# Patient Record
Sex: Female | Born: 1950 | Race: White | Hispanic: No | Marital: Married | Smoking: Former smoker
Health system: Southern US, Community
[De-identification: ages and names within clinical notes are randomized; demographics above are authoritative.]

## PROBLEM LIST (undated history)

## (undated) DIAGNOSIS — R112 Nausea with vomiting, unspecified: Secondary | ICD-10-CM

## (undated) DIAGNOSIS — D580 Hereditary spherocytosis: Secondary | ICD-10-CM

## (undated) DIAGNOSIS — J040 Acute laryngitis: Secondary | ICD-10-CM

## (undated) DIAGNOSIS — I10 Essential (primary) hypertension: Secondary | ICD-10-CM

## (undated) DIAGNOSIS — T7840XA Allergy, unspecified, initial encounter: Secondary | ICD-10-CM

## (undated) DIAGNOSIS — M1990 Unspecified osteoarthritis, unspecified site: Secondary | ICD-10-CM

## (undated) DIAGNOSIS — I712 Thoracic aortic aneurysm, without rupture, unspecified: Secondary | ICD-10-CM

## (undated) DIAGNOSIS — Z9889 Other specified postprocedural states: Secondary | ICD-10-CM

## (undated) DIAGNOSIS — K219 Gastro-esophageal reflux disease without esophagitis: Secondary | ICD-10-CM

## (undated) DIAGNOSIS — R29898 Other symptoms and signs involving the musculoskeletal system: Secondary | ICD-10-CM

## (undated) DIAGNOSIS — G709 Myoneural disorder, unspecified: Secondary | ICD-10-CM

## (undated) DIAGNOSIS — N200 Calculus of kidney: Secondary | ICD-10-CM

## (undated) DIAGNOSIS — K5190 Ulcerative colitis, unspecified, without complications: Secondary | ICD-10-CM

## (undated) DIAGNOSIS — K921 Melena: Secondary | ICD-10-CM

## (undated) DIAGNOSIS — I351 Nonrheumatic aortic (valve) insufficiency: Secondary | ICD-10-CM

## (undated) DIAGNOSIS — M659 Synovitis and tenosynovitis, unspecified: Secondary | ICD-10-CM

## (undated) DIAGNOSIS — R011 Cardiac murmur, unspecified: Secondary | ICD-10-CM

## (undated) DIAGNOSIS — G454 Transient global amnesia: Secondary | ICD-10-CM

## (undated) DIAGNOSIS — E785 Hyperlipidemia, unspecified: Secondary | ICD-10-CM

## (undated) DIAGNOSIS — I639 Cerebral infarction, unspecified: Secondary | ICD-10-CM

## (undated) DIAGNOSIS — C4491 Basal cell carcinoma of skin, unspecified: Secondary | ICD-10-CM

## (undated) DIAGNOSIS — G4730 Sleep apnea, unspecified: Secondary | ICD-10-CM

## (undated) HISTORY — PX: SPLENECTOMY, TOTAL: SHX788

## (undated) HISTORY — DX: Sleep apnea, unspecified: G47.30

## (undated) HISTORY — DX: Other symptoms and signs involving the musculoskeletal system: R29.898

## (undated) HISTORY — DX: Hyperlipidemia, unspecified: E78.5

## (undated) HISTORY — DX: Basal cell carcinoma of skin, unspecified: C44.91

## (undated) HISTORY — PX: OTHER SURGICAL HISTORY: SHX169

## (undated) HISTORY — DX: Cardiac murmur, unspecified: R01.1

## (undated) HISTORY — DX: Acute laryngitis: J04.0

## (undated) HISTORY — DX: Allergy, unspecified, initial encounter: T78.40XA

## (undated) HISTORY — DX: Calculus of kidney: N20.0

## (undated) HISTORY — DX: Other specified postprocedural states: Z98.890

## (undated) HISTORY — DX: Ulcerative colitis, unspecified, without complications: K51.90

## (undated) HISTORY — DX: Melena: K92.1

## (undated) HISTORY — DX: Nausea with vomiting, unspecified: R11.2

## (undated) HISTORY — DX: Gastro-esophageal reflux disease without esophagitis: K21.9

## (undated) HISTORY — DX: Transient global amnesia: G45.4

---

## 1999-04-11 ENCOUNTER — Other Ambulatory Visit: Admission: RE | Admit: 1999-04-11 | Discharge: 1999-04-11 | Payer: Self-pay | Admitting: Obstetrics & Gynecology

## 1999-07-08 HISTORY — PX: TOTAL KNEE ARTHROPLASTY: SHX125

## 2000-07-28 ENCOUNTER — Other Ambulatory Visit: Admission: RE | Admit: 2000-07-28 | Discharge: 2000-07-28 | Payer: Self-pay | Admitting: Obstetrics & Gynecology

## 2002-03-03 ENCOUNTER — Other Ambulatory Visit: Admission: RE | Admit: 2002-03-03 | Discharge: 2002-03-03 | Payer: Self-pay | Admitting: Obstetrics & Gynecology

## 2002-11-16 ENCOUNTER — Encounter: Payer: Self-pay | Admitting: Internal Medicine

## 2002-11-16 ENCOUNTER — Encounter: Admission: RE | Admit: 2002-11-16 | Discharge: 2002-11-16 | Payer: Self-pay | Admitting: Internal Medicine

## 2004-02-01 ENCOUNTER — Other Ambulatory Visit: Admission: RE | Admit: 2004-02-01 | Discharge: 2004-02-01 | Payer: Self-pay | Admitting: Obstetrics & Gynecology

## 2004-04-24 ENCOUNTER — Emergency Department (HOSPITAL_COMMUNITY): Admission: EM | Admit: 2004-04-24 | Discharge: 2004-04-24 | Payer: Self-pay | Admitting: Emergency Medicine

## 2004-04-29 ENCOUNTER — Ambulatory Visit (HOSPITAL_COMMUNITY): Admission: RE | Admit: 2004-04-29 | Discharge: 2004-04-29 | Payer: Self-pay | Admitting: Emergency Medicine

## 2004-05-09 ENCOUNTER — Ambulatory Visit: Payer: Self-pay

## 2004-06-06 ENCOUNTER — Ambulatory Visit: Payer: Self-pay | Admitting: Internal Medicine

## 2004-06-20 ENCOUNTER — Ambulatory Visit: Payer: Self-pay | Admitting: Internal Medicine

## 2005-02-13 ENCOUNTER — Encounter: Admission: RE | Admit: 2005-02-13 | Discharge: 2005-02-13 | Payer: Self-pay | Admitting: Internal Medicine

## 2005-06-19 ENCOUNTER — Other Ambulatory Visit: Admission: RE | Admit: 2005-06-19 | Discharge: 2005-06-19 | Payer: Self-pay | Admitting: Obstetrics & Gynecology

## 2006-03-18 ENCOUNTER — Ambulatory Visit: Payer: Self-pay | Admitting: Cardiovascular Disease

## 2006-03-26 ENCOUNTER — Ambulatory Visit: Payer: Self-pay

## 2006-03-31 ENCOUNTER — Ambulatory Visit: Payer: Self-pay | Admitting: Cardiovascular Disease

## 2006-06-01 ENCOUNTER — Inpatient Hospital Stay (HOSPITAL_COMMUNITY): Admission: RE | Admit: 2006-06-01 | Discharge: 2006-06-04 | Payer: Self-pay | Admitting: Orthopedic Surgery

## 2007-07-08 DIAGNOSIS — G454 Transient global amnesia: Secondary | ICD-10-CM

## 2007-07-08 HISTORY — DX: Transient global amnesia: G45.4

## 2007-09-21 ENCOUNTER — Ambulatory Visit: Payer: Self-pay | Admitting: Cardiology

## 2007-09-21 ENCOUNTER — Ambulatory Visit: Payer: Self-pay | Admitting: Surgery

## 2007-09-21 ENCOUNTER — Encounter (INDEPENDENT_AMBULATORY_CARE_PROVIDER_SITE_OTHER): Payer: Self-pay | Admitting: Internal Medicine

## 2007-09-21 ENCOUNTER — Inpatient Hospital Stay (HOSPITAL_COMMUNITY): Admission: EM | Admit: 2007-09-21 | Discharge: 2007-09-24 | Payer: Self-pay | Admitting: Emergency Medicine

## 2007-09-21 DIAGNOSIS — I639 Cerebral infarction, unspecified: Secondary | ICD-10-CM

## 2007-09-21 HISTORY — DX: Cerebral infarction, unspecified: I63.9

## 2007-09-23 ENCOUNTER — Encounter: Payer: Self-pay | Admitting: Cardiology

## 2007-09-27 ENCOUNTER — Ambulatory Visit: Payer: Self-pay | Admitting: Cardiology

## 2007-12-18 ENCOUNTER — Ambulatory Visit: Payer: Self-pay | Admitting: Internal Medicine

## 2007-12-18 ENCOUNTER — Inpatient Hospital Stay (HOSPITAL_COMMUNITY): Admission: EM | Admit: 2007-12-18 | Discharge: 2007-12-21 | Payer: Self-pay | Admitting: Emergency Medicine

## 2007-12-19 ENCOUNTER — Encounter (INDEPENDENT_AMBULATORY_CARE_PROVIDER_SITE_OTHER): Payer: Self-pay | Admitting: Gastroenterology

## 2007-12-19 ENCOUNTER — Encounter (INDEPENDENT_AMBULATORY_CARE_PROVIDER_SITE_OTHER): Payer: Self-pay | Admitting: *Deleted

## 2007-12-20 ENCOUNTER — Encounter (INDEPENDENT_AMBULATORY_CARE_PROVIDER_SITE_OTHER): Payer: Self-pay | Admitting: *Deleted

## 2007-12-21 ENCOUNTER — Encounter (INDEPENDENT_AMBULATORY_CARE_PROVIDER_SITE_OTHER): Payer: Self-pay | Admitting: *Deleted

## 2008-05-03 ENCOUNTER — Ambulatory Visit: Payer: Self-pay | Admitting: General Practice

## 2008-07-07 HISTORY — PX: SHOULDER ARTHROSCOPY W/ ROTATOR CUFF REPAIR: SHX2400

## 2008-11-02 ENCOUNTER — Inpatient Hospital Stay (HOSPITAL_COMMUNITY): Admission: RE | Admit: 2008-11-02 | Discharge: 2008-11-03 | Payer: Self-pay | Admitting: Orthopedic Surgery

## 2009-03-13 ENCOUNTER — Telehealth: Payer: Self-pay | Admitting: Internal Medicine

## 2009-03-20 DIAGNOSIS — E785 Hyperlipidemia, unspecified: Secondary | ICD-10-CM

## 2009-03-20 DIAGNOSIS — E782 Mixed hyperlipidemia: Secondary | ICD-10-CM | POA: Insufficient documentation

## 2009-03-20 DIAGNOSIS — K922 Gastrointestinal hemorrhage, unspecified: Secondary | ICD-10-CM | POA: Insufficient documentation

## 2009-03-20 DIAGNOSIS — D72829 Elevated white blood cell count, unspecified: Secondary | ICD-10-CM | POA: Insufficient documentation

## 2009-03-20 DIAGNOSIS — I6350 Cerebral infarction due to unspecified occlusion or stenosis of unspecified cerebral artery: Secondary | ICD-10-CM | POA: Insufficient documentation

## 2009-03-20 DIAGNOSIS — I10 Essential (primary) hypertension: Secondary | ICD-10-CM | POA: Insufficient documentation

## 2009-03-20 DIAGNOSIS — K5730 Diverticulosis of large intestine without perforation or abscess without bleeding: Secondary | ICD-10-CM | POA: Insufficient documentation

## 2009-03-21 ENCOUNTER — Ambulatory Visit: Payer: Self-pay | Admitting: Internal Medicine

## 2009-03-21 DIAGNOSIS — R109 Unspecified abdominal pain: Secondary | ICD-10-CM | POA: Insufficient documentation

## 2009-03-21 LAB — CONVERTED CEMR LAB
AST: 22 units/L (ref 0–37)
Alkaline Phosphatase: 96 units/L (ref 39–117)
Amylase: 74 units/L (ref 27–131)
Basophils Relative: 0.5 % (ref 0.0–3.0)
Eosinophils Absolute: 0.6 10*3/uL (ref 0.0–0.7)
HCT: 42.5 % (ref 36.0–46.0)
Lipase: 15 units/L (ref 11.0–59.0)
Lymphs Abs: 3 10*3/uL (ref 0.7–4.0)
MCV: 91.1 fL (ref 78.0–100.0)
Monocytes Absolute: 1.3 10*3/uL — ABNORMAL HIGH (ref 0.1–1.0)
Monocytes Relative: 12.8 % — ABNORMAL HIGH (ref 3.0–12.0)
Neutro Abs: 5.1 10*3/uL (ref 1.4–7.7)
Platelets: 428 10*3/uL — ABNORMAL HIGH (ref 150.0–400.0)
Total Bilirubin: 1.7 mg/dL — ABNORMAL HIGH (ref 0.3–1.2)
WBC: 10.1 10*3/uL (ref 4.5–10.5)

## 2009-04-04 ENCOUNTER — Ambulatory Visit (HOSPITAL_COMMUNITY): Admission: RE | Admit: 2009-04-04 | Discharge: 2009-04-04 | Payer: Self-pay | Admitting: Internal Medicine

## 2009-04-18 ENCOUNTER — Ambulatory Visit (HOSPITAL_COMMUNITY): Admission: RE | Admit: 2009-04-18 | Discharge: 2009-04-18 | Payer: Self-pay | Admitting: Internal Medicine

## 2009-07-07 ENCOUNTER — Observation Stay (HOSPITAL_COMMUNITY): Admission: EM | Admit: 2009-07-07 | Discharge: 2009-07-08 | Payer: Self-pay | Admitting: Emergency Medicine

## 2009-07-07 ENCOUNTER — Encounter (INDEPENDENT_AMBULATORY_CARE_PROVIDER_SITE_OTHER): Payer: Self-pay | Admitting: Internal Medicine

## 2009-07-16 ENCOUNTER — Ambulatory Visit: Payer: Self-pay

## 2009-07-16 ENCOUNTER — Encounter: Payer: Self-pay | Admitting: Cardiovascular Disease

## 2009-07-16 ENCOUNTER — Ambulatory Visit (HOSPITAL_COMMUNITY): Admission: RE | Admit: 2009-07-16 | Discharge: 2009-07-16 | Payer: Self-pay | Admitting: Cardiovascular Disease

## 2009-07-16 ENCOUNTER — Ambulatory Visit: Payer: Self-pay | Admitting: Cardiology

## 2009-07-18 ENCOUNTER — Telehealth: Payer: Self-pay | Admitting: Cardiovascular Disease

## 2009-07-18 DIAGNOSIS — I059 Rheumatic mitral valve disease, unspecified: Secondary | ICD-10-CM | POA: Insufficient documentation

## 2009-07-19 DIAGNOSIS — R51 Headache: Secondary | ICD-10-CM

## 2009-07-19 DIAGNOSIS — D649 Anemia, unspecified: Secondary | ICD-10-CM | POA: Insufficient documentation

## 2009-07-19 DIAGNOSIS — R413 Other amnesia: Secondary | ICD-10-CM | POA: Insufficient documentation

## 2009-07-19 DIAGNOSIS — R519 Headache, unspecified: Secondary | ICD-10-CM | POA: Insufficient documentation

## 2009-07-19 DIAGNOSIS — D580 Hereditary spherocytosis: Secondary | ICD-10-CM

## 2009-07-19 DIAGNOSIS — K219 Gastro-esophageal reflux disease without esophagitis: Secondary | ICD-10-CM | POA: Insufficient documentation

## 2009-07-23 ENCOUNTER — Ambulatory Visit: Payer: Self-pay | Admitting: Cardiovascular Disease

## 2010-02-15 IMAGING — NM NM HEPATO W/GB/PHARM/[PERSON_NAME]
2 series · 12 of 12 positions shown · non-contrast
Comparison: Ultrasound 04/04/2001

CLINICAL DATA: History of abdominal pain.

NUCLEAR MEDICINE HEPATOBILIARY IMAGING WITH GALLBLADDER EF
TECHNIQUE: Sequential images of the abdomen were obtained [DATE]
minutes following intravenous administration of
radiopharmaceutical. After slow intravenous infusion of 1.4 uCg
Cholecystokinin, gallbladder ejection fraction was determined.
Radiopharmaceutical: 5.3 mCi 4c-22m Choletec

[Series 1: he hepato · 4.71mm/px · 6 of 30 frames shown (1 of 2)]
[frame 3/30]
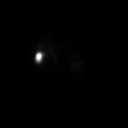
[frame 8/30]
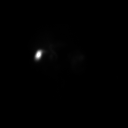
[frame 13/30]
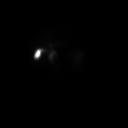
[frame 18/30]
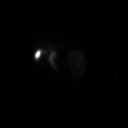
[frame 23/30]
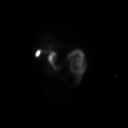
[frame 28/30]
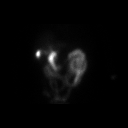

[Series 1: he hepato · 4.71mm/px · 6 of 60 frames shown (2 of 2)]
[frame 6/60]
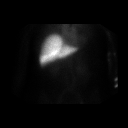
[frame 16/60]
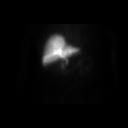
[frame 26/60]
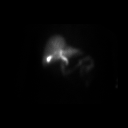
[frame 36/60]
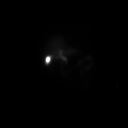
[frame 46/60]
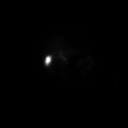
[frame 56/60]
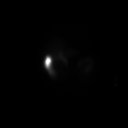

[12 of 12 positions shown; findings below may reference images not displayed]

FINDINGS: There is prompt visualization of hepatic activity. There
is prompt visualization of the common bile duct. Subsequently
intestinal activity was identified.

The gallbladder began to visualize at 25 minutes.

During CCK infusion the gallbladder contracted 89%. 30% or greater
is normal range.

During CCK infusion the patient reported experiencing mild
abdominal pain and burning.
IMPRESSION: There is demonstration of patency of the common bile duct and the
cystic duct. There is no evidence of cholecystitis.

During CCK infusion the gallbladder contracted 89%. 30% or greater
is normal range.

During CCK infusion the patient reported experiencing mild
abdominal pain and burning.

## 2010-08-06 NOTE — Progress Notes (Signed)
Summary: PT RETURNING CALL  Phone Note Call from Patient Call back at [facility] Phone [phone]   Caller: Patient Summary of Call: PT RETURNING CALL. Initial call taken by: Delsa Sale,  July 18, 2009 1:09 PM  Follow-up for Phone Call        PT AWARE OF ECHO RESULTS. Follow-up by: Devra Dopp, LPN,  July 19, 2955 1:23 PM

## 2010-08-06 NOTE — Assessment & Plan Note (Signed)
Summary: EPH   Visit Type:  Post-hospital Primary Provider:  Jeremy Johann, MD  CC:  dizziness- nausea.  History of Present Illness: 60 year-old woman with history of mitral valve prolapse presents for hospital follow-up evaluation today. She was recently hospitalized with an episode of Transient Global Amnesia. Imaging studies showed no acute ischemic events. She has had no recent cardiac symptoms. Specifically denies chest pain, edema, palps, or syncope. She reports chronic exertional dyspnea. No other complaints today. She is anxious to return to work but is waiting for neurologic clearance. - Current Medications (verified): 1)  Aspirin 81 Mg Tbec (Aspirin) .... Once Daily 2)  Panothetic Acid 524m .... Once Daily 3)  Metoprolol Succinate 50 Mg Xr24h-Tab (Metoprolol Succinate) .... Take One Tablet By Mouth Daily 4)  Vitamin B-1 250 Mg Tabs (Thiamine Hcl) .... Take 1 Tablet By Mouth Once A Day  Allergies: 1)  ! Codeine  Past History:  Past medical history reviewed for relevance to current acute and chronic problems.  Past Medical History: Reviewed history from 03/20/2009 and no changes required. Current Problems:  Hx of CVA (STROKE) (ICD-434.91) LEUKOCYTOSIS (ICD-288.60) HYPERLIPIDEMIA (ICD-272.4) HYPERTENSION (ICD-401.9) DIVERTICULOSIS, COLON (ICD-562.10) Hx of GI BLEEDING (ICD-578.9) GASTRITIS, CHRONIC (ICD-535.10) HIATAL HERNIA (ICD-553.3)    Review of Systems       Negative except as per HPI   Vital Signs:  Patient profile:   60year old female Height:      64 inches Weight:      167.50 pounds BMI:     28.86 Pulse rate:   58 / minute Pulse rhythm:   irregular Resp:     18 per minute BP sitting:   140 / 84  (left arm) Cuff size:   large  Vitals Entered By: LSidney Ace(July 23, 2009 10:01 AM)  Physical Exam  General:  Pt is alert and oriented, in no acute distress. HEENT: normal Neck: normal carotid upstrokes without bruits, JVP normal Lungs:  CTA CV: RRR without murmur or gallop Abd: soft, NT, positive BS, no bruit, no organomegaly Ext: no clubbing, cyanosis, or edema. peripheral pulses 2+ and equal Skin: warm and dry without rash    EKG  Procedure date:  07/23/2009  Findings:      Sinus bradycardia, low voltage, nonspecific Twave abnormality with diffuse T wave flattening.  Impression & Recommendations:  Problem # 1:  MITRAL VALVE PROLAPSE (ICD-424.0) Recent echo reviewed and showed MVP without regurgitation. LV function is normal and no other valvular abnormalities are noted. Continue beta blockade.  The following medications were removed from the medication list:    Metoprolol Succinate 100 Mg Xr24h-tab (Metoprolol succinate) ..... Once daily Her updated medication list for this problem includes:    Metoprolol Succinate 50 Mg Xr24h-tab (Metoprolol succinate) ..Marland Kitchen.. Take one tablet by mouth daily  Problem # 2:  HYPERTENSION (ICD-401.9) continue metoprolol.  The following medications were removed from the medication list:    Metoprolol Succinate 100 Mg Xr24h-tab (Metoprolol succinate) ..... Once daily Her updated medication list for this problem includes:    Aspirin 81 Mg Tbec (Aspirin) ..... Once daily    Metoprolol Succinate 50 Mg Xr24h-tab (Metoprolol succinate) ..Marland Kitchen.. Take one tablet by mouth daily  BP today: 140/84 Prior BP: 138/90 (03/21/2009)  Patient Instructions: 1)  Your physician recommends that you schedule a follow-up appointment in:  follow up as needed

## 2010-09-22 LAB — DIFFERENTIAL
Basophils Absolute: 0.1 10*3/uL (ref 0.0–0.1)
Basophils Relative: 1 % (ref 0–1)
Eosinophils Absolute: 0.3 10*3/uL (ref 0.0–0.7)
Neutro Abs: 8.3 10*3/uL — ABNORMAL HIGH (ref 1.7–7.7)

## 2010-09-22 LAB — CK TOTAL AND CKMB (NOT AT ARMC)
Relative Index: 1.7 (ref 0.0–2.5)
Total CK: 126 U/L (ref 7–177)

## 2010-09-22 LAB — CBC: MCV: 92.4 fL (ref 78.0–100.0)

## 2010-09-22 LAB — CARDIAC PANEL(CRET KIN+CKTOT+MB+TROPI)
CK, MB: 1.3 ng/mL (ref 0.3–4.0)
CK, MB: 1.8 ng/mL (ref 0.3–4.0)
Relative Index: 1.6 (ref 0.0–2.5)
Total CK: 95 U/L (ref 7–177)
Troponin I: 0.01 ng/mL (ref 0.00–0.06)

## 2010-09-22 LAB — TROPONIN I: Troponin I: 0.03 ng/mL (ref 0.00–0.06)

## 2010-09-22 LAB — COMPREHENSIVE METABOLIC PANEL
ALT: 22 U/L (ref 0–35)
Albumin: 3.8 g/dL (ref 3.5–5.2)
Alkaline Phosphatase: 76 U/L (ref 39–117)
Chloride: 105 mEq/L (ref 96–112)
Creatinine, Ser: 0.68 mg/dL (ref 0.4–1.2)
GFR calc Af Amer: >60 mL/min (ref >60)
Glucose, Bld: 99 mg/dL (ref 70–99)
Sodium: 138 mEq/L (ref 135–145)
Total Bilirubin: 0.9 mg/dL (ref 0.3–1.2)

## 2010-10-16 LAB — COMPREHENSIVE METABOLIC PANEL
ALT: 18 U/L (ref 0–35)
Alkaline Phosphatase: 91 U/L (ref 39–117)
BUN: 10 mg/dL (ref 6–23)
CO2: 31 mEq/L (ref 19–32)
GFR calc non Af Amer: >60 mL/min (ref >60)
Glucose, Bld: 90 mg/dL (ref 70–99)
Potassium: 5.3 mEq/L — ABNORMAL HIGH (ref 3.5–5.1)
Sodium: 142 mEq/L (ref 135–145)
Total Bilirubin: 0.9 mg/dL (ref 0.3–1.2)

## 2010-10-16 LAB — URINE CULTURE: Special Requests: NEGATIVE

## 2010-10-16 LAB — BASIC METABOLIC PANEL
CO2: 29 mEq/L (ref 19–32)
Chloride: 99 mEq/L (ref 96–112)
GFR calc Af Amer: >60 mL/min (ref >60)
Sodium: 133 mEq/L — ABNORMAL LOW (ref 135–145)

## 2010-10-16 LAB — URINE MICROSCOPIC-ADD ON

## 2010-10-16 LAB — CBC
HCT: 42 % (ref 36.0–46.0)
Hemoglobin: 13.4 g/dL (ref 12.0–15.0)
Hemoglobin: 15.1 g/dL — ABNORMAL HIGH (ref 12.0–15.0)
MCHC: 35.7 g/dL (ref 30.0–36.0)
MCV: 93.2 fL (ref 78.0–100.0)
RBC: 4.03 MIL/uL (ref 3.87–5.11)
RBC: 4.52 MIL/uL (ref 3.87–5.11)

## 2010-10-16 LAB — DIFFERENTIAL
Basophils Absolute: 0.2 10*3/uL — ABNORMAL HIGH (ref 0.0–0.1)
Eosinophils Absolute: 0.4 10*3/uL (ref 0.0–0.7)
Lymphocytes Relative: 33 % (ref 12–46)
Monocytes Absolute: 0.8 10*3/uL (ref 0.1–1.0)
Neutrophils Relative %: 49 % (ref 43–77)

## 2010-10-16 LAB — URINALYSIS, ROUTINE W REFLEX MICROSCOPIC
Ketones, ur: NEGATIVE mg/dL
Nitrite: NEGATIVE
Specific Gravity, Urine: 1.004 — ABNORMAL LOW (ref 1.005–1.030)
pH: 7 (ref 5.0–8.0)

## 2010-10-16 LAB — PROTIME-INR: Prothrombin Time: 13.5 seconds (ref 11.6–15.2)

## 2010-10-16 LAB — CROSSMATCH

## 2010-10-22 ENCOUNTER — Other Ambulatory Visit: Payer: Self-pay | Admitting: Internal Medicine

## 2010-11-04 ENCOUNTER — Other Ambulatory Visit: Payer: Self-pay | Admitting: Otolaryngology

## 2010-11-04 ENCOUNTER — Ambulatory Visit
Admission: RE | Admit: 2010-11-04 | Discharge: 2010-11-04 | Disposition: A | Discharge status: Home / Self Care | Payer: BC Managed Care – PPO | Source: Ambulatory Visit | Attending: Otolaryngology | Admitting: Otolaryngology

## 2010-11-04 DIAGNOSIS — J40 Bronchitis, not specified as acute or chronic: Secondary | ICD-10-CM

## 2010-11-19 NOTE — Discharge Summary (Signed)
NAMEELSBETH, YEARICK               ACCOUNT NO.:  [id number]   MEDICAL RECORD NO.:  [id number]          PATIENT TYPE:  INP   LOCATION:  3159                         FACILITY:  [facility]   PHYSICIAN:  Farris Has, MDDATE OF BIRTH:  1951/02/24   DATE OF ADMISSION:  12/18/2007  DATE OF DISCHARGE:  12/21/2007                               DISCHARGE SUMMARY   CONSULTATION:  Jeryl Columbia, M.D. with GI.   DISCHARGE DIAGNOSES:  1. Upper gastrointestinal bleed secondary to nonsteroidal      antiinflammatory drug-induced gastric ulcer.  2. Chronic headache.  3. Hypertension.  4. History of cerebrovascular accident.  5. Hyperlipidemia.  6. Leucocytosis.   STUDIES:  The patient had upper endoscopy performed on December 09, 2007,  showing a small hiatal hernia and few antral ulcers which were sent down  for H. pylori but this result was not back at this point.   HOSPITAL COURSE:  The patient is a pleasant 60 year old female with  history of CVA on Plavix and also was taking NSAID for headache.  The  patient was admitted with hematemesis by [facility], and there she was placed  on telemetry and hydrated.  GI consult called.  ECG performed on October 11, 2007, showed __________ .  She was placed on Protonix 40 mg p.o.  b.i.d.  Follow up with Dr. Watt Climes in about 1 month.  Of note, the patient  was also noted to have headache which was thought to be secondary  __________  versus a chronic headache.  The patient was given Ultram  with great relief and at this point is headache free.  The patient is to  follow up by her primary care doctor for further care.  The patient to  avoid using Excedrin because it contains aspirin.   Anemia likely secondary to upper GI bleed.  We will repeat CBC as an  outpatient.  Call results to Dr. Tamala Julian.  We will discharge her on  ferrous gluconate.   Hypertension:  The patient was normotensive or a low blood pressure  while in-house.  We will decrease her Toprol to 25 mg  p.o. daily.   Hyperlipidemia.  Continue her Lipitor.   Leukocytosis:  At the time of discharge, the patient was found to have  mild leukocytosis at 11.4.  The patient was asymptomatic.  Did not show  absolutely any signs of infection.  We will repeat CBC in about 1 week  or asked the patient to present to primary care for followup.  The  patient is asymptomatic.   Hyperkalemia:  The patient has had her potassium replaced while in-house  because of  hyperkalemia with fluid rehydration.   DISCHARGE MEDICATIONS:  1. Toprol 25 mg p.o. daily.  2. Ferrous gluconate 325 mg p.o. b.i.d.  3. Colace 100 mg p.o. b.i.d.  4. Ultram 50 mg p.o. q.6 h as needed for pain.  5. Protonix 40 mg p.o. b.i.d.   The patient is to continue B1 and B12 and to continue her potassium  supplements.      Farris Has, MD  Electronically Signed  AVD/MEDQ  D:  12/21/2007  T:  12/22/2007  Job:  [id number]   cc:   Judithann Sauger, M.D.  Jeryl Columbia, M.D.

## 2010-11-19 NOTE — Consult Note (Signed)
Hannah Willis, Hannah Willis               ACCOUNT NO.:  [id number]   MEDICAL RECORD NO.:  [id number]          PATIENT TYPE:  INP   LOCATION:  3106                         FACILITY:  [facility]   PHYSICIAN:  Legrand Como L. Reynolds, M.D.DATE OF BIRTH:  [birth year]   DATE OF CONSULTATION:  09/21/2007  DATE OF DISCHARGE:                                 CONSULTATION   REASON FOR CONSULTATION:  Right arm weakness, question TIA/CVA.   HISTORY OF PRESENT ILLNESS:  This is a 60 year old white woman with a  history of vitamin D deficiency and hypertension, who presents with  acute onset of right arm weakness and paresthesia that began at 2 a.m.  She was last normal at 10 p.m. when she went to bed last night.  She  woke up and noticed that she had numbness and a burning sensation over  her entire right arm.  An EMT neighbor took the blood pressure, which  was found to be 180/120 and urged her to come into the emergency  department for evaluation.   ALLERGIES:  CODEINE.   PAST MEDICAL HISTORY:  1. Hypertension.  2. Prior right basal ganglia stroke, of which the patient was unaware.  3. History of spherocytosis.   HOME MEDICATIONS:  1. Calcium.  2. Vitamin D tablets.  3. Toprol XL 50 mg twice daily.   CURRENT MEDICATIONS:  1. Aspirin 325 mg daily.  2. Lovenox 40 mg daily.  3. Toprol 50 mg  twice daily.  4. Tylenol.  5. Ativan.  6. Maalox.  7. Senokot.   SOCIAL HISTORY:  She lives with her husband and is a former smoker.  Quit 18 years ago.  Denies any alcohol or illicit drug use. 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   FAMILY HISTORY:  Father with hypertension and diabetes.  Mother just  died yesterday of a stroke.   REVIEW OF SYSTEMS:  She denies any headache, vertigo, diplopia,  dizziness, chest pain, shortness of breath, abdominal pain, dysuria,  melena or hematochezia.   PHYSICAL EXAMINATION:  VITAL SIGNS:  Blood pressure 118/60, temperature  97.4 degrees, heart  rate 65, respirations 18, O2 saturation 98% on room  air.  GENERAL:  Alert, awake, oriented x3, in no acute distress.  HEENT/NECK:  No bruits or goiter.  CARDIOVASCULAR:  A regular rate and rhythm with no murmurs, rubs or  gallops.  LUNGS:  Clear to auscultation bilaterally.  ABDOMEN:  Soft, nontender, nondistended, with positive bowel sounds.  EXTREMITIES:  No edema.  Positive pulses.  NEUROLOGIC:  Mental status intact.  Cranial nerves II-XII  intact.  Muscle strength 5/5 in the left upper extremity, left lower extremity,  right lower extremity; however, she has decreased grip strength on the  right.  She has her right breast stroke.  She has inability to extend  her right fourth and fifth fingers.  She has good sensation to pinprick  over all 10 fingertips.  She has intact triceps and biceps reflex on the  right, slightly decreased brachial radialis reflex on the right.  She  cannot abduct her right  shoulder to greater than 90 degrees.  She has a  right ankle boot, secondary to stress fracture.  Finger-to-nose was  normal on the left, difficulty on the right, given weakness.   LABORATORY DATA:  Sodium 136, potassium 3.7, chloride 104, bicarbonate  24, BUN 14, creatinine 0.74, glucose 114.  WBCs 8.8, hemoglobin 13.4,  platelets 358.  INR 1, PTT 34.  AST 17, ALT 13, total protein 6.3,  albumin 3.5, calcium 8.9.  CK 130, CK-MB 1.9, troponin 0.01.  Cholesterol 178, triglycerides 51, HDL 54, LDL slightly elevated at 114.   Chest x-ray with no active disease.  A CT of the head with no acute  intracranial abnormalities.  No acute infarction.  Chronic right basilar  ganglia fresh lacunar CVA.  MRI shows acute left parietal CVA.   A 2-D echocardiogram is pending as well as carotid Dopplers.   ASSESSMENT/PLAN:  This is a 60 year old white woman with a history of  hypertension and previous cerebrovascular accident, presenting with new  onset right upper extremity weakness:  I agree with the  stroke workup  including carotid Dopplers and 2-D echocardiogram, given prior  cerebrovascular accident.  Will start her on a statin with a goal LDL of  less than 70.  Agree with aspirin as well, given that she was not  previously on it.  She will need PT and OT.  We will discharge her out  of the neuro intensive care unit and this will be dictated by her rehab.      Domingo Mend, M.D.  Electronically Signed      Audrie Lia. Doy Mince, M.D.  Electronically Signed    EH/MEDQ  D:  09/21/2007  T:  09/22/2007  Job:  [id number]

## 2010-11-19 NOTE — Op Note (Signed)
Hannah Willis, Hannah Willis               ACCOUNT NO.:  [id number]   MEDICAL RECORD NO.:  [id number]          PATIENT TYPE:  INP   LOCATION:  [place]                         FACILITY:  [facility]   PHYSICIAN:  John L. Rendall, M.D.  DATE OF BIRTH:  03/29/1951   DATE OF PROCEDURE:  11/02/2008  DATE OF DISCHARGE:                               OPERATIVE REPORT   PREOPERATIVE DIAGNOSIS:  Osteoarthritis right shoulder.   POSTOPERATIVE DIAGNOSIS:  Osteoarthritis right shoulder.   SURGICAL PROCEDURES:  Hemiarthroplasty right shoulder.   SURGEON:  John L. Rendall, M.D.   ASSISTANT:  Vonita Moss. Duffy, P.A. present and participating in the entire  procedure.   ANESTHESIA:  General with scalene block.   PATHOLOGY:  The patient has osteoarthritis with deformity of the humeral  head, areas of hyaline cartilage loss, significant spurring along the  anterior and inferior margin, acetabulum was relatively unscathed by the  arthritis and the labrum and rotator cuff appear intact.   PROCEDURE IN DETAIL:  Under general anesthesia with a scalene block the  right shoulder was prepped and draped in a semi-sitting position on the  shoulder frame table.  The anatomic landmarks were identified with  marking pen and an incision from the region of the coracoid to the  deltoid insertion was made.  Dissection was carried through skin and  subcutaneous tissue exposing the deltoid and pectoral muscle bellies and  the interval between them.  The cephalic vein is found and dissected  along the lateral border.  Dissection is carried down to the  clavipectoral fascia and the deltoid is then swept anteriorly and  laterally off the clavipectoral fascia.  Scoville retractors are  inserted under the deltoid and under the short head of the biceps and  coracobrachialis.  At this point the clavipectoral fascia was taken down  on the front of the subscapularis and the muscle and tendon and capsule  are taken in one layer a centimeter  from insertion on the humerus and  taken down with electrocautery and tagged with stitches.  The humeral  head was then dislocated anteriorly.  Using a guide with 30 degrees of  retroversion the humeral head was then osteotomized at its base and  spurs were then taken off the remaining stump of the humeral neck area.  Once this was completed reaming of the humerus was done with 6,8, 10 and  12 and the 12 gives excellent fit.  Rasping was then done with 8, 10 and  12.  Trialing of different shoulder balls are then done and the optimal  size was a 44 x 21 eccentric head.  This was found to have anterior-  posterior mobility of about 50% but appeared stable in the shoulder and  at this point permanent components were obtained.  #1 Tycron sutures  were placed on the medial humeral neck and the permanent [employer]  Advantage stem standard size 12 was inserted down the canal.  It was an  excellent fit with the eccentric head.  The articular surface was  covered nicely and the shoulder was then reduced.  The subscapularis was  then drawn out with its tag sutures and the four sutures placed in the  humeral neck area through bone are then all tied and the interval was  closed.  Gentle motion reveals slight stiffness  in external rotation at 25-30 degrees but otherwise excellent mobility.  The deltopectoral interval was loosely closed with zero Vicryl, subcu  with 0 and 2-0 Vicryl and skin with clips.  Operative time under 1 hour.  The patient tolerated the procedure well and returned to recovery in  good condition.  Blood loss probably only 150 mL.      John L. Rendall, M.D.  Electronically Signed     JLR/MEDQ  D:  11/02/2008  T:  11/02/2008  Job:  [id number]

## 2010-11-19 NOTE — Discharge Summary (Signed)
Hannah Willis, Hannah Willis               ACCOUNT NO.:  [id number]   MEDICAL RECORD NO.:  [id number]          PATIENT TYPE:  INP   LOCATION:  3020                         FACILITY:  [facility]   PHYSICIAN:  Sheila Oats, M.D.DATE OF BIRTH:  1951-05-22   DATE OF ADMISSION:  09/21/2007  DATE OF DISCHARGE:  09/24/2007                               DISCHARGE SUMMARY   DISCHARGE DIAGNOSES:  1. 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  2. Hypertension.  3. History of spherocytosis.  4. Status post splenectomy.  5. History of chronic headaches.  6. History of left knee replacement.  7. Dyslipidemia.   CONSULTATIONS:  1. Neurology - Dr. Leonie Man.  [facility].   PROCEDURES AND STUDIES:  1. CT scan of brain - no acute intracranial abnormalities.  Probable      chronic right lacunar basal ganglia infarct.  2. MRI/MRA - focal acute cortical infarct involving the posterior left      frontal lobe potentially along the primary motor cortex.  No      evidence of hemorrhage or mass.  Remote lacunar infarct anterior      limb right basal ganglia some old blood products potentially in the      space.  3. MRA - marked tortuosity of the distal cervical internal carotid      arteries just below the skull base bilaterally.  No focal stenosis,      aneurysm or branch vessel occlusion.  4. Transesophageal echocardiogram - no patent foramen ovale and no      blood clot.  5. 2-D echocardiogram - the ejection fraction 60-65%, no left      ventricular regional wall motion abnormalities.  Features      consistent with mild diastolic dysfunction.   BRIEF HISTORY:  The patient is a pleasant 60 year old white female with  the above listed medical problems who presented with right arm  paresthesias and weakness.  Her husband was more concerned about her  blood pressure (which was 180/120) than the weakness so he brought her  to the ER.  It was reported that the patient's mother's funeral had  been  just the day prior to this presentation and she had died of a stroke.  The patient denied any anxiety.  She is left-handed.  The ER physician  spoke with the on call neurologist who indicated that the patient was  not a candidate for thrombolytics.   Please see the full admission history and physical of September 21, 2007,  for the details of the admission physical exam as well as the laboratory  data.   HOSPITAL COURSE:  1. 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  Physical and      occupational therapy was also consulted.  Neurology was consulted      and Dr. Leonie Man saw the patient.  They recommended that a TEE be done      and the results  are as stated above.  No clot and no patent foramen      ovale.  A 2-D echo had been done prior to this and the results are      as stated above.  Following these findings Dr. Leonie Man recommended      that the patient be changed from aspirin to Plavix and this was      done and the patient is to continue Plavix upon discharge.  The      patient was placed on Lipitor by neurology as well and she is to      continue this upon discharge.  The pending labs on Ms. Noteboom at      this time are the hypercoagulable panel as well as an ANA and the      results of this are to be followed up by her primary care physician      as well as the neurologist.  The patient is also to follow up with      Dr. Leonie Man in 2-3 weeks for a transcranial Doppler emboli/bubble      study and she is to follow up with [facility] for a cardiac      event monitor as well.  The source of the embolus is unclear at      this time as the workup so far has been negative.  2. Uncontrolled hypertension - as discussed above the patient's blood      pressure prior to coming to the ER per her husband was 180/120,      although the  blood pressure noted in the ER was 149/85.  The      patient has been maintained on her Toprol 50 mg p.o. b.i.d. while      in the hospital with good blood pressure control and she is to      continue this upon discharge.  3. Dyslipidemia - the patient's total cholesterol was 178 but with an      LDL of 114.  The patient was started on Lipitor as noted above and      is to continue this upon discharge.   DISCHARGE MEDICATIONS:  1. Lipitor 40 mg p.o. daily.  2. Plavix 75 mg p.o. daily.  3. Continue Toprol 50 mg b.i.d.  4. Vitamin D with calcium.  5. The patient has been instructed to discontinue naproxen.   FOLLOWUP CARE:  1. Dr. Carol Ada in 1-2 weeks.  2. Dr. Leonie Man in 2-3 weeks for transcranial Doppler bubble/emboli      study.  3. [facility] for outpatient monitor.   DISCHARGE CONDITION:  Improved/stable.      Sheila Oats, M.D.  Electronically Signed     ACV/MEDQ  D:  09/24/2007  T:  09/24/2007  Job:  [id number]   cc:   Judithann Sauger, M.D.  Pramod P. Leonie Man, MD  Satira Sark, MD

## 2010-11-19 NOTE — H&P (Signed)
Hannah Willis, Hannah Willis NO.:  [id number]   MEDICAL RECORD NO.:  [id number]          PATIENT TYPE:  INP   LOCATION:  4431                         FACILITY:  [facility]   PHYSICIAN:  Thayer Headings, MD    DATE OF BIRTH:  Nov 06, 1950   DATE OF ADMISSION:  12/18/2007  DATE OF DISCHARGE:                              HISTORY & PHYSICAL   PRIMARY CARE PHYSICIAN:  Judithann Sauger, M.D.   CHIEF COMPLAINT:  Bloody emesis.   HISTORY OF PRESENT ILLNESS:  This is a 60 year old female with history  of a CVA 3 months ago and started on Plavix at that time, presents here  with the a day of nausea and vomiting at her work today including coffee-  ground emesis.  The patient was seen in the ED and had another episode  of coffee-ground emesis, witnessed.  The patient reports no history of  that before, no current nausea or vomiting.  The patient denies any  recent illnesses.  The patient also does report she had noticed some  blood in her stool that lasted about 2 weeks, however stopped about 1  week ago.   PAST MEDICAL HISTORY:  1. Hypertension.  2. Parasitosis.  3. History of splenectomy.  4. Chronic headaches.  5. Left total knee arthropathy.  6. CVA x2.   MEDICATIONS:  1. Lipitor.  2. Plavix 75 mg daily.  3. Toprol 50 mg b.i.d.  4. Vitamin D with calcium.   ALLERGIES:  1. CODEINE.  2. MORPHINE.   SOCIAL HISTORY:  The patient does not currently smoke, drink, or use any  drugs.  She does have a history of tobacco use,  having quit about 17  years ago.  The patient is married.   FAMILY HISTORY:  Mother had a CVA.   REVIEW OF SYSTEMS:  Negative except as per history of present illness.   PHYSICAL EXAMINATION:  VITAL SIGNS:  Temperature 98.0, pulse is 75,  respirations 18, blood pressure is 99/68.  GENERAL:  The patient is awake, alert, and oriented x3 and appears in no  acute distress.  CARDIOVASCULAR:  Regular rate and rhythm without murmur noted.  LUNGS:  Clear to  auscultation bilaterally.  ABDOMEN:  Soft, nontender, and nondistended, with positive bowel sounds  and no hepatosplenomegaly.  EXTREMITIES:  No edema.   LABORATORY DATA:  Sodium 141, potassium 4.1, chloride 107, bicarbonate  27, BUN 37, creatinine 0.66, glucose 124, WBC 10, hemoglobin of 12,  platelets 370, occult blood is negative, lipase 17.   ASSESSMENT/PLAN:  1. Coffee-ground emesis.  This is likely secondary to her Plavix      potentially causing an ulcerative process.  She does not have any      signs or symptoms of cirrhosis, including a normal INR and normal      liver function tests, platelets, and albumin.  This is likely      secondary to her Plavix.  We will hold Plavix and her blood      pressure medicines, check her hemoglobin every 4 hours, and      consider Gastroenterology  inputs at a.m.  At this point her      hemoglobin has remained stable as her discharge hemoglobin from her      previous hospitalization was 13.1, and at this time it is 12.3.  We      will monitor this closely and consider Gastrointestinal input      sooner if there are any significant changes.  Also, we will assure      that she has been typed and crossed and has adequate IV access.  2. History of cerebrovascular accident.  Also, we will consider      Neurology input in regards to her Plavix and an alternative in this      patient who likely has a side effect to her Plavix.      Thayer Headings, MD  Electronically Signed     RWC/MEDQ  D:  12/18/2007  T:  12/19/2007  Job:  [id number]

## 2010-11-19 NOTE — H&P (Signed)
Hannah Willis, Hannah Willis               ACCOUNT NO.:  [id number]   MEDICAL RECORD NO.:  [id number]          PATIENT TYPE:  EMS   LOCATION:  MAJO                         FACILITY:  [facility]   PHYSICIAN:  Corinna L. Conley Canal, MDDATE OF BIRTH:  June 08, 1951   DATE OF ADMISSION:  09/21/2007  DATE OF DISCHARGE:                              HISTORY & PHYSICAL   CHIEF COMPLAINT:  Right arm pain.   HISTORY OF PRESENT ILLNESS:  Ms. Chirco is a 60 year old white female  patient of Dr. Carol Ada who presents with the onset of right arm  paresthesia at 2:00 a.m.  She also has some weakness in that arm.  She  has no neck pain.  She has no other symptoms.  Her husband was more  concerned about her blood pressure which was 180/120, then the weakness,  and mainly brought her to the emergency room due to the elevated blood  pressure.  The patient denies any history of stroke.  She reports that  her mother's funeral was yesterday and apparently her mother died of a  stroke.  The patient denies any anxiety currently.  She is left-handed.  Dr. Roxanne Mins, ED physician, reports that he spoke with neurology on-call,  and that the patient would not be a candidate for thrombolytics   PAST MEDICAL HISTORY:  1. Hypertension.  2. Spherocytosis.  3. Status post splenectomy.  4. Chronic headaches.  5. Left knee replacement.   MEDICATIONS:  1. Vitamin D.  2. Toprol XL 50 mg p.o. b.i.d.   ALLERGIES:  SHE REPORTS AN ALLERGY TO CODEINE.   SOCIAL HISTORY:  The patient is married.  She does not drink, smoke or  use drugs.   FAMILY HISTORY:  Her mother died of a stroke   PAST SURGICAL HISTORY:  1. Splenectomy.  2. Left knee replacement.  3. Surgery on her right leg for an infection.   REVIEW OF SYSTEMS:  As above, otherwise, negative.   PHYSICAL EXAMINATION:  VITAL SIGNS:  Temperature is 98.1, blood pressure  149/85, pulse 65, respiratory rate 18 oxygen saturation 94% on room air.  GENERAL:  The patient is  well-nourished, well-developed in no acute  distress.  HEENT: Normocephalic, atraumatic.  Pupils equal, round, reactive to  light.  Sclerae nonicteric.  Moist mucous membranes.  NECK:  Supple.  No C-spine tenderness.  No carotid bruits.  No  thyromegaly.  LUNGS:  Clear to auscultation bilaterally without wheezes, rhonchi or  rales.  CARDIOVASCULAR:  Regular rate and rhythm with a 2/6 systolic murmur.  ABDOMEN:  Soft, nontender, nondistended.  GU/RECTAL:  Deferred.  EXTREMITIES: No clubbing, cyanosis or edema.  SKIN:  No rash.  PSYCHIATRIC:  Normal affect.  NEUROLOGIC:  The patient is alert and oriented x3.  Cranial nerves were  intact.  Her grip strength on the right is 4/5.  She is unable to extend  her wrist and has problems with finger-to-nose on the right.  The left  arm strength and Bilateral leg strength is intact.  Light touch  sensation is diminished on the right arm and hand.  Pinprick sensation  is not  affected.  The gait is normal.  SKIN:  No rash.   LABORATORY DATA:  CBC is unremarkable except that she has 13% monocytes,  8% eosinophils, 2% basophils and her white blood cell count is normal at  8800.  PT/PTT unremarkable.  Complete metabolic panel unremarkable.  Cardiac enzymes normal.  EKG shows sinus bradycardia with a rate of 58,  nonspecific T-wave changes.  CT of the brain without contrast shows a  chronic right basal ganglia lacune, nothing acute.   ASSESSMENT/PLAN:  1. Right hand/arm weakness.  The patient will be admitted to      telemetry.  I will check an MRI/MRA of the brain, carotid Dopplers,      echocardiogram, start aspirin.  Also check fasting lipids.      Consider neurology consult in the morning.  Also check homocystine      level  2. Hypertension.  Continue outpatient medications.  3. History of spherocytosis status post splenectomy.      Corinna L. Conley Canal, MD  Electronically Signed     CLS/MEDQ  D:  09/21/2007  T:  09/21/2007  Job:   [id number]   cc:   Judithann Sauger, M.D.

## 2010-11-19 NOTE — Op Note (Signed)
Hannah Willis, LEBO               ACCOUNT NO.:  [id number]   MEDICAL RECORD NO.:  [id number]          PATIENT TYPE:  INP   LOCATION:  8182                         FACILITY:  [facility]   PHYSICIAN:  Jeryl Columbia, M.D.    DATE OF BIRTH:  1950/07/12   DATE OF PROCEDURE:  DATE OF DISCHARGE:                               OPERATIVE REPORT   PROCEDURE:  EGD with biopsy.   INDICATIONS:  Upper GI bleed.  The patient is on Plavix periodic  nonsteroidals.  Consent was signed after risks, benefits, methods,  options thoroughly discussed prior to any sedation.   MEDICINES USED:  Fentanyl 35 mcg, Versed 5 mg.   DESCRIPTION OF THE PROCEDURE:  The video endoscope was inserted by  direct vision.  The esophagus was normal.  She did have a tiny small  hiatal hernia.  Scope passed into the stomach, advanced to the antrum  where a few antral ulcers were seen.  One did have a few flat black  spots could not be made to bleed with washing.  It did not appear to be  a visible vessel seemed to be more like flat black spot.  The scope  passed through a widely patent pylorus into a duodenal bulb pertinent  for a bulbitis and around the C-loop to a normal second portion of the  duodenum.  No blood was seen distally.  Scope was withdrawn back to the  bulb and a good look there without any ulcers in that location.  Scope  was withdrawn back to the stomach.  Again, a few scattered antral ulcers  were washed and watched and could not be made to bleed, particularly the  one with the black spots.  The scope was retroflexed pertinent for a  normal angularis, cardia, fundus, lesser and greater curve.  Straight  visualization of the stomach did not reveal any additional findings.  We  went ahead and rewashed and watched after suctioning all the water out  of the stomach.  The ulcers, we went ahead and elected to take a few  biopsies of the antrum.  A few of the edges of the ulcers not involving  the one with the black  spot and also 2 proximal stomach biopsies to rule  out Helicobacter.  These area which were washed and watched one more  time.  There was no active bleeding in the procedure.  Air water was  suctioned, scope removed.  Again, a good look at the esophagus was  normal.  Scope was removed.  The patient tolerated the procedure well.  There was no obvious immediate complication.   ENDOSCOPIC DIAGNOSES:  1. Tiny small hiatal hernia.  2. Few antral ulcers, 1.2 flat black spot do not think this is a      visible vessel, unable to make bleed with washing status post      biopsy via to the ulcers except for the wound in the black spots.  3. Minimal bulbitis.  4. Otherwise normal EGD without active bleeding.   PLAN:  Pump inhibitors b.i.d. for 2 weeks, then once a day.  Decrease  aspirin nonsteroidals. T ylenol and/or other pain medicines like Ultram  Darvocet, or Fiorinal etc for headache, we will await pathology.  Treat  H. pylori if positive, advance tomorrow if no signs of bleeding, advance  diet and possibly later tomorrow or the following day to expect  hemoglobin drop with hydration.  Repeat EGD p.r.n. signs of bleeding.           ______________________________  Jeryl Columbia, M.D.     MEM/MEDQ  D:  12/19/2007  T:  12/19/2007  Job:  [id number]   cc:   Judithann Sauger, M.D.

## 2010-11-19 NOTE — Consult Note (Signed)
Hannah Willis, ACKROYD NO.:  [id number]   MEDICAL RECORD NO.:  [id number]          PATIENT TYPE:  INP   LOCATION:  7591                         FACILITY:  [facility]   PHYSICIAN:  Jeryl Columbia, M.D.    DATE OF BIRTH:  14-Dec-1950   DATE OF CONSULTATION:  DATE OF DISCHARGE:                                 CONSULTATION   HISTORY:  The patient was seen at the request of Dr. Roel Cluck, regional  hospitalist for hematemesis.  She did have one-time coffee-ground emesis  at home and in the ER.  She did have some weakness and dizziness and  some orthostatic changes.  She was observed overnight, was actually  guaiac negative, and had no further vomiting or bleeding.  Now was  consulted for further workup and plans.  She did have a colonoscopy and  an endoscopy roughly 5 years ago.  She says both turned out for  screening but she has minimal upper tract symptoms chronically and will  use some Tums occasionally, supposedly nothing was seen on those tests.  They were done at the [facility].  She has not seen any blood in  her stools and has no swallowing problems.  She does occasionally use  some over-the-counter medicines and does take Plavix.   PAST MEDICAL HISTORY:  Pertinent for hypertension, spherocytosis with a  history of splenectomy, chronic headaches, left total knee arthropathy,  and 2 CVAs.   MEDICATIONS:  At home include vitamins, Toprol, Plavix, and Lipitor.   ALLERGIES:  CODEINE and MORPHINE, which did not sound like itching or  hives but were intolerant.   SOCIAL HISTORY:  Does use few nonsteroidals for her headaches, but does  not smoke or drink.   FAMILY HISTORY:  Negative for any obvious GI problems.   REVIEW OF SYSTEMS:  Negative except above.   PHYSICAL EXAMINATION:  GENERAL:  No acute distress, lying comfortably in  bed.  VITAL SIGNS:  Stable and afebrile.  NECK:  Supple without adenopathy.  LUNGS:  Clear.  Regular rate and rhythm.  ABDOMEN:   Soft and nontender.  She has actually guaiac-negative stool.   LABORATORY DATA:  Lipase was normal.  Complete metabolic profile was  normal except for a BUN of 37.  Her albumin was 3.8.  Normal liver test.  Creatinine was normal 0.66.  PT 10.3 and INR 1.1.  Hemoglobin 12.3 with  normal white count of 10.7 and a platelet count 370 that was done last  night.  It has not been yet repeated today, although it has been drawn  although I expect it to drop with hydration.  I expect her hemoglobin to  drop as well.   ASSESSMENT:  Hematemesis in a patient on Plavix and some nonsteroidals.   PLAN:  The risks, benefits, and methods of endoscopy were discussed.  We  will proceed ASAP with further workup, plans pending those findings.           ______________________________  Jeryl Columbia, M.D.     MEM/MEDQ  D:  12/19/2007  T:  12/20/2007  Job:  [id number]  cc:   Judithann Sauger, M.D.

## 2010-11-22 NOTE — H&P (Signed)
Hannah Willis, Hannah Willis               ACCOUNT NO.:  [id number]   MEDICAL RECORD NO.:  [id number]          PATIENT TYPE:  INP   LOCATION:  NA                           FACILITY:  [facility]   PHYSICIAN:  John L. Rendall, M.D.  DATE OF BIRTH:  12/21/1950   DATE OF ADMISSION:  06/01/2006  DATE OF DISCHARGE:                              HISTORY & PHYSICAL   CHIEF COMPLAINT:  Left knee pain.   HISTORY OF PRESENT ILLNESS:  [name] is a 60 year old white female  with left knee pain for 1 year.  She has constant pain with no  radiation.  It is achy in nature with some sharp pain.  No injury.  History of the left knee surgery 1999 with partial meniscectomy.  She  does have waking pain.  Mechanical symptoms is positive for painful  popping.  States that vitamin B12 and Equate OA medications help with  her pain.   ALLERGIES:  CODEINE CAUSES NAUSEA.   MEDICATIONS:  1. Toprol-XL 100 mg 1 daily.  2. Vitamin B, vitamin B6, vitamin B12, and potassium supplements      daily.   PAST MEDICAL HISTORY:  Positive for  1. Xerocytosis with history of splenectomy.  2. GERD.  3. Chest pain secondary to GERD with an adenosine Cardiolite study      which showed that she is at low-risk for a cardiovascular event      dated March 31, 2006.   PAST SURGICAL HISTORY:  1. Splenectomy in 1968.  2. 1995 debridement of the right leg wound.  3. 1999 left knee arthroscopy with partial lateral meniscectomy.   SOCIAL HISTORY:  1. The patient has a remote history of smoking.  She smoked a half a      pack of cigarettes daily for 5 years, quit in 1991.  2. The patient lives with her husband in a one-story home with a 3-      step the usual entrance.  3. Patient's mother is age 109, she is reportedly healthy.  Patient's      father committed suicide, had a history of stroke, there is a      question of MI.   REVIEW OF SYSTEMS:  Review of systems reveals the patient wears glasses  at all times.  She has a remote  history of bronchitis.  She had chest  pain earlier this year with an adenosine Cardiolite which showed she was  at low risk for a cardiovascular event and her chest pain was then felt  to be due to gastric reflux which she takes no medications for.  She has  hypertension and headaches otherwise, her review of systems is negative  or noncontributory.   PHYSICAL EXAM:  GENERAL:  Patient is a well-developed, well-nourished  female in no acute distress, talks easily with the examiner.  The  patients's height 5 foot 6 inches, weight 155.  VITAL SIGNS:  Temp 97.9, pulse 68, respiratory rate 14, blood pressure  132/72.  CARDIAC:  Regular rate, rhythm, no murmurs, rubs, or gallops noted.  LUNGS:  Clear to auscultation bilaterally, no wheezing, rhonchi,  or  rales noted.  ABDOMEN:  Soft, nontender except for some very minimal epigastric pain  with palpation.  She does have a well healed scar, left upper quadrant.  Bowel sounds x4 quadrants.  BREASTS, GENITALIA, AND RECTAL:  Exams all deferred at this time.  HEENT:  Head is normocephalic, atraumatic without frontal or maxillary  sinus tenderness to palpation.  Conjunctivae was pink.  Sclerae is  nonicteric.  PERRLA.  EOMs are intact.  There is no visible internal ear  deformities noted.  TMs pearly and gray bilaterally.  Nose:  Nasal  septum midline, nasal mucosa is pink and moist without polyps.  Patient  has good dentition.  Pharynx without erythema or exudate, tongue and  uvula midline.  NECK:  Carotids are 2+ bilaterally without bruits.  She has full range  of motion cervical spine.  Trachea is midline.  No lymphadenopathy.  BACK:  No tenderness to palpation of thoracic lumbar spine.  NEUROLOGIC:  Cranial nerves II-XII are grossly intact.  The patient is  alert and oriented x3.  Lower extremity strength testing reveals 5/5  strength throughout lower extremities.  Deep tendon reflexes are +1 at  the knees and ankles bilaterally and are equal  and symmetric.  MUSCULOSKELETAL:  Upper extremity right shoulder positive for  impingement and positive for apprehension otherwise full range of motion  of both shoulders, both elbows, wrist, hands.  Radial pulses are 2+.  LOWER EXTREMITIES:  The patient has full range of motion of both hips  without pain, flexion, 90 degrees both hips without pain.  Bilateral  knees:  0-120 degrees of flexion, left knee with a 10-degree valgus  deformity.  Valgus varus stress testing reveals no laxity.  Right knee  with a tenderness though to lateral joint line, no tenderness along the  medial lateral joint line of the left knee but she does have some  tenderness over the __________ region.  Calves bilaterally are soft and  nontender.  Dorsalis pedal pulses are 2+ bilaterally.   X-RAYS:  X-rays of the left knee showed near bone-on-bone lateral  compartment.   IMPRESSION:  1. Left knee osteoarthritis.  2. Xerocytosis with a history of splenectomy.  3. Gastric reflux, untreated.  4. Hypertension.   PLAN:  The patient is to undergo a left total knee arthroplasty by Dr.  Telford Nab on June 01, 2006.  Prior to surgery patient did receive  cardiac clearance from Dr. Sherren Mocha.  Patient was deemed at low  risk for any cardiovascular events with the upcoming surgery.      Erskine Emery, P.A.      John L. Rendall, M.D.  Electronically Signed    GC/MEDQ  D:  05/26/2006  T:  05/27/2006  Job:  [id number]   cc:   Jenny Reichmann L. Rendall, M.D.

## 2010-11-22 NOTE — Op Note (Signed)
Hannah Willis, Hannah Willis               ACCOUNT NO.:  [id number]   MEDICAL RECORD NO.:  [id number]          PATIENT TYPE:  INP   LOCATION:  0002                         FACILITY:  [facility]   PHYSICIAN:  John L. Rendall, M.D.  DATE OF BIRTH:  10/02/1950   DATE OF PROCEDURE:  DATE OF DISCHARGE:                               OPERATIVE REPORT   PREOPERATIVE DIAGNOSIS:  Osteoarthritis left knee with deformity.   SURGICAL PROCEDURES:  Left total knee replacement with computer  navigation assistance.   POSTOPERATIVE DIAGNOSIS:  Osteoarthritis left knee with deformity.   SURGEON:  John L. Rendall, M.D.   ASSISTANTAaron Edelman D. Petrarca, P.A.-C.   ANESTHESIA:  General.   PATHOLOGY:  The patient has valgus knee measured by computer to have 8  degrees of valgus from the anatomic axis and 4 degrees of fixed flexion  deformity.  There is bone against bone in the lateral compartment.  She  is 59 years old and it is felt very important to correct this young  patient's longitudinal alignment as close as possible to anatomic axis  for durability of the prosthesis.  At the end of the case her measured  deformity was 0.5 degrees valgus and 0.5 degrees flexion which is a  dramatic improvement.   PROCEDURE:  Under general anesthesia with a femoral nerve block, the  left leg was prepared with DuraPrep and draped as a sterile field.  The  sterile tourniquet was applied to the proximal thigh, legs wrapped out  with the Esmarch and the tourniquet was used at 350 mm.  A midline  incision was made.  The patella was everted.  The femur was sized at a  standard-plus.  Preparation was done for computer mapping.  Schanz pins  were placed in the proximal medial tibia through accessory skin  punctures and the distal medial femur through the surgical incision.  The arrays were set up, the femoral head was identified.  The medial and  lateral malleoli were identified and the proximal tibia and distal femur  were all  mapped within 0.5 mm of reproducible results.  Once this was  done, the first guide was used for proximal tibial resection with  computer assistance.  Approximately 10 mm was taken off the high side, 8  mm off the low side and this was verified within 1/2 degree of anatomic  access.  Ligament balancing was then done with the tensioner and once  this within 1 degree, it was necessary to do some pie crusting of the IT  band and posterior lateral capsule.  Once this was completed, the first  cuts were made on the femur with the femoral guide, anterior posterior  flare were resected.  This measured at approximate 10 mm flexion gap.  The distal femoral cut was then made with a 10 mm extension gap with the  spacer blocks in place.  This was within 1 degree of anatomical axis.  The recessing guide was then used.  The lamina spreader was then used.  Remnants of menisci and cruciates were resected.  The knee was further  debrided, spurs  taken off the back of the femoral condyles.  The  proximal tibia was then exposed with Feliciana Rossetti and [place], it was sized to  a #3, the center peg hole with keel was inserted.  Trial of #10 and  #12.5 bearings were done with a standard-plus femur.  The #10 gave a  better fit.  The patella was then osteotomized, three peg holes made.  With the #10 in place, the knee was within 1 degree of anatomical axis  and full extension within 1 degree.  Permanent components were then  obtained.  The bony surfaces prepared with pulse irrigation.  All  components cemented in place.  With all components cemented into place  and the capsule closed, the knee measured 0.5 degrees of valgus and 0.5  degrees flexion from the anatomical axis.  The Schanz pins and the  arrays were then taken down, tourniquet let down at an hour and 5  minutes.  Multiple small vessels were cauterized.  The excess cement was  removed.  The knee was then closed in layers with #1 Tycron, 0 and 2-0  Vicryl and  skin clips.  The patient returned to recovery in good  condition.  Dramatic improvement in the alignment was felt to be  present.  Mike Craze Petrarca, P.A.-C.  was there through the entire case  and assisted in all portions of the procedure.      John L. Rendall, M.D.  Electronically Signed     JLR/MEDQ  D:  06/01/2006  T:  06/01/2006  Job:  [id number]

## 2010-11-22 NOTE — Letter (Signed)
March 31, 2006     Judithann Sauger, M.D.  [address]  [place], [place] [address]   RE:  Hannah Willis, Hannah Willis  MRN:  [id number]  /  DOB:  1951/05/29   Dear Dr. Tamala Julian,   It was my pleasure to see Antony Contras in followup at the [facility].  As you know, she is a very pleasant 60 year old woman  who I evaluated on September 12th for an abnormal EKG.  She has occasional  atypical chest pains and is scheduled for knee replacement surgery in  November.  Because of this, you checked an EKG in your office that had  abnormal anterolateral ST changes.  We repeated the EKG in our office last  week and found similar findings.  Because of the abnormal EKG, I performed  an adenosine Myoview scan.  Her Myoview demonstrated normal left ventricular  function with an ejection fraction of 68%.  There was normal thickening in  all areas of the myocardium.  There were no ST segment changes with  Adenosine and there was no sign of ischemia or scar.   I reviewed the findings with her today in detail.  She is at low risk of any  cardiovascular problems with her upcoming surgery.  I would not recommend  any further cardiovascular testing at this time, as I think the likelihood  of having significant obstructive coronary artery disease is exceedingly  low.  I would recommend continuation of her Toprol throughout the  perioperative period as she has been on this medication chronically and  patients can have a significant hypertensive and tachycardic rebound  response if this is discontinued abruptly.  Other than that recommendation,  she should be fine with surgery without any further testing or  recommendations regarding her therapy.  I will see her back on an as needed  basis.  If she has any problems in the future, please do not hesitate to  contact me at any time.    Sincerely,      Juanda Bond. Burt Knack, MD   MDC/MedQ  DD:  03/31/2006  DT:  04/02/2006  Job #:  [id number]   CC:     John L. Rendall, M.D.

## 2010-11-22 NOTE — Letter (Signed)
March 18, 2006     Hannah Willis, M.D.  [address]  [place], [place] [address]   RE:  Hannah Willis, Hannah Willis  MRN:  [id number]  /  DOB:  Jul 18, 1950   Dear Dr. Tamala Julian:   It was my pleasure to evaluate Hannah Willis at the [facility] this afternoon.  As you know, she is a very pleasant 60 year old  woman who presents for an abnormal EKG.   Ms. Stehle has occasional symptoms of chest pain and shortness of breath but  these occur very rarely.  Overall she has had minimal cardiac  symptomatology.  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 She has severe  osteoarthritis of her left knee and is planning on undergoing a left knee  surgery later this year.  She exercises regularly on a stationary bike  somewhere between 15 to 60 minutes daily.  She reports performing fairly  vigorous exercise on the bike and is without any chest pain or dyspnea when  she does this.  She has a history of mitral valve prolapse and hypertension  and has been on Toprol XL for these problems with a good response.  She has  not had recent palpitations, syncope, orthopnea, PND or claudication  symptoms.  There was an EKG performed at your office yesterday that was  abnormal and raised the issue of possible myocardial ischemia and she was  subsequently referred here for further evaluation.   PAST MEDICAL HISTORY:  1. 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  2. Arthroscopic left knee surgery in 2000.  3. Calf muscle repair in 1997.  4. Essential hypertension.  5. Mitral valve prolapse.   SOCIAL HISTORY:  The patient is married.  She does not have children. She  works as an Futures trader in [employer].  She quit smoking  cigarettes 16 years ago.  She has never used drugs or alcohol. She drinks  approximately 1 cup of caffeinated beverage daily and is involved in a  regular exercise program as described above.   FAMILY HISTORY:  Pertinent for her father who died of suicide at age 43. He  had an autopsy performed that demonstrated silent myocardial infarctions.  Her mother is alive and well at age 82.  She has no siblings.   REVIEW OF SYSTEMS:  A complete 12-point review of systems was performed.  Pertinent positives included gastroesophageal reflux and osteoarthritis.  She has occasional light-headedness and headaches.  There were no other  pertinent positives to report.   CURRENT MEDICATIONS:  1. Toprol XL 100 mg daily.  2. Vitamin B.  3. She takes Excedrin and aspirin on an as needed basis.   ALLERGIES:  CODEINE   PHYSICAL EXAMINATION:  GENERAL:  Ms. Hannah Willis is a healthy-appearing 49-year-  old woman in no acute distress.  She is alert and oriented.  VITAL SIGNS:  Her weight is 161 pounds.  Height is 5 feet 6 inches.  Blood  pressure is 122/98 initially, on my recheck her blood pressure was 120/68 in  both arms.  Heart rate 75.  Respiratory rate is 12.  EYES:  Sclerae anicteric.  Conjunctivae pink.  Pupils equal, round and  reactive to light.  ENT:  Oropharynx is clear with moist oral mucosa.  NECK:  There is no cervical lymphadenopathy. Normal carotid upstrokes  without bruits.  Jugular venous pressure is normal.  LUNGS:  Clear  to auscultation bilaterally.  CARDIOVASCULAR:  The apex is discrete and non-displaced.  The heart is  regular rate and rhythm with a prominent mid-systolic click present.  There  are no murmurs or gallops.  ABDOMEN:  Soft, non-tender.  No abdominal bruits.  No organomegaly.  EXTREMITIES:  There is no clubbing, cyanosis or edema.  Peripheral pulses  are 2+ and equal throughout.  SKIN:  Warm and dry.  NEUROLOGIC:  Is grossly intact with 5/5 motor strength in the arms and legs  bilaterally.   EKG was repeated in the office today and demonstrated sinus bradycardia with  a heart rate of 58.  Intervals were all normal.  There are anterior ST  segment and T-wave  changes that are non-specific but could represent  ischemia.  There is lateral P-wave flattening present.   ASSESSMENT:  Ms. Whittenberg presents with an electrocardiogram that could be  consistent with antral lateral ischemia.  From a symptomatic standpoint she  has no concerning symptoms whatsoever.  She is very physically active and  has no symptoms with exertion.  Her chest pains have been highly atypical  and unrelated to exertion.  Despite her lack of symptoms with her abnormal  electrocardiogram findings I would like to proceed with an adenosine Myoview  study.  Ideally we would be able to exercise her for a stress test but I  think her left knee will limit her exercise capacity on a treadmill.  Of  note she did have an adenosine Myoview back in November 2005 that showed no  perfusion defects and normal left ventricular function.  She was advised  that if she has any chest discomfort that is different from sensations that  she has had in the past, she needs to contact us immediately or be evaluated  immediately.   My overall impression is that her electrocardiogram findings are unrelated  to ischemic myocardial disease but it would be  prudent to rule this out at  this point.  I would like to see her back in a few weeks for followup and I  will contact her after the results of her stress test are available.  Pending a low risk stress study, she is an excellent candidate for her  upcoming total knee replacement surgery from a cardiovascular standpoint.   Dr. Tamala Julian, thank you again for the opportunity to evaluate Hannah Willis.  Please feel free to contact me at any time with questions regarding her  care.    Sincerely,      Juanda Bond. Burt Knack, MD   MDC/MedQ  DD:  03/18/2006  DT:  03/19/2006  Job #:  [id number]

## 2010-11-22 NOTE — Discharge Summary (Signed)
NAMEKENZLEE, Hannah Willis               ACCOUNT NO.:  [id number]   MEDICAL RECORD NO.:  [id number]          PATIENT TYPE:  INP   LOCATION:  1443                         FACILITY:  [facility]   PHYSICIAN:  John L. Rendall, M.D.  DATE OF BIRTH:  [birth year]   DATE OF ADMISSION:  06/01/2006  DATE OF DISCHARGE:  06/04/2006                               DISCHARGE SUMMARY   ADMISSION DIAGNOSIS:  Osteoarthritis of the left knee.   DISCHARGE DIAGNOSES:  1. Osteoarthritis of the left knee.  2. Gastroesophageal reflux disease.  3. Hypertension.  4. Hereditary spherocytosis with splenectomy.   PROCEDURE:  Left total knee arthroplasty.   HISTORY:  Hannah Willis is a 60 year old white female with left pain for one  year.  She has constant pain with no radiation.  It is an achy pain with  some sharpness to it.  She does have waking pain.  Mechanical symptoms  are positive for painful popping.  She takes vitamin B12 and Equate over-  the-counter.  Osteoarthritis medicines do help with her pain.  Admitted  at this time for left total knee arthroplasty.   HOSPITAL COURSE:  A 60 year old female admitted June 01, 2006, after  appropriate laboratory studies were obtained.  Was taken to the  operating room, where she underwent a left total knee arthroplasty.  She  tolerated the procedure well.  She was placed preoperatively on Ancef 1  g IV and postoperatively 1 g IV q.8h. x3 doses.  A Dilaudid PCA pump was  used for pain management.  Arixtra 2.5 mg subcu begun at 10 p.m. on the  night of her surgery and continued for 7 days.  Celebrex 100 mg p.o. on  the day of her surgery and then 200 mg p.o. b.i.d. was also ordered.  Consultations with PT/OT and care management were made.  CPM 0-90  degrees was initiated.  Weightbearing as tolerated and physical therapy.  A Foley was placed intraoperatively.  She was allowed out of bed to a  chair the following day.  Because her IV was discontinued accidentally,  she was  placed on Keflex 500 mg p.o. q.i.d. on the 27th for four doses  only.  She was instructed on Lovenox and Arixtra injections.  Dressings  were changed on the 28th and Hemovacs were pulled.  On the 28th a chest  x-ray was ordered and incentive spirometry was also ordered.  The  remainder of her hospital course was uneventful and she was discharged  on the 29th, where she will return in follow-up back to Dr. Roxan Diesel  office 2 weeks postop.   LABORATORY DATA:  Admitted with a hemoglobin of 14.6, hematocrit 40.2%,  white count 7500, platelets 406,000.  Discharge hemoglobin 10.0,  hematocrit 27.8%, white count 10,400, platelets 245,000.  She had  atypical lymphocytes and atypical mononuclear cells.  Pro time 13.4, INR  1.0, and PTT was 38.  Preop sodium 139, potassium 3.9, chloride 105, CO2  26, glucose 95, BUN 13, creatinine 0.6, calcium 10.0.  Total protein  6.9, albumin 4.0, AST 22, ALT 17, alkaline phosphatase 82, and total  bilirubin  was 1.5.  Discharge sodium 138, potassium 3.3, chloride 105,  CO2 28, glucose 112, BUN 4, creatinine 0.6, and calcium 8.6.  Urinalysis  preop revealed few epithelials and few bacteria.  November 26 revealed a  small mild leukocyte esterase with 0-2 whites, 0-2 reds, and bacteria  few.  Blood type was O positive, antibody screen negative.  Urine  culture revealed 8000 colonies/mL of insignificant growth on November  20.  On the 26th she revealed 20,000 colonies of diphtheroids out of  Corynebacterium.   Chest x-ray revealed no acute disease.   DISCHARGE INSTRUCTIONS:  There is no restriction on her diet.  No  driving or lifting for 6 weeks.  Weightbearing as tolerated with her  crutches.  Follow up with instruction sheet.  Keep the incision clean  and dry and cover the area.  Percocet 325 mg one to two tablets every 4  hours as needed for pain.  Arixtra 2.5 mg injected as instructed at 9  p.m. daily, last dose to be June 07, 2006.  Begin aspirin 81 mg  on  June 08, 2006, taking one daily.  Continue with the preop medications  as noted.  CPM 0-90 degrees for 6-8 hours per day.  Increase by 5  degrees per day to a maximum of 110 degrees.  Home PT as ordered.  Follow back up with Dr. Telford Nab 2 weeks postop on June 16, 2006.   Discharged in improved condition.      Mike Craze Petrarca, P.A.-C.      Karen Chafe. Rendall, M.D.  Electronically Signed    BDP/MEDQ  D:  07/11/2006  T:  07/11/2006  Job:  [id number]

## 2011-02-17 ENCOUNTER — Other Ambulatory Visit: Payer: Self-pay | Admitting: Internal Medicine

## 2011-03-31 LAB — BETA-2-GLYCOPROTEIN I ABS, IGG/M/A
Beta-2 Glyco I IgG: 9 U/mL (ref <20)
Beta-2-Glycoprotein I IgA: <4 U/mL (ref <10)

## 2011-03-31 LAB — LIPID PANEL
Cholesterol: 178
HDL: 54
LDL Cholesterol: 114 — ABNORMAL HIGH
Total CHOL/HDL Ratio: 3.3
Triglycerides: 51
VLDL: 10

## 2011-03-31 LAB — BASIC METABOLIC PANEL
BUN: 13
CO2: 26
Calcium: 9.4
Chloride: 109
Creatinine, Ser: 0.69
Glucose, Bld: 100 — ABNORMAL HIGH

## 2011-03-31 LAB — PROTEIN S, TOTAL: Protein S Ag, Total: 154 % — ABNORMAL HIGH (ref 70–140)

## 2011-03-31 LAB — SEDIMENTATION RATE: Sed Rate: 3

## 2011-03-31 LAB — DIFFERENTIAL
Eosinophils Absolute: 0.7
Lymphocytes Relative: 32
Lymphs Abs: 2.8
Neutrophils Relative %: 45

## 2011-03-31 LAB — PROTEIN S ACTIVITY: Protein S Activity: 100 % (ref 69–129)

## 2011-03-31 LAB — CARDIOLIPIN ANTIBODIES, IGG, IGM, IGA
Anticardiolipin IgG: 9 — ABNORMAL LOW (ref <11)
Anticardiolipin IgM: <7 — ABNORMAL LOW (ref <10)

## 2011-03-31 LAB — PROTIME-INR
INR: 1
Prothrombin Time: 13.8

## 2011-03-31 LAB — CK TOTAL AND CKMB (NOT AT ARMC): Total CK: 130

## 2011-03-31 LAB — CBC
MCHC: 35.5
MCHC: 35.9
MCV: 88.8
MCV: 89.2
Platelets: 358
RDW: 12.6
RDW: 13.3

## 2011-03-31 LAB — ANTITHROMBIN III: AntiThromb III Func: 111 (ref 76–126)

## 2011-03-31 LAB — COMPREHENSIVE METABOLIC PANEL
ALT: 13
CO2: 24
Calcium: 8.9
Creatinine, Ser: 0.74
GFR calc non Af Amer: >60
Glucose, Bld: 114 — ABNORMAL HIGH

## 2011-03-31 LAB — LUPUS ANTICOAGULANT PANEL
PTT Lupus Anticoagulant: 61.1 — ABNORMAL HIGH (ref 36.3–48.8)
PTTLA Confirmation: 0 (ref <8.0)
dRVVT Incubated 1:1 Mix: 40.5 (ref 36.1–47.0)

## 2011-03-31 LAB — PROTEIN C, TOTAL: Protein C, Total: 94 % (ref 70–140)

## 2011-03-31 LAB — FACTOR 5 LEIDEN

## 2011-04-03 LAB — CBC
HCT: 29 — ABNORMAL LOW
HCT: 34.6 — ABNORMAL LOW
HCT: 24.7 — ABNORMAL LOW
HCT: 26.6 — ABNORMAL LOW
HCT: 29.7 — ABNORMAL LOW
Hemoglobin: 10.3 — ABNORMAL LOW
Hemoglobin: 12.3
Hemoglobin: 9.8 — ABNORMAL LOW
Hemoglobin: 10.2 — ABNORMAL LOW
Hemoglobin: 10.7 — ABNORMAL LOW
Hemoglobin: 9.6 — ABNORMAL LOW
MCHC: 35.5
MCHC: 35.6
MCV: 90.3
MCV: 90.7
MCV: 91.5
MCV: 91.6
Platelets: 326
Platelets: 370
Platelets: 284
Platelets: 297
RBC: 3 — ABNORMAL LOW
RBC: 3.78 — ABNORMAL LOW
RBC: 2.7 — ABNORMAL LOW
RBC: 3.12 — ABNORMAL LOW
RBC: 3.28 — ABNORMAL LOW
RDW: 13.3
RDW: 13.5
RDW: 13.1
RDW: 13.4
RDW: 13.4
RDW: 13.8
WBC: 10.7 — ABNORMAL HIGH
WBC: 8.2
WBC: 10.7 — ABNORMAL HIGH
WBC: 7.7
WBC: 8

## 2011-04-03 LAB — COMPREHENSIVE METABOLIC PANEL
ALT: 22
AST: 26
Albumin: 3.8
Alkaline Phosphatase: 68
Alkaline Phosphatase: 86
BUN: 20
BUN: 37 — ABNORMAL HIGH
BUN: 6
CO2: 27
CO2: 24
Calcium: 9.7
Chloride: 107
Chloride: 113 — ABNORMAL HIGH
Creatinine, Ser: 0.66
Creatinine, Ser: 0.67
Creatinine, Ser: 0.61
GFR calc Af Amer: >60
GFR calc non Af Amer: >60
GFR calc non Af Amer: >60
Glucose, Bld: 114 — ABNORMAL HIGH
Glucose, Bld: 124 — ABNORMAL HIGH
Potassium: 3.3 — ABNORMAL LOW
Potassium: 4.1
Sodium: 141
Total Bilirubin: 1
Total Bilirubin: 1.1
Total Bilirubin: 1
Total Protein: 5.5 — ABNORMAL LOW
Total Protein: 6.5

## 2011-04-03 LAB — DIFFERENTIAL
Basophils Absolute: 0.1
Eosinophils Relative: 3
Lymphocytes Relative: 22
Lymphs Abs: 2.4
Monocytes Absolute: 0.7
Monocytes Relative: 6
Neutro Abs: 7.2

## 2011-04-03 LAB — PROTIME-INR
INR: 1.1
Prothrombin Time: 14.3

## 2011-04-03 LAB — APTT: aPTT: 32

## 2011-04-03 LAB — POTASSIUM: Potassium: 3.5

## 2011-04-03 LAB — ABO/RH: ABO/RH(D): O POS

## 2011-04-03 LAB — TYPE AND SCREEN: ABO/RH(D): O POS

## 2011-04-03 LAB — BASIC METABOLIC PANEL
BUN: 2 — ABNORMAL LOW
Calcium: 8.8
GFR calc non Af Amer: >60
Potassium: 3.8
Sodium: 139

## 2011-04-03 LAB — LIPASE, BLOOD: Lipase: 17

## 2011-04-03 LAB — OCCULT BLOOD X 1 CARD TO LAB, STOOL: Fecal Occult Bld: NEGATIVE

## 2011-10-06 ENCOUNTER — Other Ambulatory Visit: Payer: Self-pay

## 2011-10-06 ENCOUNTER — Emergency Department (HOSPITAL_COMMUNITY): Payer: BC Managed Care – PPO

## 2011-10-06 ENCOUNTER — Encounter (HOSPITAL_COMMUNITY): Payer: Self-pay | Admitting: *Deleted

## 2011-10-06 ENCOUNTER — Emergency Department (HOSPITAL_COMMUNITY)
Admission: EM | Admit: 2011-10-06 | Discharge: 2011-10-06 | Disposition: A | Discharge status: Home / Self Care | Payer: BC Managed Care – PPO | Attending: Emergency Medicine | Admitting: Emergency Medicine

## 2011-10-06 DIAGNOSIS — Z8673 Personal history of transient ischemic attack (TIA), and cerebral infarction without residual deficits: Secondary | ICD-10-CM | POA: Insufficient documentation

## 2011-10-06 DIAGNOSIS — I10 Essential (primary) hypertension: Secondary | ICD-10-CM | POA: Insufficient documentation

## 2011-10-06 DIAGNOSIS — R072 Precordial pain: Secondary | ICD-10-CM | POA: Insufficient documentation

## 2011-10-06 DIAGNOSIS — Z79899 Other long term (current) drug therapy: Secondary | ICD-10-CM | POA: Insufficient documentation

## 2011-10-06 HISTORY — DX: Essential (primary) hypertension: I10

## 2011-10-06 HISTORY — DX: Cerebral infarction, unspecified: I63.9

## 2011-10-06 LAB — DIFFERENTIAL
Basophils Absolute: 0.2 10*3/uL — ABNORMAL HIGH (ref 0.0–0.1)
Basophils Relative: 2 % — ABNORMAL HIGH (ref 0–1)
Eosinophils Absolute: 0.6 10*3/uL (ref 0.0–0.7)
Eosinophils Relative: 7 % — ABNORMAL HIGH (ref 0–5)
Lymphocytes Relative: 33 % (ref 12–46)
Lymphs Abs: 3.2 10*3/uL (ref 0.7–4.0)
Monocytes Absolute: 0.9 10*3/uL (ref 0.1–1.0)
Monocytes Relative: 10 % (ref 3–12)
Neutro Abs: 4.8 10*3/uL (ref 1.7–7.7)
Neutrophils Relative %: 49 % (ref 43–77)

## 2011-10-06 LAB — COMPREHENSIVE METABOLIC PANEL WITH GFR
ALT: 17 U/L (ref 0–35)
AST: 30 U/L (ref 0–37)
Albumin: 3.9 g/dL (ref 3.5–5.2)
Alkaline Phosphatase: 90 U/L (ref 39–117)
BUN: 14 mg/dL (ref 6–23)
CO2: 25 meq/L (ref 19–32)
Calcium: 9.5 mg/dL (ref 8.4–10.5)
Chloride: 108 meq/L (ref 96–112)
Creatinine, Ser: 0.68 mg/dL (ref 0.50–1.10)
GFR calc Af Amer: >90 mL/min
GFR calc non Af Amer: >90 mL/min
Glucose, Bld: 71 mg/dL (ref 70–99)
Potassium: 4 meq/L (ref 3.5–5.1)
Sodium: 143 meq/L (ref 135–145)
Total Bilirubin: 0.5 mg/dL (ref 0.3–1.2)
Total Protein: 7 g/dL (ref 6.0–8.3)

## 2011-10-06 LAB — CBC
HCT: 39 % (ref 36.0–46.0)
MCH: 32.8 pg (ref 26.0–34.0)
MCV: 90.1 fL (ref 78.0–100.0)
Platelets: 375 10*3/uL (ref 150–400)
RBC: 4.33 MIL/uL (ref 3.87–5.11)
WBC: 9.8 10*3/uL (ref 4.0–10.5)

## 2011-10-06 LAB — POCT I-STAT TROPONIN I: Troponin i, poc: 0 ng/mL (ref 0.00–0.08)

## 2011-10-06 NOTE — ED Notes (Signed)
Patient denies pain and is resting comfortably.  

## 2011-10-06 NOTE — Discharge Instructions (Signed)
Chest Pain (Nonspecific) It is often hard to give a specific diagnosis for the cause of chest pain. There is always a chance that your pain could be related to something serious, such as a heart attack or a blood clot in the lungs. You need to follow up with your caregiver for further evaluation. CAUSES   Heartburn.   Pneumonia or bronchitis.   Anxiety or stress.   Inflammation around your heart (pericarditis) or lung (pleuritis or pleurisy).   A blood clot in the lung.   A collapsed lung (pneumothorax). It can develop suddenly on its own (spontaneous pneumothorax) or from injury (trauma) to the chest.   Shingles infection (herpes zoster virus).  The chest wall is composed of bones, muscles, and cartilage. Any of these can be the source of the pain.  The bones can be bruised by injury.   The muscles or cartilage can be strained by coughing or overwork.   The cartilage can be affected by inflammation and become sore (costochondritis).  DIAGNOSIS  Lab tests or other studies, such as X-rays, electrocardiography, stress testing, or cardiac imaging, may be needed to find the cause of your pain.  TREATMENT   Treatment depends on what may be causing your chest pain. Treatment may include:   Acid blockers for heartburn.   Anti-inflammatory medicine.   Pain medicine for inflammatory conditions.   Antibiotics if an infection is present.   You may be advised to change lifestyle habits. This includes stopping smoking and avoiding alcohol, caffeine, and chocolate.   You may be advised to keep your head raised (elevated) when sleeping. This reduces the chance of acid going backward from your stomach into your esophagus.   Most of the time, nonspecific chest pain will improve within 2 to 3 days with rest and mild pain medicine.  HOME CARE INSTRUCTIONS   If antibiotics were prescribed, take your antibiotics as directed. Finish them even if you start to feel better.   For the next few  days, avoid physical activities that bring on chest pain. Continue physical activities as directed.   Do not smoke.   Avoid drinking alcohol.   Only take over-the-counter or prescription medicine for pain, discomfort, or fever as directed by your caregiver.   Follow your caregiver's suggestions for further testing if your chest pain does not go away.   Keep any follow-up appointments you made. If you do not go to an appointment, you could develop lasting (chronic) problems with pain. If there is any problem keeping an appointment, you must call to reschedule.  SEEK MEDICAL CARE IF:   You think you are having problems from the medicine you are taking. Read your medicine instructions carefully.   Your chest pain does not go away, even after treatment.   You develop a rash with blisters on your chest.  SEEK IMMEDIATE MEDICAL CARE IF:   You have increased chest pain or pain that spreads to your arm, neck, jaw, back, or abdomen.   You develop shortness of breath, an increasing cough, or you are coughing up blood.   You have severe back or abdominal pain, feel nauseous, or vomit.   You develop severe weakness, fainting, or chills.   You have a fever.  THIS IS AN EMERGENCY. Do not wait to see if the pain will go away. Get medical help at once. Call your local emergency services (911 in [place] Do not drive yourself to the hospital. MAKE SURE YOU:   Understand these instructions.  Will watch your condition.   Will get help right away if you are not doing well or get worse.  Document Released: 04/02/2005 Document Revised: 06/12/2011 Document Reviewed: 01/27/2008 [facility] Patient Information 2012 [place].  Return for any new or worsening symptoms or any other concerns.

## 2011-10-06 NOTE — ED Notes (Signed)
MD at bedside. Dr. Birdena Jubilee

## 2011-10-06 NOTE — ED Notes (Signed)
Pt reports calling pcp d/t intermittent anterior chest wall pain radiating to through to her back starting on Wednesday, woke this am w/same s/s pcp instructed pt to come toour ED. Pt denies active chest pain. Pt reports taking OTC Excedrin and using hot and cold packs w/o any relief. Pt denies sob, diaphoresis, abd/shoulder/jaw pain, or N/V

## 2011-10-06 NOTE — ED Notes (Signed)
The pt has had bi-lateral chest pain since this past Wednesday.  Better today but her doctors sent her in to be checked.  No sob no nausea no  dizziness

## 2011-10-06 NOTE — ED Notes (Signed)
Pt describes her chest pain as pressure and tender w/palpation, nothing makes the pain worse and nothing relieves the pain

## 2011-10-06 NOTE — ED Provider Notes (Signed)
History     CSN: [id number]  Arrival date & time 10/06/11  1625   First MD Initiated Contact with Patient 10/06/11 1857      Chief Complaint  Patient presents with  . Chest Pain    (Consider location/radiation/quality/duration/timing/severity/associated sxs/prior treatment) HPI CC cp onset about 5 days ago, mild, off and on, no exertional component, described as pressure, substernal, nonradiating, no aggravarting or alleviating factors, no associated n/v, sob, sweating or lightheadedness   Past Medical History  Diagnosis Date  . Stroke   . Hypertension     History reviewed. No pertinent past surgical history.  No family history on file.  History  Substance Use Topics  . Smoking status: Never Smoker   . Smokeless tobacco: Not on file  . Alcohol Use: Yes    OB History    Grav Para Term Preterm Abortions TAB SAB Ect Mult Living                  Review of Systems  Constitutional: Negative for fever and chills.  Respiratory: Negative for cough and shortness of breath.   Cardiovascular: Positive for chest pain. Negative for palpitations and leg swelling.  Gastrointestinal: Negative for nausea and vomiting.  Neurological: Negative for light-headedness.  All other systems reviewed and are negative.    Allergies  Codeine and Morphine and related  Home Medications   Current Outpatient Rx  Name Route Sig Dispense Refill  . VITAMIN B50 COMPLEX PO Oral Take 1 tablet by mouth daily.    Marland Kitchen CALCIUM PO Oral Take 1 tablet by mouth daily.    Marland Kitchen VITAMIN D PO Oral Take 1 tablet by mouth daily.    Marland Kitchen VITAMIN B-12 PO Oral Take 1 tablet by mouth daily.    Marland Kitchen METOPROLOL SUCCINATE ER 100 MG PO TB24 Oral Take 100 mg by mouth daily. Take with or immediately following a meal.    . POTASSIUM PO Oral Take 1 tablet by mouth daily. OTC potassium    . VITAMIN B-6 PO Oral Take 1 tablet by mouth daily.      BP 148/77  Pulse 64  Temp(Src) 97.6 F (36.4 C) (Oral)  Resp 16  SpO2 98%ra  wnl  Physical Exam  Nursing note and vitals reviewed. Constitutional: She appears well-developed and well-nourished.  HENT:  Head: Normocephalic and atraumatic.  Eyes: Right eye exhibits no discharge. Left eye exhibits no discharge.  Neck: Normal range of motion. Neck supple.  Cardiovascular: Normal rate, regular rhythm and normal heart sounds.   Pulmonary/Chest: Effort normal and breath sounds normal.  Abdominal: Soft. There is no tenderness.  Musculoskeletal: She exhibits no edema and no tenderness.  Neurological: She is alert. GCS eye subscore is 4. GCS verbal subscore is 5. GCS motor subscore is 6.  Skin: Skin is warm and dry.  Psychiatric: She has a normal mood and affect. Her behavior is normal.    ED Course  Procedures (including critical care time)  Labs Reviewed  CBC - Abnormal; Notable for the following:    MCHC 36.4 (*)    All other components within normal limits  DIFFERENTIAL - Abnormal; Notable for the following:    Eosinophils Relative 7 (*)    Basophils Relative 2 (*)    Basophils Absolute 0.2 (*)    All other components within normal limits  COMPREHENSIVE METABOLIC PANEL  POCT I-STAT TROPONIN I  POCT I-STAT TROPONIN I  LAB REPORT - SCANNED   Dg Chest 2 View  10/06/2011  *  RADIOLOGY REPORT*  Clinical Data: 61 year old female with pain behind the sternum. Chest pain.  CHEST - 2 VIEW  Comparison: 11/04/2010 and earlier.  Findings: Postoperative changes to the right shoulder again noted. Mild elevation of the right hemidiaphragm.  Cardiac size and mediastinal contours are within normal limits.  Visualized tracheal air column is within normal limits.  The lungs are clear.  No pneumothorax or effusion.  Osteopenia.  Sternum appears intact on the lateral view. No acute osseous abnormality identified. Stable scoliosis.  IMPRESSION: No acute cardiopulmonary abnormality.  Original Report Authenticated By: Randall An, M.D.     1. Chest pain       Date: 10/07/2011   Rate: 51  Rhythm: sinus bradycardia  QRS Axis: normal  Intervals: normal  ST/T Wave abnormalities: nonspecific ST changes and nonspecific T wave changes  Conduction Disutrbances:none  Narrative Interpretation:   Old EKG Reviewed: unchanged   MDM  Pt is in nad, afvss, nontoxic appearing, exam and hx as above.  Pt with h/o htn but no other cad risk factors (stroke in hx not actually seen on mri, full eval of vasculature did not show pvd), here with atypical cp.  Wells score is 0, no tachy, not pleuritic, no sob, no s/sx dvt, doubt pe.  Doubt dissection or esophageal injury.  cxr nl, no infectious s/sx's, doubt infection.  Trop wnl, obtained delta trop and nl as well.  Doubt acs.  Pt to f/u with her pcp for outpt stress test.        Lake Bells, MD 10/07/11 1753

## 2011-10-08 NOTE — ED Provider Notes (Signed)
I saw and evaluated the patient, reviewed the resident's note and I agree with the findings and plan.  Intermittent chest pain x 5 days.  "all day" weds and thurs.  Bilateral, nonradiating.  EKG nonischemic. Atypical for ACS.  Ezequiel Essex, MD 10/08/11 1013

## 2011-10-20 ENCOUNTER — Other Ambulatory Visit: Payer: Self-pay | Admitting: Obstetrics & Gynecology

## 2013-03-29 ENCOUNTER — Other Ambulatory Visit: Payer: Self-pay | Admitting: Obstetrics & Gynecology

## 2013-04-01 ENCOUNTER — Other Ambulatory Visit: Payer: Self-pay | Admitting: Obstetrics & Gynecology

## 2013-04-01 DIAGNOSIS — R928 Other abnormal and inconclusive findings on diagnostic imaging of breast: Secondary | ICD-10-CM

## 2013-04-13 ENCOUNTER — Ambulatory Visit
Admission: RE | Admit: 2013-04-13 | Discharge: 2013-04-13 | Disposition: A | Discharge status: Home / Self Care | Payer: PRIVATE HEALTH INSURANCE | Source: Ambulatory Visit | Attending: Obstetrics & Gynecology | Admitting: Obstetrics & Gynecology

## 2013-04-13 DIAGNOSIS — R928 Other abnormal and inconclusive findings on diagnostic imaging of breast: Secondary | ICD-10-CM

## 2014-03-15 ENCOUNTER — Encounter: Payer: Self-pay | Admitting: Internal Medicine

## 2014-04-11 ENCOUNTER — Other Ambulatory Visit: Payer: Self-pay | Admitting: Obstetrics & Gynecology

## 2014-04-12 ENCOUNTER — Encounter: Payer: Self-pay | Admitting: Internal Medicine

## 2014-04-12 LAB — CYTOLOGY - PAP

## 2014-06-20 ENCOUNTER — Ambulatory Visit (AMBULATORY_SURGERY_CENTER): Payer: Self-pay

## 2014-06-20 VITALS — Ht 62.5 in | Wt 165.2 lb

## 2014-06-20 DIAGNOSIS — K921 Melena: Secondary | ICD-10-CM

## 2014-06-20 MED ORDER — MOVIPREP 100 G PO SOLR: ORAL | Status: DC

## 2014-06-20 NOTE — Progress Notes (Signed)
Per pt, no allergies to soy or egg products.Pt not taking any weight loss meds or using  O2 at home. 

## 2014-07-04 ENCOUNTER — Other Ambulatory Visit (INDEPENDENT_AMBULATORY_CARE_PROVIDER_SITE_OTHER): Payer: PRIVATE HEALTH INSURANCE

## 2014-07-04 ENCOUNTER — Encounter: Payer: Self-pay | Admitting: Internal Medicine

## 2014-07-04 ENCOUNTER — Encounter: Payer: Self-pay | Admitting: *Deleted

## 2014-07-04 ENCOUNTER — Ambulatory Visit (AMBULATORY_SURGERY_CENTER): Payer: PRIVATE HEALTH INSURANCE | Admitting: Internal Medicine

## 2014-07-04 ENCOUNTER — Other Ambulatory Visit: Payer: Self-pay | Admitting: *Deleted

## 2014-07-04 VITALS — BP 121/82 | HR 72 | Temp 98.8°F | Resp 27 | Ht 62.5 in | Wt 165.0 lb

## 2014-07-04 DIAGNOSIS — K635 Polyp of colon: Secondary | ICD-10-CM

## 2014-07-04 DIAGNOSIS — K5120 Ulcerative (chronic) proctitis without complications: Secondary | ICD-10-CM | POA: Diagnosis not present

## 2014-07-04 DIAGNOSIS — D125 Benign neoplasm of sigmoid colon: Secondary | ICD-10-CM

## 2014-07-04 DIAGNOSIS — K51911 Ulcerative colitis, unspecified with rectal bleeding: Secondary | ICD-10-CM

## 2014-07-04 DIAGNOSIS — K921 Melena: Secondary | ICD-10-CM

## 2014-07-04 DIAGNOSIS — K529 Noninfective gastroenteritis and colitis, unspecified: Secondary | ICD-10-CM

## 2014-07-04 LAB — CBC WITH DIFFERENTIAL/PLATELET
BASOS ABS: 0.1 10*3/uL (ref 0.0–0.1)
Basophils Relative: 1.2 % (ref 0.0–3.0)
EOS PCT: 3.3 % (ref 0.0–5.0)
Eosinophils Absolute: 0.4 10*3/uL (ref 0.0–0.7)
HCT: 38.8 % (ref 36.0–46.0)
HEMOGLOBIN: 13.6 g/dL (ref 12.0–15.0)
LYMPHS PCT: 19.2 % (ref 12.0–46.0)
Lymphs Abs: 2.2 10*3/uL (ref 0.7–4.0)
MCHC: 34.9 g/dL (ref 30.0–36.0)
MCV: 93.3 fl (ref 78.0–100.0)
MONOS PCT: 14.2 % — AB (ref 3.0–12.0)
Monocytes Absolute: 1.6 10*3/uL — ABNORMAL HIGH (ref 0.1–1.0)
NEUTROS ABS: 7.1 10*3/uL (ref 1.4–7.7)
Neutrophils Relative %: 62.1 % (ref 43.0–77.0)
PLATELETS: 316 10*3/uL (ref 150.0–400.0)
RBC: 4.16 Mil/uL (ref 3.87–5.11)
RDW: 13.5 % (ref 11.5–15.5)
WBC: 11.5 10*3/uL — AB (ref 4.0–10.5)

## 2014-07-04 LAB — IBC PANEL
Iron: 27 ug/dL — ABNORMAL LOW (ref 42–145)
Saturation Ratios: 9.3 % — ABNORMAL LOW (ref 20.0–50.0)
Transferrin: 206.9 mg/dL — ABNORMAL LOW (ref 212.0–360.0)

## 2014-07-04 LAB — SEDIMENTATION RATE: Sed Rate: 16 mm/hr (ref 0–22)

## 2014-07-04 MED ORDER — SODIUM CHLORIDE 0.9 % IV SOLN: 500.0000 mL | INTRAVENOUS | Status: DC

## 2014-07-04 MED ORDER — HYDROCORTISONE 100 MG/60ML RE ENEM: 1.0000 | ENEMA | Freq: Every day | RECTAL | Status: DC

## 2014-07-04 MED ORDER — MESALAMINE 1.2 G PO TBEC: 2.4000 g | DELAYED_RELEASE_TABLET | Freq: Every day | ORAL | Status: DC

## 2014-07-04 NOTE — Progress Notes (Signed)
Called to room to assist during endoscopic procedure.  Patient ID and intended procedure confirmed with present staff. Received instructions for my participation in the procedure from the performing physician.  

## 2014-07-04 NOTE — Patient Instructions (Signed)
Discharge instructions given. Handouts on polyps and diverticulosis. Labs drawn in recovery room. Office will schedule follow up. Resume previous medications. YOU HAD AN ENDOSCOPIC PROCEDURE TODAY AT [facility]: Refer to the procedure report that was given to you for any specific questions about what was found during the examination.  If the procedure report does not answer your questions, please call your gastroenterologist to clarify.  If you requested that your care partner not be given the details of your procedure findings, then the procedure report has been included in a sealed envelope for you to review at your convenience later.  YOU SHOULD EXPECT: Some feelings of bloating in the abdomen. Passage of more gas than usual.  Walking can help get rid of the air that was put into your GI tract during the procedure and reduce the bloating. If you had a lower endoscopy (such as a colonoscopy or flexible sigmoidoscopy) you may notice spotting of blood in your stool or on the toilet paper. If you underwent a bowel prep for your procedure, then you may not have a normal bowel movement for a few days.  DIET: Your first meal following the procedure should be a light meal and then it is ok to progress to your normal diet.  A half-sandwich or bowl of soup is an example of a good first meal.  Heavy or fried foods are harder to digest and may make you feel nauseous or bloated.  Likewise meals heavy in dairy and vegetables can cause extra gas to form and this can also increase the bloating.  Drink plenty of fluids but you should avoid alcoholic beverages for 24 hours.  ACTIVITY: Your care partner should take you home directly after the procedure.  You should plan to take it easy, moving slowly for the rest of the day.  You can resume normal activity the day after the procedure however you should NOT DRIVE or use heavy machinery for 24 hours (because of the sedation medicines used during the  test).    SYMPTOMS TO REPORT IMMEDIATELY: A gastroenterologist can be reached at any hour.  During normal business hours, 8:30 AM to 5:00 PM Monday through Friday, call [phone].  After hours and on weekends, please call the GI answering service at [phone] who will take a message and have the physician on call contact you.   Following lower endoscopy (colonoscopy or flexible sigmoidoscopy):  Excessive amounts of blood in the stool  Significant tenderness or worsening of abdominal pains  Swelling of the abdomen that is new, acute  Fever of 100F or higher  FOLLOW UP: If any biopsies were taken you will be contacted by phone or by letter within the next 1-3 weeks.  Call your gastroenterologist if you have not heard about the biopsies in 3 weeks.  Our staff will call the home number listed on your records the next business day following your procedure to check on you and address any questions or concerns that you may have at that time regarding the information given to you following your procedure. This is a courtesy call and so if there is no answer at the home number and we have not heard from you through the emergency physician on call, we will assume that you have returned to your regular daily activities without incident.  SIGNATURES/CONFIDENTIALITY: You and/or your care partner have signed paperwork which will be entered into your electronic medical record.  These signatures attest to the fact that that the  information above on your After Visit Summary has been reviewed and is understood.  Full responsibility of the confidentiality of this discharge information lies with you and/or your care-partner.

## 2014-07-04 NOTE — Op Note (Signed)
[place]  [address] [place], [phone]   COLONOSCOPY PROCEDURE REPORT  PATIENT: Hannah Willis, Hannah Willis  MR#: [id number] BIRTHDATE: 09/01/1950 , 70  yrs. old GENDER: female ENDOSCOPIST: Lafayette Dragon, MD REFERRED BY:Dr Candace Smith PROCEDURE DATE:  07/04/2014 PROCEDURE:   Colonoscopy with biopsy and Colonoscopy with snare polypectomy First Screening Colonoscopy - Avg.  risk and is 50 yrs.  old or older - No.  Prior Negative Screening - Now for repeat screening. 10 or more years since last screening  History of Adenoma - Now for follow-up colonoscopy & has been > or = to 3 yrs.  N/A  Polyps Removed Today? Yes. ASA CLASS:   Class I INDICATIONS:low volume painless rectal bleeding or 5 months duration, prior colonoscopy in December 2005 showed mild diverticulosis. MEDICATIONS: Monitored anesthesia care and Propofol 200 mg IV  DESCRIPTION OF PROCEDURE:   After the risks benefits and alternatives of the procedure were thoroughly explained, informed consent was obtained.  The digital rectal exam revealed no abnormalities of the rectum.   The LB [id number]  endoscope was introduced through the anus and advanced to the cecum, which was identified by both the appendix and ileocecal valve. No adverse events experienced.   The quality of the prep was good, using MoviPrep  The instrument was then slowly withdrawn as the colon was fully examined.      COLON FINDINGS: A polypoid shaped sessile polyp measuring 9 mm in size was found in the sigmoid colon.  A polypectomy was performed with a cold snare.  The resection was complete, the polyp tissue was completely retrieved and sent to histology.   A circumferential diffuse patch of bleeding abnormal mucosa was found in the sigmoid colon and rectum.  The mucosa was congested, friable and had loss of vascularity and granularity.  This was likely consistent with ulcerative colitis disease.  Multiple biopsies were  performed using cold forceps.the most severe changes were in close 0-10 cm. Mild inflammation between 10 cm and 40 cm. Normal-appearing mucosa at about 40 cm in the descending colon transverse colon and ascending colon multiple biopsies were obtained from the cecum. Descending colon sigmoid colon and the rectum   There was moderate diverticulosis noted in the descending colon with associated tortuosity and angulation.  Retroflexed views revealed no abnormalities. The time to cecum=8 minutes 09 seconds.  Withdrawal time=10 minutes 04 seconds.  The scope was withdrawn and the procedure completed. COMPLICATIONS: There were no immediate complications.  ENDOSCOPIC IMPRESSION: 1.   Sessile polyp was found in the sigmoid colon; polypectomy was performed with a cold snare 2. Left sided ulcerative colitis,most severe from 0-10 cm, normal-appearing mucosa at about 40 cm  Circumferential diffuse abnormal mucosa was found in the sigmoid colon and rectum; The mucosa was congested, friable, bleeding and had loss of vascularity and granularity; multiple biopsies were performed using cold forceps 3.   There was moderate diverticulosis noted in the descending colon   RECOMMENDATIONS:  1.  Await pathology results 2.  Lialda 1.2 gm 2 po qd 3 .Cort enema 1 hs, try to retain, 4.  F/up office visit in 4 weeks 5 . CBC, sed.rate, Iron studies today 6  .recall colonoscopy pending path report eSigned:  Lafayette Dragon, MD 07/04/2014 11:27 AM   cc:   PATIENT NAME:  Hannah Willis, Hannah Willis MR#: [id number]

## 2014-07-04 NOTE — Progress Notes (Signed)
Report to PACU, RN, vss, BBS= Clear.  

## 2014-07-05 ENCOUNTER — Telehealth: Payer: Self-pay

## 2014-07-05 NOTE — Telephone Encounter (Signed)
Left message on answering machine. 

## 2014-07-11 ENCOUNTER — Encounter: Payer: Self-pay | Admitting: Internal Medicine

## 2014-08-04 ENCOUNTER — Ambulatory Visit (INDEPENDENT_AMBULATORY_CARE_PROVIDER_SITE_OTHER): Payer: PRIVATE HEALTH INSURANCE | Admitting: Internal Medicine

## 2014-08-04 ENCOUNTER — Encounter: Payer: Self-pay | Admitting: Internal Medicine

## 2014-08-04 ENCOUNTER — Other Ambulatory Visit (INDEPENDENT_AMBULATORY_CARE_PROVIDER_SITE_OTHER): Payer: PRIVATE HEALTH INSURANCE

## 2014-08-04 VITALS — BP 154/90 | HR 68 | Ht 63.0 in | Wt 164.4 lb

## 2014-08-04 DIAGNOSIS — K51911 Ulcerative colitis, unspecified with rectal bleeding: Secondary | ICD-10-CM | POA: Diagnosis not present

## 2014-08-04 LAB — VITAMIN B12: VITAMIN B 12: 1300 pg/mL — AB (ref 211–911)

## 2014-08-04 LAB — CBC WITH DIFFERENTIAL/PLATELET
Basophils Absolute: 0.1 10*3/uL (ref 0.0–0.1)
Basophils Relative: 0.6 % (ref 0.0–3.0)
EOS ABS: 0.2 10*3/uL (ref 0.0–0.7)
Eosinophils Relative: 1.1 % (ref 0.0–5.0)
HEMATOCRIT: 41 % (ref 36.0–46.0)
HEMOGLOBIN: 14.8 g/dL (ref 12.0–15.0)
LYMPHS PCT: 32.8 % (ref 12.0–46.0)
Lymphs Abs: 4.5 10*3/uL — ABNORMAL HIGH (ref 0.7–4.0)
MCHC: 36.1 g/dL — ABNORMAL HIGH (ref 30.0–36.0)
MCV: 92 fl (ref 78.0–100.0)
MONOS PCT: 10 % (ref 3.0–12.0)
Monocytes Absolute: 1.4 10*3/uL — ABNORMAL HIGH (ref 0.1–1.0)
NEUTROS PCT: 55.5 % (ref 43.0–77.0)
Neutro Abs: 7.7 10*3/uL (ref 1.4–7.7)
Platelets: 284 10*3/uL (ref 150.0–400.0)
RBC: 4.46 Mil/uL (ref 3.87–5.11)
RDW: 14 % (ref 11.5–15.5)
WBC: 13.9 10*3/uL — ABNORMAL HIGH (ref 4.0–10.5)

## 2014-08-04 LAB — FERRITIN: FERRITIN: 103.4 ng/mL (ref 10.0–291.0)

## 2014-08-04 LAB — IBC PANEL
Iron: 145 ug/dL (ref 42–145)
SATURATION RATIOS: 42.4 % (ref 20.0–50.0)
Transferrin: 244 mg/dL (ref 212.0–360.0)

## 2014-08-04 LAB — FOLATE: Folate: 13.7 ng/mL (ref >5.9)

## 2014-08-04 MED ORDER — MESALAMINE 1.2 G PO TBEC: 2.4000 g | DELAYED_RELEASE_TABLET | Freq: Every day | ORAL | Status: DC

## 2014-08-04 MED ORDER — HYDROCORTISONE 100 MG/60ML RE ENEM: 1.0000 | ENEMA | RECTAL | Status: DC

## 2014-08-04 NOTE — Patient Instructions (Signed)
Your physician has requested that you go to the basement for the following lab work before leaving today:CBC, Iron studies.  We have sent the following medications to your pharmacy for you to pick up at your convenience: [place].  Continue Lialda.   Follow up with Dr. Olevia Perches in 6 months.  cc: Carol Ada, MD

## 2014-08-04 NOTE — Progress Notes (Signed)
Hannah Willis 04-07-1951 [id number]  Note: This dictation was prepared with Dragon digital system. Any transcriptional errors that result from this procedure are unintentional.   History of Present Illness: This is a 64 year old white female with new diagnosis of ulcerative colitis involving  rectosigmoid colon. Biopsies from colonoscopy in December 2015 showed  minimally active chronic colitis. She also had diverticulosis and a small polyp which was inflammatory. She was started on Lialda 1.2 g 2 pills a day and Kort enemas every night. All her symptoms of bleeding and urgency have subsided. Today she reports being asymptomatic. Hemoglobin 13.6 hematocrit 38.8. Sedimentation rate of 16 and iron saturation of 9.3%.    Past Medical History  Diagnosis Date  . Stroke 09/21/2007  . Hypertension   . Weakness of right hand     due to stroke  . Hyperlipidemia   . Basal cell carcinoma     on front chest  . Apnea, sleep     no c-pap machine  . GERD (gastroesophageal reflux disease)   . Post-operative nausea and vomiting   . Transient global amnesia   . Blood in stool     OVER 5 MONTHS  . Laryngitis     started monday night  . Ulcerative colitis     Past Surgical History  Procedure Laterality Date  . 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 Total knee arthroplasty Left   . Shoulder arthroscopy w/ rotator cuff repair Right 2010    Allergies  Allergen Reactions  . Codeine Nausea And Vomiting  . Morphine And Related     Causes hallucinations     Family history and social history have been reviewed.  Review of Systems: Negative for diarrhea, rectal bleeding. Abdominal pain  The remainder of the 10 point ROS is negative except as outlined in the H&P  Physical Exam: General Appearance Well developed, in no distress Eyes  Non icteric  HEENT  Non traumatic, normocephalic  Mouth No lesion, tongue papillated, no cheilosis Neck Supple without adenopathy,  thyroid not enlarged, no carotid bruits, no JVD Lungs Clear to auscultation bilaterally COR Normal S1, normal S2, regular rhythm, no murmur, quiet precordium Abdomen soft, nontender, normoactive bowel sounds. No tenderness. No distention Rectal not done Extremities  No pedal edema Skin No lesions Neurological Alert and oriented x 3 Psychological Normal mood and affect  Assessment and Plan:   64 year old white female with new diagnosis of  proctocolitis which responded to combination of Cort enemas and Mesalamine orally.Marland Kitchen She will reduce the Cort  to once a week maintenance dose for 8 weeks then discontinue. She will continue mesalamine 2.4 g until she sees me in 6 months. Today we will check CBC and iron levels. Recall colonoscopy in 5 years    Hannah Willis @TODAY (<PARAMETER> error)@

## 2014-12-19 ENCOUNTER — Encounter: Payer: Self-pay | Admitting: Internal Medicine

## 2015-03-19 ENCOUNTER — Telehealth: Payer: Self-pay | Admitting: *Deleted

## 2015-03-19 DIAGNOSIS — K51911 Ulcerative colitis, unspecified with rectal bleeding: Secondary | ICD-10-CM

## 2015-03-19 NOTE — Telephone Encounter (Signed)
Dr Havery Moros- This former Dr Olevia Perches patient would like refills of Lialda 2 tabs daily (last had filled 02/03/15). She has an upcoming appointment with you 05/2015. May I fill under you until upcoming appointment?

## 2015-03-19 NOTE — Telephone Encounter (Signed)
Yes, no problem that's fine. I will otherwise see her at clinic visit or sooner with questions/concerns.

## 2015-03-20 MED ORDER — MESALAMINE 1.2 G PO TBEC: 2.4000 g | DELAYED_RELEASE_TABLET | Freq: Every day | ORAL | Status: DC

## 2015-03-20 NOTE — Telephone Encounter (Signed)
Rx sent 

## 2015-04-04 ENCOUNTER — Other Ambulatory Visit: Payer: Self-pay | Admitting: Family Medicine

## 2015-04-04 ENCOUNTER — Other Ambulatory Visit: Payer: Self-pay

## 2015-04-05 LAB — CMP12+LP+TP+TSH+6AC+CBC/D/PLT
A/G RATIO: 1.9 (ref 1.1–2.5)
ALK PHOS: 81 IU/L (ref 39–117)
ALT: 25 IU/L (ref 0–32)
AST: 28 IU/L (ref 0–40)
Albumin: 4.1 g/dL (ref 3.6–4.8)
BASOS ABS: 0.2 10*3/uL (ref 0.0–0.2)
BASOS: 2 %
BILIRUBIN TOTAL: 0.9 mg/dL (ref 0.0–1.2)
BUN / CREAT RATIO: 16 (ref 11–26)
BUN: 10 mg/dL (ref 8–27)
CHLORIDE: 99 mmol/L (ref 97–108)
Calcium: 10 mg/dL (ref 8.7–10.3)
Chol/HDL Ratio: 2.9 ratio units (ref 0.0–4.4)
Cholesterol, Total: 201 mg/dL — ABNORMAL HIGH (ref 100–199)
Creatinine, Ser: 0.64 mg/dL (ref 0.57–1.00)
EOS (ABSOLUTE): 0.4 10*3/uL (ref 0.0–0.4)
Eos: 6 %
Estimated CHD Risk: <0.5 times avg. (ref 0.0–1.0)
Free Thyroxine Index: 2 (ref 1.2–4.9)
GFR calc non Af Amer: 95 mL/min/{1.73_m2} (ref >59)
GFR, EST AFRICAN AMERICAN: 109 mL/min/{1.73_m2} (ref >59)
GGT: 19 IU/L (ref 0–60)
GLUCOSE: 89 mg/dL (ref 65–99)
Globulin, Total: 2.2 g/dL (ref 1.5–4.5)
HDL: 69 mg/dL (ref >39)
HEMATOCRIT: 40.2 % (ref 34.0–46.6)
Hemoglobin: 14.2 g/dL (ref 11.1–15.9)
IMMATURE GRANULOCYTES: 1 %
IRON: 168 ug/dL — AB (ref 27–139)
Immature Grans (Abs): 0 10*3/uL (ref 0.0–0.1)
LDH: 192 IU/L (ref 119–226)
LDL Calculated: 114 mg/dL — ABNORMAL HIGH (ref 0–99)
Lymphocytes Absolute: 2.5 10*3/uL (ref 0.7–3.1)
Lymphs: 33 %
MCH: 32.1 pg (ref 26.6–33.0)
MCHC: 35.3 g/dL (ref 31.5–35.7)
MCV: 91 fL (ref 79–97)
MONOCYTES: 12 %
Monocytes Absolute: 0.9 10*3/uL (ref 0.1–0.9)
NEUTROS PCT: 46 %
Neutrophils Absolute: 3.6 10*3/uL (ref 1.4–7.0)
PLATELETS: 366 10*3/uL (ref 150–379)
Phosphorus: 3.6 mg/dL (ref 2.5–4.5)
Potassium: 4.5 mmol/L (ref 3.5–5.2)
RBC: 4.43 x10E6/uL (ref 3.77–5.28)
RDW: 13.2 % (ref 12.3–15.4)
SODIUM: 137 mmol/L (ref 134–144)
T3 UPTAKE RATIO: 28 % (ref 24–39)
T4, Total: 7.1 ug/dL (ref 4.5–12.0)
TSH: 0.978 u[IU]/mL (ref 0.450–4.500)
Total Protein: 6.3 g/dL (ref 6.0–8.5)
Triglycerides: 89 mg/dL (ref 0–149)
Uric Acid: 3.4 mg/dL (ref 2.5–7.1)
VLDL CHOLESTEROL CAL: 18 mg/dL (ref 5–40)
WBC: 7.6 10*3/uL (ref 3.4–10.8)

## 2015-04-05 LAB — HGB A1C W/O EAG: Hgb A1c MFr Bld: 5.4 % (ref 4.8–5.6)

## 2015-05-11 ENCOUNTER — Ambulatory Visit (INDEPENDENT_AMBULATORY_CARE_PROVIDER_SITE_OTHER): Payer: PRIVATE HEALTH INSURANCE | Admitting: Gastroenterology

## 2015-05-11 ENCOUNTER — Encounter: Payer: Self-pay | Admitting: Gastroenterology

## 2015-05-11 VITALS — BP 150/76 | HR 64 | Ht 63.0 in | Wt 169.5 lb

## 2015-05-11 DIAGNOSIS — K5180 Other ulcerative colitis without complications: Secondary | ICD-10-CM | POA: Diagnosis not present

## 2015-05-11 MED ORDER — MESALAMINE 4 G RE ENEM: ENEMA | RECTAL | Status: DC

## 2015-05-11 NOTE — Progress Notes (Signed)
HPI :  64 y/o female here for follow up. Former patient of Dr. Olevia Perches, new patient to me. She has a history of left sided UC diagnosed on colonoscopy December 2015 as outlined below. She was initially treated with Lialda 2.4gm daily as well as Hydrocortizone enemas which helped her symptoms but has not resolved it.   Patient she is doing okay but has some chronic active symptoms. She is having 5-6 BMs in the morning, and then nothing the rest of the day. She thinks she has had this for the past year and a half. She has some increased urgency to use the bathroom. She denies blood in the stools. Stools are formed mostly. She does have some abdominal cramps which can come and go. She reports has been off the hydrocortizone enemas since one month after the diagnosis. She is taking 2.4 gm Lialda daily and that's it for her colitis.   She takes arthritis and has been taking Aleve for this. She takes Aleve once every few days or so. She is using topical creams as well for her arthritis. She otherwise takes an aspirin 370m daily for history of CVA, which occurred 7 years ago. She thnks she is noncompliant with with Lialda and doesn't take it every day. .   Colonoscopy December 2015 - left sided colitis, path c/w chronic active colitis    Past Medical History  Diagnosis Date  . [place]  . Hypertension   . Weakness of right hand     due to stroke  . Hyperlipidemia   . Basal cell carcinoma     on front chest  . Apnea, sleep     no c-pap machine  . GERD (gastroesophageal reflux disease)   . Post-operative nausea and vomiting   . Transient global amnesia   . Blood in stool     OVER 5 MONTHS  . Laryngitis     started monday night  . Ulcerative colitis ([facility]      Past Surgical History  Procedure Laterality Date  . 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 Total knee arthroplasty Left   . Shoulder arthroscopy w/ rotator cuff repair Right 2010   Family  History  Problem Relation Age of Onset  . Colon cancer Neg Hx   . Esophageal cancer Neg Hx   . Rectal cancer Neg Hx   . Stomach cancer Neg Hx   . Heart disease Paternal Grandmother   . Diabetes Paternal Grandmother   . Diabetes Father   . Mental illness Father   . Narcolepsy Father   . Heart attack Maternal Grandfather   . Stroke Maternal Grandmother    Social History  Substance Use Topics  . Smoking status: Former Smoker    Types: Cigarettes    Quit date: 06/20/1989  . Smokeless tobacco: Never Used     Comment: smoked occasional cigarettes for 5 years  . Alcohol Use: No   Current Outpatient Prescriptions  Medication Sig Dispense Refill  . AMBULATORY NON FORMULARY MEDICATION Panothetic Acid B5 Take 1 tablet by mouth once daily    . aspirin 325 MG tablet Take 325 mg by mouth daily.    .Marland KitchenCALCIUM PO Take 1 tablet by mouth daily.    . Cholecalciferol (VITAMIN D PO) Take 1 tablet by mouth daily.    . Cyanocobalamin (VITAMIN B-12 PO) Take 1 tablet by mouth daily.    . ferrous sulfate 325 (65 FE)  MG tablet Take 325 mg by mouth daily with breakfast.    . mesalamine (LIALDA) 1.2 G EC tablet Take 2 tablets (2.4 g total) by mouth daily. 60 tablet 1  . metoprolol succinate (TOPROL-XL) 100 MG 24 hr tablet Take 100 mg by mouth daily. Take with or immediately following a meal.    . Misc Natural Products (OSTEO BI-FLEX TRIPLE STRENGTH) TABS Take 1 tablet by mouth daily.    Marland Kitchen POTASSIUM PO Take 100 mg by mouth daily. OTC potassium    . Pyridoxine HCl (VITAMIN B-6 PO) Take 1 tablet by mouth daily.    . simvastatin (ZOCOR) 20 MG tablet Take 20 mg by mouth daily.    Marland Kitchen thiamine (VITAMIN B-1) 100 MG tablet Take 100 mg by mouth daily.    . vitamin E 100 UNIT capsule Take by mouth daily.     No current facility-administered medications for this visit.   Allergies  Allergen Reactions  . Codeine Nausea And Vomiting  . Morphine And Related     Causes hallucinations      Review of Systems: All  systems reviewed and negative except where noted in HPI.   Lab Results  Component Value Date   WBC 7.6 04/04/2015   HGB 14.8 08/04/2014   HCT 40.2 04/04/2015   MCV 92.0 08/04/2014   PLT 284.0 08/04/2014    Lab Results  Component Value Date   CREATININE 0.64 04/04/2015   BUN 10 04/04/2015   NA 137 04/04/2015   K 4.5 04/04/2015   CL 99 04/04/2015   CO2 25 10/06/2011    Lab Results  Component Value Date   ALT 25 04/04/2015   AST 28 04/04/2015   GGT 19 04/04/2015   ALKPHOS 81 04/04/2015   BILITOT 0.9 04/04/2015     Physical Exam: BP 150/76 mmHg  Pulse 64  Ht 5' 3"  (1.6 m)  Wt 169 lb 8 oz (76.885 kg)  BMI 30.03 kg/m2 Constitutional: Pleasant,well-developed, female in no acute distress. HEENT: Normocephalic and atraumatic. Conjunctivae are normal. No scleral icterus. Neck supple.  Cardiovascular: Normal rate, regular rhythm.  Pulmonary/chest: Effort normal and breath sounds normal. No wheezing, rales or rhonchi. Abdominal: Soft, nondistended, nontender. Bowel sounds active throughout. There are no masses palpable. No hepatomegaly. Extremities: no edema Lymphadenopathy: No cervical adenopathy noted. Neurological: Alert and oriented to person place and time. Skin: Skin is warm and dry. No rashes noted. Psychiatric: Normal mood and affect. Behavior is normal.   ASSESSMENT AND PLAN: 63 y/o female with left sided UC diagnosed within the past year. She is stable but has some ongoing active symptoms. She endorses noncompliance with Lialda and discussed importance of taking this daily. I otherwise recommend she stop the Aleve she is using frequently which can flare colitis. She needs to be on aspirin for her history of CVA, if colitis becomes poorly controlled in the future could consider switch to Plavix in this light but would continue aspirin for now and hold other NSAIDs. I otherwise recommend she add Rowasa to her regimen every night for the next 2 weeks, and then as needed  to see if this helps. I would like to see her in 3-6 months for reassessment, or sooner if she has any flares or worsening of symptoms. She agreed.   Carson Cellar, MD [facility] Pager [phone]

## 2015-05-11 NOTE — Patient Instructions (Signed)
We have sent the following medications to your pharmacy for you to pick up at your convenience: Rowasa  Stop taking Aleve.

## 2015-05-21 ENCOUNTER — Other Ambulatory Visit: Payer: Self-pay | Admitting: Obstetrics & Gynecology

## 2015-05-21 DIAGNOSIS — R928 Other abnormal and inconclusive findings on diagnostic imaging of breast: Secondary | ICD-10-CM

## 2015-05-29 ENCOUNTER — Ambulatory Visit
Admission: RE | Admit: 2015-05-29 | Discharge: 2015-05-29 | Disposition: A | Discharge status: Home / Self Care | Payer: No Typology Code available for payment source | Source: Ambulatory Visit | Attending: Obstetrics & Gynecology | Admitting: Obstetrics & Gynecology

## 2015-05-29 DIAGNOSIS — R928 Other abnormal and inconclusive findings on diagnostic imaging of breast: Secondary | ICD-10-CM

## 2015-06-06 ENCOUNTER — Other Ambulatory Visit: Payer: Self-pay | Admitting: Gastroenterology

## 2015-10-12 ENCOUNTER — Telehealth: Payer: Self-pay | Admitting: Gastroenterology

## 2015-10-12 MED ORDER — MESALAMINE 4 G RE ENEM: ENEMA | RECTAL | Status: DC

## 2015-10-12 MED ORDER — MESALAMINE 1.2 G PO TBEC: DELAYED_RELEASE_TABLET | ORAL | Status: DC

## 2015-10-12 NOTE — Telephone Encounter (Signed)
If se has Rowasa enemas still would do those nightly also - can refill those if necessary Other option is a Canasa suppository 1000 mg qhs # 30 no RF

## 2015-10-12 NOTE — Telephone Encounter (Signed)
Spoke with patient and she states her stools have gone from dark green to stools with BRB x 1 week. States her stool color has been changing over the last several weeks. Patient states she ran out of Lialda and did not have refills so she has not been taking it. She states did not understand that with diagnosis of UC she needed to continue it. Refilled medication for her to restart. Scheduled OV with Nicoletta Ba, PA on 10/22/15 at 1:30 PM.(next available) Please, advise if needs further orders. DOD- Carlean Purl.

## 2015-10-12 NOTE — Telephone Encounter (Signed)
Spoke with patient and sent rx in for Rowasa enema's nightly.

## 2015-10-12 NOTE — Telephone Encounter (Signed)
Left a message for patient to call back. 

## 2015-10-22 ENCOUNTER — Ambulatory Visit (INDEPENDENT_AMBULATORY_CARE_PROVIDER_SITE_OTHER): Payer: No Typology Code available for payment source | Admitting: Gastroenterology

## 2015-10-22 ENCOUNTER — Encounter: Payer: Self-pay | Admitting: Gastroenterology

## 2015-10-22 ENCOUNTER — Other Ambulatory Visit (INDEPENDENT_AMBULATORY_CARE_PROVIDER_SITE_OTHER): Payer: No Typology Code available for payment source

## 2015-10-22 VITALS — BP 134/72 | HR 76 | Ht 62.6 in | Wt 166.1 lb

## 2015-10-22 DIAGNOSIS — K51919 Ulcerative colitis, unspecified with unspecified complications: Secondary | ICD-10-CM | POA: Insufficient documentation

## 2015-10-22 DIAGNOSIS — K51819 Other ulcerative colitis with unspecified complications: Secondary | ICD-10-CM | POA: Diagnosis not present

## 2015-10-22 DIAGNOSIS — R197 Diarrhea, unspecified: Secondary | ICD-10-CM

## 2015-10-22 DIAGNOSIS — K625 Hemorrhage of anus and rectum: Secondary | ICD-10-CM

## 2015-10-22 DIAGNOSIS — K5190 Ulcerative colitis, unspecified, without complications: Secondary | ICD-10-CM | POA: Insufficient documentation

## 2015-10-22 LAB — CBC WITH DIFFERENTIAL/PLATELET
BASOS ABS: 0 10*3/uL (ref 0.0–0.1)
BASOS PCT: 0.4 % (ref 0.0–3.0)
EOS ABS: 0.6 10*3/uL (ref 0.0–0.7)
Eosinophils Relative: 4.8 % (ref 0.0–5.0)
HEMATOCRIT: 41.1 % (ref 36.0–46.0)
Hemoglobin: 14.6 g/dL (ref 12.0–15.0)
LYMPHS PCT: 27.3 % (ref 12.0–46.0)
Lymphs Abs: 3.2 10*3/uL (ref 0.7–4.0)
MCHC: 35.6 g/dL (ref 30.0–36.0)
MCV: 92 fl (ref 78.0–100.0)
MONO ABS: 1.5 10*3/uL — AB (ref 0.1–1.0)
Monocytes Relative: 13 % — ABNORMAL HIGH (ref 3.0–12.0)
NEUTROS ABS: 6.4 10*3/uL (ref 1.4–7.7)
NEUTROS PCT: 54.5 % (ref 43.0–77.0)
PLATELETS: 333 10*3/uL (ref 150.0–400.0)
RBC: 4.47 Mil/uL (ref 3.87–5.11)
RDW: 14.4 % (ref 11.5–15.5)
WBC: 11.7 10*3/uL — AB (ref 4.0–10.5)

## 2015-10-22 LAB — COMPREHENSIVE METABOLIC PANEL
ALBUMIN: 4.1 g/dL (ref 3.5–5.2)
ALT: 15 U/L (ref 0–35)
AST: 21 U/L (ref 0–37)
Alkaline Phosphatase: 72 U/L (ref 39–117)
BUN: 14 mg/dL (ref 6–23)
CALCIUM: 10.1 mg/dL (ref 8.4–10.5)
CHLORIDE: 103 meq/L (ref 96–112)
CO2: 27 mEq/L (ref 19–32)
CREATININE: 0.66 mg/dL (ref 0.40–1.20)
GFR: 95.59 mL/min (ref >60.00)
Glucose, Bld: 82 mg/dL (ref 70–99)
POTASSIUM: 4.2 meq/L (ref 3.5–5.1)
SODIUM: 138 meq/L (ref 135–145)
Total Bilirubin: 0.9 mg/dL (ref 0.2–1.2)
Total Protein: 7.3 g/dL (ref 6.0–8.3)

## 2015-10-22 LAB — TSH: TSH: 0.92 u[IU]/mL (ref 0.35–4.50)

## 2015-10-22 LAB — C-REACTIVE PROTEIN: CRP: 1 mg/dL (ref 0.5–20.0)

## 2015-10-22 LAB — SEDIMENTATION RATE: SED RATE: 9 mm/h (ref 0–22)

## 2015-10-22 NOTE — Progress Notes (Signed)
     10/22/2015 Jarvis Knodel Six [id number] 31-Jan-1951   History of Present Illness:  This is a 65 year old female recently new to Dr. Havery Moros in November 2016 for treatment of her ulcerative colitis. Previously she followed with Dr. Olevia Perches. She had been taking Lialda 2.4 g daily and had used Rowasa enemas for her condition. When she was seen in November she admitted to some chronic symptoms despite that treatment, but had some noncompliance issues as well. She called our office last week stating that she was having diarrhea and some rectal bleeding along with some lower abdominal/pelvic pain. She admitted that she ran out of the [place] and did not have it refilled so had not been taking it for a couple of months. She was restarted on the Lialda at 2.4 g daily and was given Rowasa enemas to use at bedtime for 2 weeks as well. She says that she has noticed some improvement already. She is no longer having rectal bleeding and her abdominal/pelvic pain has improved. She is still having anywhere from 4-8 stools a day, mostly in the mornings before she leaves for work.  Colonoscopy December 2015 - left sided colitis, path c/w chronic active colitis.   Current Medications, Allergies, Past Medical History, Past Surgical History, Family History and Social History were reviewed in [employer] record.   Physical Exam: BP 134/72 mmHg  Pulse 76  Ht 5' 2.6" (1.59 m)  Wt 166 lb 2 oz (75.354 kg)  BMI 29.81 kg/m2 General: Well developed female in no acute distress Head: Normocephalic and atraumatic Eyes:  Sclerae anicteric, conjunctiva pink  Ears: Normal auditory acuity Lungs: Clear throughout to auscultation Heart: Regular rate and rhythm Abdomen: Soft, non-distended.  Normal bowel sounds.  Non-tender. Musculoskeletal: Symmetrical with no gross deformities  Extremities: No edema  Neurological: Alert oriented x 4, grossly non-focal Psychological:  Alert and cooperative. Normal  mood and affect  Assessment and Recommendations: -65 year old female with left sided UC diagnosed in 06/2014:  Admits to non-compliance with her Lialda and just restarted it recently at 2 pills daily along with Rowasa enemas.  Having some improvement already.  Will continue this regimen but will increase Lialda to full dose of 4.8 grams for now.  Will check stool for Cdiff and labs including CBC, CMP, sed rate, and CRP.  We will make her a follow-up wit Dr. Havery Moros in 4-6 weeks but she will call back in 10 days with update on her progress.  If no improvement or if inflammatory markers are significantly elevated then would consider Uceris.

## 2015-10-22 NOTE — Progress Notes (Signed)
Agree with assessment and plan as outlined. Hopefully higher dose Lialda helps put this into remission and C diff negative. If symptoms persist will consider Uceris course and discuss escalation of therapy at her follow up with me if needed. Thanks

## 2015-10-22 NOTE — Patient Instructions (Addendum)
Increase your current dose of Lialda to 4 tablets a day.  Continue the  Rowasa enemas for 2 weeks.   Your physician has requested that you go to the basement for lab work before leaving today.  Please call back with an update in 10 days.   Follow up with Dr Havery Moros in 4-6 weeks. 12-07-2015 @ 11:00am  If you are age 65 or older, your body mass index should be between 23-30. Your Body mass index is 29.81 kg/(m^2). If this is out of the aforementioned range listed, please consider follow up with your Primary Care Provider.  If you are age 72 or younger, your body mass index should be between 19-25. Your Body mass index is 29.81 kg/(m^2). If this is out of the aformentioned range listed, please consider follow up with your Primary Care Provider.

## 2015-10-24 LAB — CLOSTRIDIUM DIFFICILE BY PCR: Toxigenic C. Difficile by PCR: NOT DETECTED

## 2015-10-28 ENCOUNTER — Other Ambulatory Visit: Payer: Self-pay | Admitting: Gastroenterology

## 2015-11-01 ENCOUNTER — Telehealth: Payer: Self-pay | Admitting: Gastroenterology

## 2015-11-01 MED ORDER — MESALAMINE 1.2 G PO TBEC: 4.8000 g | DELAYED_RELEASE_TABLET | Freq: Every day | ORAL | Status: DC

## 2015-11-01 NOTE — Telephone Encounter (Signed)
Rx sent 

## 2015-12-04 ENCOUNTER — Telehealth: Payer: Self-pay | Admitting: Gastroenterology

## 2015-12-04 MED ORDER — MESALAMINE 1.2 G PO TBEC: 4.8000 g | DELAYED_RELEASE_TABLET | Freq: Every day | ORAL | Status: DC

## 2015-12-04 NOTE — Telephone Encounter (Signed)
Patient aware. She will let us know if surgeon needs to speak with Dr. Havery Moros.

## 2015-12-04 NOTE — Telephone Encounter (Signed)
Spoke with patient and she states she is going to have knee replacement surgery on June 26. She was told to stop all medications except her BP meds one week prior to surgery. She will have to stop the Lialda. Wanted to let Dr. Havery Moros know. She also needs a refill on this since she had to reschedule her OV to July. Refilled medications.

## 2015-12-04 NOTE — Telephone Encounter (Signed)
Unable to reach patient. No mailbox will try later.

## 2015-12-04 NOTE — Telephone Encounter (Signed)
Left a message at patient's home number(her request) for her to call back.

## 2015-12-04 NOTE — Telephone Encounter (Signed)
Hannah Willis can you please ask her to double check with her surgeon, there should be no reason to stop her Lialda prior to surgery, this will not influence her surgery or bleeding risk. She is at risk for having a flare if stopping Lialda and don't recommend she stop it. If she needs me to speak with her surgeon about this I am happy to do so. Can you please let her know if this is the case. Thanks

## 2015-12-07 ENCOUNTER — Ambulatory Visit: Payer: Self-pay | Admitting: Gastroenterology

## 2015-12-21 NOTE — H&P (Signed)
TOTAL KNEE ADMISSION H&P  Patient is being admitted for right total knee arthroplasty.  Subjective:  Chief Complaint:    Right knee primary OA / pain  HPI: Hannah Willis, 65 y.o. female, has a history of pain and functional disability in the right knee due to arthritis and has failed non-surgical conservative treatments for greater than 12 weeks to include NSAID's and/or analgesics, corticosteriod injections and activity modification.  Onset of symptoms was gradual, starting 2+ years ago with gradually worsening course since that time. The patient noted no past surgery on the right knee(s).  Patient currently rates pain in the right knee(s) at 10 out of 10 with activity. Patient has worsening of pain with activity and weight bearing, pain that interferes with activities of daily living, pain with passive range of motion, crepitus and joint swelling.  Patient has evidence of periarticular osteophytes and joint space narrowing by imaging studies. There is no active infection.   Risks, benefits and expectations were discussed with the patient.  Risks including but not limited to the risk of anesthesia, blood clots, nerve damage, blood vessel damage, failure of the prosthesis, infection and up to and including death.  Patient understand the risks, benefits and expectations and wishes to proceed with surgery.    PCP: Reginia Naas, MD  D/C Plans:      Home with HHPT  Post-op Meds:       No Rx given  Tranexamic Acid:      To be given - IV   Decadron:      Is to be given  FYI:     Xarelto then ASA ( previous stroke)  Tramadol and APAP  Toradol  Dilaudid ok per the patient    Patient Active Problem List   Diagnosis Date Noted  . Diarrhea 10/22/2015  . Ulcerative colitis with complication ([place]) 48/54/6270  . Rectal bleeding 10/22/2015  . SPHEROCYTOSIS 07/19/2009  . ANEMIA 07/19/2009  . GERD 07/19/2009  . MEMORY LOSS 07/19/2009  . HEADACHE, CHRONIC 07/19/2009  . MITRAL VALVE  PROLAPSE 07/18/2009  . ABDOMINAL PAIN, UNSPECIFIED SITE 03/21/2009  . HYPERLIPIDEMIA 03/20/2009  . LEUKOCYTOSIS 03/20/2009  . HYPERTENSION 03/20/2009  . CVA (STROKE) 03/20/2009  . DIVERTICULOSIS, COLON 03/20/2009  . GI BLEEDING 03/20/2009   Past Medical History  Diagnosis Date  . Stroke ([place]) 09/21/2007  . Hypertension   . Weakness of right hand     due to stroke  . Hyperlipidemia   . Basal cell carcinoma     on front chest  . Apnea, sleep     no c-pap machine  . GERD (gastroesophageal reflux disease)   . Post-operative nausea and vomiting   . Transient global amnesia   . Blood in stool     OVER 5 MONTHS  . Laryngitis     started monday night  . Ulcerative colitis [facility])     Past Surgical History  Procedure Laterality Date  . 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 Total knee arthroplasty Left   . Shoulder arthroscopy w/ rotator cuff repair Right 2010    No prescriptions prior to admission   Allergies  Allergen Reactions  . Codeine Nausea And Vomiting  . Morphine And Related     Causes hallucinations     Social History  Substance Use Topics  . Smoking status: Former Smoker    Types: Cigarettes    Quit date: 06/20/1989  . Smokeless tobacco: Never Used  Comment: smoked occasional cigarettes for 5 years  . Alcohol Use: No    Family History  Problem Relation Age of Onset  . Colon cancer Neg Hx   . Esophageal cancer Neg Hx   . Rectal cancer Neg Hx   . Stomach cancer Neg Hx   . Heart disease Paternal Grandmother   . Diabetes Paternal Grandmother   . Diabetes Father   . Mental illness Father   . Narcolepsy Father   . Heart attack Maternal Grandfather   . Stroke Maternal Grandmother      Review of Systems  Constitutional: Negative.   Eyes: Negative.   Respiratory: Negative.   Cardiovascular: Negative.   Gastrointestinal: Positive for heartburn and diarrhea.  Genitourinary: Negative.   Musculoskeletal: Positive for joint pain.   Skin: Negative.   Neurological: Positive for headaches.  Endo/Heme/Allergies: Negative.   Psychiatric/Behavioral: Positive for memory loss.    Objective:  Physical Exam  Constitutional: She is oriented to person, place, and time. She appears well-developed.  HENT:  Head: Normocephalic.  Eyes: Pupils are equal, round, and reactive to light.  Neck: Neck supple. No JVD present. No tracheal deviation present. No thyromegaly present.  Cardiovascular: Normal rate, regular rhythm and intact distal pulses.   Murmur heard. Respiratory: Effort normal and breath sounds normal. No stridor. No respiratory distress. She has no wheezes.  GI: Soft. There is no tenderness. There is no guarding.  Musculoskeletal:       Right knee: She exhibits decreased range of motion, swelling, abnormal alignment and bony tenderness. She exhibits no ecchymosis, no deformity, no laceration and no erythema. Tenderness found.  Lymphadenopathy:    She has no cervical adenopathy.  Neurological: She is alert and oriented to person, place, and time.  Skin: Skin is warm and dry.  Psychiatric: She has a normal mood and affect.       Labs:  Estimated body mass index is 29.81 kg/(m^2) as calculated from the following:   Height as of 10/22/15: 5' 2.6" (1.59 m).   Weight as of 10/22/15: 75.354 kg (166 lb 2 oz).   Imaging Review Plain radiographs demonstrate severe degenerative joint disease of the right knee(s). The overall alignment is valgus. The bone quality appears to be good for age and reported activity level.  Assessment/Plan:  End stage arthritis, right knee   The patient history, physical examination, clinical judgment of the provider and imaging studies are consistent with end stage degenerative joint disease of the right knee(s) and total knee arthroplasty is deemed medically necessary. The treatment options including medical management, injection therapy arthroscopy and arthroplasty were discussed at  length. The risks and benefits of total knee arthroplasty were presented and reviewed. The risks due to aseptic loosening, infection, stiffness, patella tracking problems, thromboembolic complications and other imponderables were discussed. The patient acknowledged the explanation, agreed to proceed with the plan and consent was signed. Patient is being admitted for inpatient treatment for surgery, pain control, PT, OT, prophylactic antibiotics, VTE prophylaxis, progressive ambulation and ADL's and discharge planning. The patient is planning to be discharged home with home health services.     [place] Mykhia Danish   PA-C  12/21/2015, 3:22 PM

## 2015-12-24 ENCOUNTER — Other Ambulatory Visit (HOSPITAL_COMMUNITY): Payer: Self-pay | Admitting: *Deleted

## 2015-12-24 NOTE — Patient Instructions (Addendum)
Hannah Willis  12/24/2015   Your procedure is scheduled on: 12-31-15  Report to [facility] Main  Entrance take [facility]  elevators to 3rd floor to  [place] at 930 AM.  Call this number if you have problems the morning of surgery [phone]   Remember: ONLY 1 PERSON MAY GO WITH YOU TO SHORT STAY TO GET  READY MORNING OF [name].  Do not eat food or drink liquids :After Midnight.     Take these medicines the morning of surgery with A SIP OF WATER: METOPROLOL SUCCINATE (TOPROL XL), SIMVASTATIN, LIALDA PER DR AMBRUSTER GI                               You may not have any metal on your body including hair pins and              piercings  Do not wear jewelry, make-up, lotions, powders or perfumes, deodorant             Do not wear nail polish.  Do not shave  48 hours prior to surgery.              Men may shave face and neck.   Do not bring valuables to the hospital. Willis.  Contacts, dentures or bridgework may not be worn into surgery.  Leave suitcase in the car. After surgery it may be brought to your room.                  Please read over the following fact sheets you were given: _____________________________________________________________________             [facility] - Preparing for Surgery Before surgery, you can play an important role.  Because skin is not sterile, your skin needs to be as free of germs as possible.  You can reduce the number of germs on your skin by washing with CHG (chlorahexidine gluconate) soap before surgery.  CHG is an antiseptic cleaner which kills germs and bonds with the skin to continue killing germs even after washing. Please DO NOT use if you have an allergy to CHG or antibacterial soaps.  If your skin becomes reddened/irritated stop using the CHG and inform your nurse when you arrive at Short Stay. Do not shave (including legs and underarms) for at least  48 hours prior to the first CHG shower.  You may shave your face/neck. Please follow these instructions carefully:  1.  Shower with CHG Soap the night before surgery and the  morning of Surgery.  2.  If you choose to wash your hair, wash your hair first as usual with your  normal  shampoo.  3.  After you shampoo, rinse your hair and body thoroughly to remove the  shampoo.                           4.  Use CHG as you would any other liquid soap.  You can apply chg directly  to the skin and wash                       Gently with a scrungie or clean  washcloth.  5.  Apply the CHG Soap to your body ONLY FROM THE NECK DOWN.   Do not use on face/ open                           Wound or open sores. Avoid contact with eyes, ears mouth and genitals (private parts).                       Wash face,  Genitals (private parts) with your normal soap.             6.  Wash thoroughly, paying special attention to the area where your surgery  will be performed.  7.  Thoroughly rinse your body with warm water from the neck down.  8.  DO NOT shower/wash with your normal soap after using and rinsing off  the CHG Soap.                9.  Pat yourself dry with a clean towel.            10.  Wear clean pajamas.            11.  Place clean sheets on your bed the night of your first shower and do not  sleep with pets. Day of Surgery : Do not apply any lotions/deodorants the morning of surgery.  Please wear clean clothes to the hospital/surgery center.  FAILURE TO FOLLOW THESE INSTRUCTIONS MAY RESULT IN THE CANCELLATION OF YOUR SURGERY PATIENT SIGNATURE_________________________________  NURSE SIGNATURE__________________________________  ________________________________________________________________________   Hannah Willis  An incentive spirometer is a tool that can help keep your lungs clear and active. This tool measures how well you are filling your lungs with each breath. Taking long deep breaths may  help reverse or decrease the chance of developing breathing (pulmonary) problems (especially infection) following:  A long period of time when you are unable to move or be active. BEFORE THE PROCEDURE   If the spirometer includes an indicator to show your best effort, your nurse or respiratory therapist will set it to a desired goal.  If possible, sit up straight or lean slightly forward. Try not to slouch.  Hold the incentive spirometer in an upright position. INSTRUCTIONS FOR USE   Sit on the edge of your bed if possible, or sit up as far as you can in bed or on a chair.  Hold the incentive spirometer in an upright position.  Breathe out normally.  Place the mouthpiece in your mouth and seal your lips tightly around it.  Breathe in slowly and as deeply as possible, raising the piston or the ball toward the top of the column.  Hold your breath for 3-5 seconds or for as long as possible. Allow the piston or ball to fall to the bottom of the column.  Remove the mouthpiece from your mouth and breathe out normally.  Rest for a few seconds and repeat Steps 1 through 7 at least 10 times every 1-2 hours when you are awake. Take your time and take a few normal breaths between deep breaths.  The spirometer may include an indicator to show your best effort. Use the indicator as a goal to work toward during each repetition.  After each set of 10 deep breaths, practice coughing to be sure your lungs are clear. If you have an incision (the cut made at the time of surgery), support your incision when coughing by  placing a pillow or rolled up towels firmly against it. Once you are able to get out of bed, walk around indoors and cough well. You may stop using the incentive spirometer when instructed by your caregiver.  RISKS AND COMPLICATIONS  Take your time so you do not get dizzy or light-headed.  If you are in pain, you may need to take or ask for pain medication before doing incentive  spirometry. It is harder to take a deep breath if you are having pain. AFTER USE  Rest and breathe slowly and easily.  It can be helpful to keep track of a log of your progress. Your caregiver can provide you with a simple table to help with this. If you are using the spirometer at home, follow these instructions: [place] IF:   You are having difficultly using the spirometer.  You have trouble using the spirometer as often as instructed.  Your pain medication is not giving enough relief while using the spirometer.  You develop fever of 100.5 F (38.1 C) or higher. SEEK IMMEDIATE MEDICAL CARE IF:   You cough up bloody sputum that had not been present before.  You develop fever of 102 F (38.9 C) or greater.  You develop worsening pain at or near the incision site. MAKE SURE YOU:   Understand these instructions.  Will watch your condition.  Will get help right away if you are not doing well or get worse. Document Released: 11/03/2006 Document Revised: 09/15/2011 Document Reviewed: 01/04/2007 ExitCare Patient Information 2014 ExitCare, [place].   ________________________________________________________________________  WHAT IS A BLOOD TRANSFUSION? Blood Transfusion Information  A transfusion is the replacement of blood or some of its parts. Blood is made up of multiple cells which provide different functions.  Red blood cells carry oxygen and are used for blood loss replacement.  White blood cells fight against infection.  Platelets control bleeding.  Plasma helps clot blood.  Other blood products are available for specialized needs, such as hemophilia or other clotting disorders. BEFORE THE TRANSFUSION  Who gives blood for transfusions?   Healthy volunteers who are fully evaluated to make sure their blood is safe. This is blood bank blood. Transfusion therapy is the safest it has ever been in the practice of medicine. Before blood is taken from a donor, a  complete history is taken to make sure that person has no history of diseases nor engages in risky social behavior (examples are intravenous drug use or sexual activity with multiple partners). The donor's travel history is screened to minimize risk of transmitting infections, such as malaria. The donated blood is tested for signs of infectious diseases, such as HIV and hepatitis. The blood is then tested to be sure it is compatible with you in order to minimize the chance of a transfusion reaction. If you or a relative donates blood, this is often done in anticipation of surgery and is not appropriate for emergency situations. It takes many days to process the donated blood. RISKS AND COMPLICATIONS Although transfusion therapy is very safe and saves many lives, the main dangers of transfusion include:   Getting an infectious disease.  Developing a transfusion reaction. This is an allergic reaction to something in the blood you were given. Every precaution is taken to prevent this. The decision to have a blood transfusion has been considered carefully by your caregiver before blood is given. Blood is not given unless the benefits outweigh the risks. AFTER THE TRANSFUSION  Right after receiving a blood  transfusion, you will usually feel much better and more energetic. This is especially true if your red blood cells have gotten low (anemic). The transfusion raises the level of the red blood cells which carry oxygen, and this usually causes an energy increase.  The nurse administering the transfusion will monitor you carefully for complications. HOME CARE INSTRUCTIONS  No special instructions are needed after a transfusion. You may find your energy is better. Speak with your caregiver about any limitations on activity for underlying diseases you may have. SEEK MEDICAL CARE IF:   Your condition is not improving after your transfusion.  You develop redness or irritation at the intravenous (IV)  site. SEEK IMMEDIATE MEDICAL CARE IF:  Any of the following symptoms occur over the next 12 hours:  Shaking chills.  You have a temperature by mouth above 102 F (38.9 C), not controlled by medicine.  Chest, back, or muscle pain.  People around you feel you are not acting correctly or are confused.  Shortness of breath or difficulty breathing.  Dizziness and fainting.  You get a rash or develop hives.  You have a decrease in urine output.  Your urine turns a dark color or changes to pink, red, or brown. Any of the following symptoms occur over the next 10 days:  You have a temperature by mouth above 102 F (38.9 C), not controlled by medicine.  Shortness of breath.  Weakness after normal activity.  The white part of the eye turns yellow (jaundice).  You have a decrease in the amount of urine or are urinating less often.  Your urine turns a dark color or changes to pink, red, or brown. Document Released: 06/20/2000 Document Revised: 09/15/2011 Document Reviewed: 02/07/2008 [facility] Patient Information 2014 [place], [place].  _______________________________________________________________________

## 2015-12-24 NOTE — Progress Notes (Signed)
MEDICAL CLEARANCE NOTE  DR [facility] ON CHART FOR 12-31-15 SURGERY EKG 12-19-15 DR CANDACE SMITH ON CHART LOV DR CANDANCE SMITH 12-19-15 ON CHART

## 2015-12-25 ENCOUNTER — Encounter (HOSPITAL_COMMUNITY)
Admission: RE | Admit: 2015-12-25 | Discharge: 2015-12-25 | Disposition: A | Discharge status: Home / Self Care | Payer: No Typology Code available for payment source | Source: Ambulatory Visit | Attending: Orthopedic Surgery | Admitting: Orthopedic Surgery

## 2015-12-25 ENCOUNTER — Encounter (HOSPITAL_COMMUNITY): Payer: Self-pay

## 2015-12-25 DIAGNOSIS — M25561 Pain in right knee: Secondary | ICD-10-CM | POA: Insufficient documentation

## 2015-12-25 DIAGNOSIS — M1711 Unilateral primary osteoarthritis, right knee: Secondary | ICD-10-CM | POA: Insufficient documentation

## 2015-12-25 DIAGNOSIS — Z01812 Encounter for preprocedural laboratory examination: Secondary | ICD-10-CM | POA: Insufficient documentation

## 2015-12-25 HISTORY — DX: Myoneural disorder, unspecified: G70.9

## 2015-12-25 HISTORY — DX: Unspecified osteoarthritis, unspecified site: M19.90

## 2015-12-25 LAB — CBC
HCT: 40.4 % (ref 36.0–46.0)
Hemoglobin: 14.7 g/dL (ref 12.0–15.0)
MCH: 33.3 pg (ref 26.0–34.0)
MCHC: 36.4 g/dL — ABNORMAL HIGH (ref 30.0–36.0)
MCV: 91.4 fL (ref 78.0–100.0)
PLATELETS: 348 10*3/uL (ref 150–400)
RBC: 4.42 MIL/uL (ref 3.87–5.11)
RDW: 13 % (ref 11.5–15.5)
WBC: 8.9 10*3/uL (ref 4.0–10.5)

## 2015-12-25 LAB — BASIC METABOLIC PANEL
Anion gap: 6 (ref 5–15)
BUN: 11 mg/dL (ref 6–20)
CALCIUM: 9.3 mg/dL (ref 8.9–10.3)
CHLORIDE: 107 mmol/L (ref 101–111)
CO2: 25 mmol/L (ref 22–32)
CREATININE: 0.58 mg/dL (ref 0.44–1.00)
Glucose, Bld: 125 mg/dL — ABNORMAL HIGH (ref 65–99)
Potassium: 4.2 mmol/L (ref 3.5–5.1)
SODIUM: 138 mmol/L (ref 135–145)

## 2015-12-25 LAB — SURGICAL PCR SCREEN
MRSA, PCR: NEGATIVE
STAPHYLOCOCCUS AUREUS: POSITIVE — AB

## 2015-12-29 DIAGNOSIS — M65941 Unspecified synovitis and tenosynovitis, right hand: Secondary | ICD-10-CM

## 2015-12-29 DIAGNOSIS — M659 Synovitis and tenosynovitis, unspecified: Secondary | ICD-10-CM

## 2015-12-29 HISTORY — DX: Unspecified synovitis and tenosynovitis, right hand: M65.941

## 2015-12-29 HISTORY — DX: Synovitis and tenosynovitis, unspecified: M65.9

## 2015-12-31 ENCOUNTER — Inpatient Hospital Stay (HOSPITAL_COMMUNITY): Payer: No Typology Code available for payment source | Admitting: Anesthesiology

## 2015-12-31 ENCOUNTER — Encounter (HOSPITAL_COMMUNITY): Payer: Self-pay

## 2015-12-31 ENCOUNTER — Encounter (HOSPITAL_COMMUNITY)
Admission: RE | Disposition: A | Discharge status: Home Health | Payer: Self-pay | Source: Ambulatory Visit | Attending: Orthopedic Surgery

## 2015-12-31 ENCOUNTER — Inpatient Hospital Stay (HOSPITAL_COMMUNITY)
Admission: RE | Admit: 2015-12-31 | Discharge: 2016-01-01 | DRG: 470 | Disposition: A | Discharge status: Home Health | Payer: No Typology Code available for payment source | Source: Ambulatory Visit | Attending: Orthopedic Surgery | Admitting: Orthopedic Surgery

## 2015-12-31 DIAGNOSIS — I10 Essential (primary) hypertension: Secondary | ICD-10-CM | POA: Diagnosis present

## 2015-12-31 DIAGNOSIS — Z9081 Acquired absence of spleen: Secondary | ICD-10-CM | POA: Diagnosis not present

## 2015-12-31 DIAGNOSIS — M1711 Unilateral primary osteoarthritis, right knee: Principal | ICD-10-CM | POA: Diagnosis present

## 2015-12-31 DIAGNOSIS — I739 Peripheral vascular disease, unspecified: Secondary | ICD-10-CM | POA: Diagnosis present

## 2015-12-31 DIAGNOSIS — Z96652 Presence of left artificial knee joint: Secondary | ICD-10-CM | POA: Diagnosis present

## 2015-12-31 DIAGNOSIS — K219 Gastro-esophageal reflux disease without esophagitis: Secondary | ICD-10-CM | POA: Diagnosis present

## 2015-12-31 DIAGNOSIS — G4730 Sleep apnea, unspecified: Secondary | ICD-10-CM | POA: Diagnosis present

## 2015-12-31 DIAGNOSIS — Z8673 Personal history of transient ischemic attack (TIA), and cerebral infarction without residual deficits: Secondary | ICD-10-CM | POA: Diagnosis not present

## 2015-12-31 DIAGNOSIS — Z96651 Presence of right artificial knee joint: Secondary | ICD-10-CM

## 2015-12-31 DIAGNOSIS — M25561 Pain in right knee: Secondary | ICD-10-CM | POA: Diagnosis present

## 2015-12-31 DIAGNOSIS — Z87891 Personal history of nicotine dependence: Secondary | ICD-10-CM

## 2015-12-31 DIAGNOSIS — Z96659 Presence of unspecified artificial knee joint: Secondary | ICD-10-CM

## 2015-12-31 HISTORY — PX: TOTAL KNEE ARTHROPLASTY: SHX125

## 2015-12-31 HISTORY — DX: Synovitis and tenosynovitis, unspecified: M65.9

## 2015-12-31 LAB — TYPE AND SCREEN
ABO/RH(D): O POS
ANTIBODY SCREEN: NEGATIVE

## 2015-12-31 SURGERY — ARTHROPLASTY, KNEE, TOTAL
Anesthesia: Monitor Anesthesia Care | Site: Knee | Laterality: Right

## 2015-12-31 MED ORDER — SIMVASTATIN 20 MG PO TABS
20.0000 mg | ORAL_TABLET | Freq: Every day | ORAL | Status: DC
  Administered 2016-01-01: 20 mg via ORAL
  Filled 2015-12-31: qty 1

## 2015-12-31 MED ORDER — ONDANSETRON HCL 4 MG PO TABS: 4.0000 mg | ORAL_TABLET | Freq: Four times a day (QID) | ORAL | Status: DC | PRN

## 2015-12-31 MED ORDER — FERROUS SULFATE 325 (65 FE) MG PO TABS
325.0000 mg | ORAL_TABLET | Freq: Three times a day (TID) | ORAL | Status: DC
  Administered 2016-01-01: 325 mg via ORAL
  Filled 2015-12-31: qty 1

## 2015-12-31 MED ORDER — FENTANYL CITRATE (PF) 100 MCG/2ML IJ SOLN
INTRAMUSCULAR | Status: DC | PRN
  Administered 2015-12-31: 100 ug via INTRAVENOUS

## 2015-12-31 MED ORDER — DOCUSATE SODIUM 100 MG PO CAPS: 100.0000 mg | ORAL_CAPSULE | Freq: Two times a day (BID) | ORAL | Status: DC

## 2015-12-31 MED ORDER — KETOROLAC TROMETHAMINE 15 MG/ML IJ SOLN
15.0000 mg | Freq: Four times a day (QID) | INTRAMUSCULAR | Status: AC
  Administered 2015-12-31 – 2016-01-01 (×4): 15 mg via INTRAVENOUS
  Filled 2015-12-31 (×4): qty 1

## 2015-12-31 MED ORDER — ALUM & MAG HYDROXIDE-SIMETH 200-200-20 MG/5ML PO SUSP: 30.0000 mL | ORAL | Status: DC | PRN

## 2015-12-31 MED ORDER — LACTATED RINGERS IV SOLN
INTRAVENOUS | Status: DC
  Administered 2015-12-31 (×3): via INTRAVENOUS

## 2015-12-31 MED ORDER — CEFAZOLIN SODIUM-DEXTROSE 2-4 GM/100ML-% IV SOLN
2.0000 g | Freq: Four times a day (QID) | INTRAVENOUS | Status: AC
  Administered 2015-12-31 – 2016-01-01 (×2): 2 g via INTRAVENOUS
  Filled 2015-12-31: qty 100

## 2015-12-31 MED ORDER — SODIUM CHLORIDE 0.9 % IJ SOLN
INTRAMUSCULAR | Status: DC | PRN
  Administered 2015-12-31: 30 mL

## 2015-12-31 MED ORDER — ONDANSETRON HCL 4 MG/2ML IJ SOLN: 4.0000 mg | Freq: Four times a day (QID) | INTRAMUSCULAR | Status: DC | PRN

## 2015-12-31 MED ORDER — HYDROMORPHONE HCL 1 MG/ML IJ SOLN
INTRAMUSCULAR | Status: AC
  Filled 2015-12-31: qty 1

## 2015-12-31 MED ORDER — ACETAMINOPHEN 500 MG PO TABS: 1000.0000 mg | ORAL_TABLET | Freq: Three times a day (TID) | ORAL | Status: DC

## 2015-12-31 MED ORDER — FENTANYL CITRATE (PF) 100 MCG/2ML IJ SOLN
INTRAMUSCULAR | Status: AC
  Filled 2015-12-31: qty 2

## 2015-12-31 MED ORDER — MIDAZOLAM HCL 2 MG/2ML IJ SOLN
INTRAMUSCULAR | Status: AC
  Filled 2015-12-31: qty 2

## 2015-12-31 MED ORDER — BUPIVACAINE-EPINEPHRINE (PF) 0.25% -1:200000 IJ SOLN
INTRAMUSCULAR | Status: DC | PRN
  Administered 2015-12-31: 30 mL

## 2015-12-31 MED ORDER — POLYETHYLENE GLYCOL 3350 17 G PO PACK: 17.0000 g | PACK | Freq: Two times a day (BID) | ORAL | Status: DC

## 2015-12-31 MED ORDER — SODIUM CHLORIDE 0.9 % IV SOLN
INTRAVENOUS | Status: DC
  Administered 2015-12-31 – 2016-01-01 (×2): via INTRAVENOUS
  Filled 2015-12-31 (×4): qty 1000

## 2015-12-31 MED ORDER — RIVAROXABAN 10 MG PO TABS
10.0000 mg | ORAL_TABLET | ORAL | Status: DC
  Administered 2016-01-01: 10 mg via ORAL
  Filled 2015-12-31: qty 1

## 2015-12-31 MED ORDER — BUPIVACAINE IN DEXTROSE 0.75-8.25 % IT SOLN
INTRATHECAL | Status: DC | PRN
  Administered 2015-12-31: 2 mL via INTRATHECAL

## 2015-12-31 MED ORDER — TRAMADOL HCL 50 MG PO TABS: 50.0000 mg | ORAL_TABLET | Freq: Four times a day (QID) | ORAL | Status: DC | PRN

## 2015-12-31 MED ORDER — PROMETHAZINE HCL 25 MG/ML IJ SOLN: 6.2500 mg | INTRAMUSCULAR | Status: DC | PRN

## 2015-12-31 MED ORDER — METHOCARBAMOL 500 MG PO TABS: 500.0000 mg | ORAL_TABLET | Freq: Four times a day (QID) | ORAL | Status: DC | PRN

## 2015-12-31 MED ORDER — FERROUS SULFATE 325 (65 FE) MG PO TABS: 325.0000 mg | ORAL_TABLET | Freq: Three times a day (TID) | ORAL | Status: DC

## 2015-12-31 MED ORDER — PHENOL 1.4 % MT LIQD
1.0000 | OROMUCOSAL | Status: DC | PRN
  Filled 2015-12-31: qty 177

## 2015-12-31 MED ORDER — HYDROMORPHONE HCL 1 MG/ML IJ SOLN
0.5000 mg | INTRAMUSCULAR | Status: DC | PRN
  Administered 2015-12-31: 0.5 mg via INTRAVENOUS
  Filled 2015-12-31: qty 1

## 2015-12-31 MED ORDER — ACETAMINOPHEN 500 MG PO TABS
1000.0000 mg | ORAL_TABLET | Freq: Three times a day (TID) | ORAL | Status: DC
  Administered 2015-12-31 – 2016-01-01 (×3): 1000 mg via ORAL
  Filled 2015-12-31 (×3): qty 2

## 2015-12-31 MED ORDER — TRANEXAMIC ACID 1000 MG/10ML IV SOLN
1000.0000 mg | Freq: Once | INTRAVENOUS | Status: AC
  Administered 2015-12-31: 1000 mg via INTRAVENOUS
  Filled 2015-12-31: qty 10

## 2015-12-31 MED ORDER — DOCUSATE SODIUM 100 MG PO CAPS
100.0000 mg | ORAL_CAPSULE | Freq: Two times a day (BID) | ORAL | Status: DC
  Administered 2015-12-31 – 2016-01-01 (×2): 100 mg via ORAL
  Filled 2015-12-31 (×2): qty 1

## 2015-12-31 MED ORDER — METOCLOPRAMIDE HCL 5 MG PO TABS: 5.0000 mg | ORAL_TABLET | Freq: Three times a day (TID) | ORAL | Status: DC | PRN

## 2015-12-31 MED ORDER — BISACODYL 10 MG RE SUPP: 10.0000 mg | Freq: Every day | RECTAL | Status: DC | PRN

## 2015-12-31 MED ORDER — DEXAMETHASONE SODIUM PHOSPHATE 10 MG/ML IJ SOLN
10.0000 mg | Freq: Once | INTRAMUSCULAR | Status: AC
  Administered 2015-12-31: 10 mg via INTRAVENOUS

## 2015-12-31 MED ORDER — HYDROMORPHONE HCL 1 MG/ML IJ SOLN
0.2500 mg | INTRAMUSCULAR | Status: DC | PRN
  Administered 2015-12-31: 0.5 mg via INTRAVENOUS

## 2015-12-31 MED ORDER — MENTHOL 3 MG MT LOZG: 1.0000 | LOZENGE | OROMUCOSAL | Status: DC | PRN

## 2015-12-31 MED ORDER — SODIUM CHLORIDE 0.9 % IJ SOLN
INTRAMUSCULAR | Status: AC
Start: 2015-12-31 — End: 2015-12-31
  Filled 2015-12-31: qty 50

## 2015-12-31 MED ORDER — STERILE WATER FOR IRRIGATION IR SOLN
Status: DC | PRN
  Administered 2015-12-31: 2000 mL

## 2015-12-31 MED ORDER — METOCLOPRAMIDE HCL 5 MG/ML IJ SOLN: 5.0000 mg | Freq: Three times a day (TID) | INTRAMUSCULAR | Status: DC | PRN

## 2015-12-31 MED ORDER — TRANEXAMIC ACID 1000 MG/10ML IV SOLN
1000.0000 mg | INTRAVENOUS | Status: DC
  Filled 2015-12-31: qty 10

## 2015-12-31 MED ORDER — CEFAZOLIN SODIUM-DEXTROSE 2-4 GM/100ML-% IV SOLN
2.0000 g | INTRAVENOUS | Status: AC
  Administered 2015-12-31: 2 g via INTRAVENOUS

## 2015-12-31 MED ORDER — METOPROLOL SUCCINATE ER 50 MG PO TB24
50.0000 mg | ORAL_TABLET | Freq: Every day | ORAL | Status: DC
  Administered 2016-01-01: 50 mg via ORAL
  Filled 2015-12-31: qty 1

## 2015-12-31 MED ORDER — MIDAZOLAM HCL 5 MG/5ML IJ SOLN
INTRAMUSCULAR | Status: DC | PRN
  Administered 2015-12-31: 2 mg via INTRAVENOUS

## 2015-12-31 MED ORDER — POLYETHYLENE GLYCOL 3350 17 G PO PACK
17.0000 g | PACK | Freq: Two times a day (BID) | ORAL | Status: DC
  Administered 2015-12-31 – 2016-01-01 (×2): 17 g via ORAL
  Filled 2015-12-31 (×2): qty 1

## 2015-12-31 MED ORDER — CEFAZOLIN SODIUM-DEXTROSE 2-4 GM/100ML-% IV SOLN
INTRAVENOUS | Status: AC
  Filled 2015-12-31: qty 100

## 2015-12-31 MED ORDER — KETOROLAC TROMETHAMINE 30 MG/ML IJ SOLN
INTRAMUSCULAR | Status: DC | PRN
  Administered 2015-12-31: 30 mg

## 2015-12-31 MED ORDER — KETOROLAC TROMETHAMINE 30 MG/ML IJ SOLN
INTRAMUSCULAR | Status: AC
  Filled 2015-12-31: qty 1

## 2015-12-31 MED ORDER — MAGNESIUM CITRATE PO SOLN: 1.0000 | Freq: Once | ORAL | Status: DC | PRN

## 2015-12-31 MED ORDER — BUPIVACAINE-EPINEPHRINE 0.25% -1:200000 IJ SOLN
INTRAMUSCULAR | Status: AC
  Filled 2015-12-31: qty 1

## 2015-12-31 MED ORDER — METHOCARBAMOL 1000 MG/10ML IJ SOLN
500.0000 mg | Freq: Four times a day (QID) | INTRAVENOUS | Status: DC | PRN
  Administered 2015-12-31: 500 mg via INTRAVENOUS
  Filled 2015-12-31 (×2): qty 5

## 2015-12-31 MED ORDER — RIVAROXABAN 10 MG PO TABS: 10.0000 mg | ORAL_TABLET | ORAL | Status: DC

## 2015-12-31 MED ORDER — SODIUM CHLORIDE 0.9 % IR SOLN
Status: DC | PRN
  Administered 2015-12-31: 1000 mL

## 2015-12-31 MED ORDER — TRAMADOL HCL 50 MG PO TABS
50.0000 mg | ORAL_TABLET | Freq: Four times a day (QID) | ORAL | Status: DC
  Administered 2016-01-01 (×2): 50 mg via ORAL
  Filled 2015-12-31 (×3): qty 1

## 2015-12-31 MED ORDER — DEXAMETHASONE SODIUM PHOSPHATE 10 MG/ML IJ SOLN
10.0000 mg | Freq: Once | INTRAMUSCULAR | Status: AC
  Administered 2016-01-01: 10 mg via INTRAVENOUS
  Filled 2015-12-31: qty 1

## 2015-12-31 MED ORDER — PROPOFOL 500 MG/50ML IV EMUL
INTRAVENOUS | Status: DC | PRN
  Administered 2015-12-31: 75 ug/kg/min via INTRAVENOUS

## 2015-12-31 MED ORDER — DIPHENHYDRAMINE HCL 25 MG PO CAPS: 25.0000 mg | ORAL_CAPSULE | Freq: Four times a day (QID) | ORAL | Status: DC | PRN

## 2015-12-31 MED ORDER — 0.9 % SODIUM CHLORIDE (POUR BTL) OPTIME
TOPICAL | Status: DC | PRN
  Administered 2015-12-31: 1000 mL

## 2015-12-31 MED ORDER — PROPOFOL 10 MG/ML IV BOLUS
INTRAVENOUS | Status: AC
Start: 2015-12-31 — End: 2015-12-31
  Filled 2015-12-31: qty 20

## 2015-12-31 SURGICAL SUPPLY — 45 items
BAG SPEC THK2 15X12 ZIP CLS (MISCELLANEOUS) ×1
BAG ZIPLOCK 12X15 (MISCELLANEOUS) ×2 IMPLANT
BANDAGE ACE 6X5 VEL STRL LF (GAUZE/BANDAGES/DRESSINGS) ×3 IMPLANT
BLADE SAW SGTL 13.0X1.19X90.0M (BLADE) ×3 IMPLANT
BOWL SMART MIX CTS (DISPOSABLE) ×3 IMPLANT
CAPT KNEE TOTAL 3 ATTUNE ×2 IMPLANT
CEMENT HV SMART SET (Cement) ×4 IMPLANT
CLOTH BEACON ORANGE TIMEOUT ST (SAFETY) ×3 IMPLANT
CUFF TOURN SGL QUICK 34 (TOURNIQUET CUFF) ×3
CUFF TRNQT CYL 34X4X40X1 (TOURNIQUET CUFF) ×1 IMPLANT
DECANTER SPIKE VIAL GLASS SM (MISCELLANEOUS) ×3 IMPLANT
DRAPE U-SHAPE 47X51 STRL (DRAPES) ×3 IMPLANT
DRESSING AQUACEL AG SP 3.5X10 (GAUZE/BANDAGES/DRESSINGS) ×1 IMPLANT
DRSG AQUACEL AG SP 3.5X10 (GAUZE/BANDAGES/DRESSINGS) ×3
DURAPREP 26ML APPLICATOR (WOUND CARE) ×6 IMPLANT
ELECT REM PT RETURN 9FT ADLT (ELECTROSURGICAL) ×3
ELECTRODE REM PT RTRN 9FT ADLT (ELECTROSURGICAL) ×1 IMPLANT
GLOVE BIOGEL PI IND STRL 7.0 (GLOVE) IMPLANT
GLOVE BIOGEL PI IND STRL 7.5 (GLOVE) ×1 IMPLANT
GLOVE BIOGEL PI IND STRL 8.5 (GLOVE) ×1 IMPLANT
GLOVE BIOGEL PI INDICATOR 7.0 (GLOVE) ×2
GLOVE BIOGEL PI INDICATOR 7.5 (GLOVE) ×8
GLOVE BIOGEL PI INDICATOR 8.5 (GLOVE) ×4
GLOVE ECLIPSE 8.0 STRL XLNG CF (GLOVE) ×5 IMPLANT
GLOVE ORTHO TXT STRL SZ7.5 (GLOVE) ×6 IMPLANT
GLOVE SURG SS PI 6.5 STRL IVOR (GLOVE) ×2 IMPLANT
GLOVE SURG SS PI 7.0 STRL IVOR (GLOVE) ×4 IMPLANT
GOWN STRL REUS W/TWL LRG LVL3 (GOWN DISPOSABLE) ×3 IMPLANT
GOWN STRL REUS W/TWL XL LVL3 (GOWN DISPOSABLE) ×7 IMPLANT
HANDPIECE INTERPULSE COAX TIP (DISPOSABLE) ×3
LIQUID BAND (GAUZE/BANDAGES/DRESSINGS) ×3 IMPLANT
MANIFOLD NEPTUNE II (INSTRUMENTS) ×3 IMPLANT
PACK TOTAL KNEE CUSTOM (KITS) ×3 IMPLANT
POSITIONER SURGICAL ARM (MISCELLANEOUS) ×3 IMPLANT
SET HNDPC FAN SPRY TIP SCT (DISPOSABLE) ×1 IMPLANT
SET PAD KNEE POSITIONER (MISCELLANEOUS) ×3 IMPLANT
SUT MNCRL AB 4-0 PS2 18 (SUTURE) ×3 IMPLANT
SUT VIC AB 1 CT1 36 (SUTURE) ×3 IMPLANT
SUT VIC AB 2-0 CT1 27 (SUTURE) ×9
SUT VIC AB 2-0 CT1 TAPERPNT 27 (SUTURE) ×3 IMPLANT
SUT VLOC 180 0 24IN GS25 (SUTURE) ×3 IMPLANT
SYR 50ML LL SCALE MARK (SYRINGE) ×3 IMPLANT
TRAY FOLEY W/METER SILVER 14FR (SET/KITS/TRAYS/PACK) ×3 IMPLANT
WRAP KNEE MAXI GEL POST OP (GAUZE/BANDAGES/DRESSINGS) ×3 IMPLANT
YANKAUER SUCT BULB TIP 10FT TU (MISCELLANEOUS) ×3 IMPLANT

## 2015-12-31 NOTE — Anesthesia Procedure Notes (Signed)
Spinal Patient location during procedure: OR Start time: 12/31/2015 12:31 PM End time: 12/31/2015 12:40 PM Staffing Anesthesiologist: Duane Boston Other anesthesia staff: Gean Maidens Performed by: anesthesiologist  Preanesthetic Checklist Completed: patient identified, surgical consent, pre-op evaluation, timeout performed, IV checked, risks and benefits discussed and monitors and equipment checked Spinal Block Patient position: sitting Prep: Betadine Patient monitoring: cardiac monitor, continuous pulse ox and blood pressure Approach: right paramedian Location: L3-4 Injection technique: single-shot Needle Needle type: Pencan  Needle gauge: 24 G Needle length: 9 cm Additional Notes Functioning IV was confirmed and monitors were applied. Sterile prep and drape, including hand hygiene and sterile gloves were used. The patient was positioned and the spine was prepped. The skin was anesthetized with lidocaine.  Free flow of clear CSF was obtained prior to injecting local anesthetic into the CSF.  The spinal needle aspirated freely following injection.  The needle was carefully withdrawn.  The patient tolerated the procedure well.

## 2015-12-31 NOTE — Progress Notes (Signed)
Dr Alvan Dame into evaluate right hand and wrist

## 2015-12-31 NOTE — Progress Notes (Signed)
Hannah Willis arrived to short stay with complaints of right hand pain.  Right hand/wirst is swollen, red and warm to touch.  She is unable to move her fingers.  Her right hand/right wrist is painful to touch.  She denies falling and is unsure of why this is happening.  [employer] notified and he stated he would inform Dr. Alvan Dame.

## 2015-12-31 NOTE — Anesthesia Postprocedure Evaluation (Signed)
Anesthesia Post Note  Patient: Hannah Willis  Procedure(s) Performed: Procedure(s) (LRB): RIGHT TOTAL KNEE ARTHROPLASTY (Right)  Patient location during evaluation: PACU Anesthesia Type: Spinal and MAC Level of consciousness: awake and alert Pain management: pain level controlled Vital Signs Assessment: post-procedure vital signs reviewed and stable Respiratory status: spontaneous breathing and respiratory function stable Cardiovascular status: blood pressure returned to baseline and stable Postop Assessment: spinal receding Anesthetic complications: no    Last Vitals:  Filed Vitals:   12/31/15 1430 12/31/15 1445  BP: 149/72 152/66  Pulse: 86 84  Temp:    Resp: 21 15    Last Pain:  Filed Vitals:   12/31/15 1456  PainSc: 8                  Davonda Ausley DANIEL

## 2015-12-31 NOTE — Discharge Instructions (Addendum)

## 2015-12-31 NOTE — Op Note (Signed)
NAME:  [facility]                      MEDICAL RECORD NO.:  [id number]                             FACILITY:  [facility]      PHYSICIAN:  Pietro Cassis. Alvan Dame, M.D.  DATE OF BIRTH:  1950-11-18      DATE OF PROCEDURE:  12/31/2015                                     OPERATIVE REPORT         PREOPERATIVE DIAGNOSIS:  Right knee osteoarthritis.      POSTOPERATIVE DIAGNOSIS:  Right knee osteoarthritis.      FINDINGS:  The patient was noted to have complete loss of cartilage and   bone-on-bone arthritis with associated osteophytes in the lateral and patellofemoral compartments of   the knee with a significant synovitis and associated effusion.      PROCEDURE:  Right total knee replacement.      COMPONENTS USED:  DePuy Attune rotating platform posterior stabilized knee   system, a size 5N femur, 3 tibia, size 6 mm PS AOX insert, and 35 aantomic patellar   button.      SURGEON:  Pietro Cassis. Alvan Dame, M.D.      ASSISTANT:  Danae Orleans, PA-C.      ANESTHESIA:  Spinal.      SPECIMENS:  None.      COMPLICATION:  None.      DRAINS:  One Hemovac.  EBL: <100cc      TOURNIQUET TIME:   Total Tourniquet Time Documented: Thigh (Right) - 26 minutes Total: Thigh (Right) - 26 minutes  .      The patient was stable to the recovery room.      INDICATION FOR PROCEDURE:  Hannah Willis is a 65 y.o. female patient of   mine.  The patient had been seen, evaluated, and treated conservatively in the   office with medication, activity modification, and injections.  The patient had   radiographic changes of bone-on-bone arthritis with endplate sclerosis and osteophytes noted.      The patient failed conservative measures including medication, injections, and activity modification, and at this point was ready for more definitive measures.   Based on the radiographic changes and failed conservative measures, the patient   decided to proceed with total knee replacement.  Risks of infection,   DVT,  component failure, need for revision surgery, postop course, and   expectations were all   discussed and reviewed.  Consent was obtained for benefit of pain   relief.      PROCEDURE IN DETAIL:  The patient was brought to the operative theater.   Once adequate anesthesia, preoperative antibiotics, 2 gm of Ancef, 1 gm of Tranexamic ACid, and 10 mg of Decadron administered, the patient was positioned supine with the right thigh tourniquet placed.  The  right lower extremity was prepped and draped in sterile fashion.  A time-   out was performed identifying the patient, planned procedure, and   extremity.      The right lower extremity was placed in the [facility] leg holder.  The leg was   exsanguinated, tourniquet elevated to 250 mmHg.  A midline incision was  made followed by median parapatellar arthrotomy.  Following initial   exposure, attention was first directed to the patella.  Precut   measurement was noted to be 23 mm associated with synchondrosis of a bipartite patella.  I resected down to 14 mm and used a   35 anatomic patellar button to restore patellar height as well as cover the cut   surface.  I also resected the unfused superior lateral portion of the patella.     The lug holes were drilled and a metal shim was placed to protect the   patella from retractors and saw blades.      At this point, attention was now directed to the femur.  The femoral   canal was opened with a drill, irrigated to try to prevent fat emboli.  An   intramedullary rod was passed at 3 degrees valgus, 8 mm of bone was   resected off the distal femur due to pre-operative hyperextension.  Following this resection, the tibia was   subluxated anteriorly.  Using the extramedullary guide, 2 mm of bone was resected off   the proximal lateral tibia.  We confirmed the gap would be   stable medially and laterally with a size 5 mm insert as well as confirmed   the cut was perpendicular in the coronal plane, checking  with an alignment rod.      Once this was done, I sized the femur to be a size 5 in the anterior-   posterior dimension, chose a narrow component based on medial and   lateral dimension.  The size 5 rotation block was then pinned in   position anterior referenced using the C-clamp to set rotation.  The   anterior, posterior, and  chamfer cuts were made without difficulty nor   notching making certain that I was along the anterior cortex to help   with flexion gap stability.      The final box cut was made off the lateral aspect of distal femur.      At this point, the tibia was sized to be a size 3, the size 3 tray was   then pinned in position through the medial third of the tubercle,   drilled, and keel punched.  Trial reduction was now carried with a 5 femur,  3 tibia, a size 6 mm PS AOX insert, and the 35 anatomic patella botton.  The knee was brought to   extension, full extension with good flexion stability with the patella   tracking through the trochlea without application of pressure.  Given   all these findings the femoral lug holes were drilled and then the trial components removed.  Final components were   opened and cement was mixed.  The knee was irrigated with normal saline   solution and pulse lavage.  The synovial lining was   then injected with 30 cc of 0.25% Marcaine with epinephrine and 1 cc of Toradol plus 30 cc of NS for a total of 61 cc.      The knee was irrigated.  Final implants were then cemented onto clean and   dried cut surfaces of bone with the knee brought to extension with a size 6 mm trial insert.      Once the cement had fully cured, the excess cement was removed   throughout the knee.  I confirmed I was satisfied with the range of   motion and stability, and the final size 6 mm PS AOX insert was  chosen.  It was   placed into the knee.      The tourniquet had been let down at 26 minutes.  No significant   hemostasis required.  The medium Hemovac  drain was placed deep.  The   extensor mechanism was then reapproximated using #1 Vicryl and #0 V-lock sutures with the knee   in flexion.  The   remaining wound was closed with 2-0 Vicryl and running 4-0 Monocryl.   The knee was cleaned, dried, dressed sterilely using Dermabond and   Aquacel dressing.  The patient was then   brought to recovery room in stable condition, tolerating the procedure   well.   Please note that Physician Assistant, Danae Orleans, PA-C, was present for the entirety of the case, and was utilized for pre-operative positioning, peri-operative retractor management, general facilitation of the procedure.  He was also utilized for primary wound closure at the end of the case.              Pietro Cassis Alvan Dame, M.D.    12/31/2015 1:44 PM

## 2015-12-31 NOTE — Transfer of Care (Signed)
Immediate Anesthesia Transfer of Care Note  Patient: Hannah Willis  Procedure(s) Performed: Procedure(s): RIGHT TOTAL KNEE ARTHROPLASTY (Right)  Patient Location: PACU  Anesthesia Type:Spinal  Level of Consciousness: awake, alert  and oriented  Airway & Oxygen Therapy: Patient Spontanous Breathing and Patient connected to face mask oxygen  Post-op Assessment: Report given to RN and Post -op Vital signs reviewed and stable  Post vital signs: Reviewed and stable  Last Vitals:  Filed Vitals:   12/31/15 0935  BP: 155/78  Pulse: 80  Temp: 37 C  Resp: 16    Last Pain:  Filed Vitals:   12/31/15 1019  PainSc: 8       Patients Stated Pain Goal: 4 (83/50/75 7322)  Complications: No apparent anesthesia complications

## 2015-12-31 NOTE — Anesthesia Preprocedure Evaluation (Addendum)
Anesthesia Evaluation  Patient identified by MRN, date of birth, ID band Patient awake    Reviewed: Allergy & Precautions, NPO status , Patient's Chart, lab work & pertinent test results, reviewed documented beta blocker date and time   History of Anesthesia Complications (+) PONV and history of anesthetic complications  Airway Mallampati: II  TM Distance: >3 FB Neck ROM: Full    Dental  (+) Caps, Dental Advisory Given   Pulmonary sleep apnea , former smoker,    Pulmonary exam normal        Cardiovascular hypertension, Pt. on medications and Pt. on home beta blockers + Peripheral Vascular Disease  Normal cardiovascular exam     Neuro/Psych CVA, Residual Symptoms negative psych ROS   GI/Hepatic Neg liver ROS, PUD, GERD  ,  Endo/Other  negative endocrine ROS  Renal/GU negative Renal ROS     Musculoskeletal   Abdominal   Peds  Hematology negative hematology ROS (+)   Anesthesia Other Findings   Reproductive/Obstetrics                            Anesthesia Physical Anesthesia Plan  ASA: III  Anesthesia Plan: MAC and Spinal   Post-op Pain Management:    Induction: Intravenous  Airway Management Planned: Natural Airway  Additional Equipment:   Intra-op Plan:   Post-operative Plan:   Informed Consent: I have reviewed the patients History and Physical, chart, labs and discussed the procedure including the risks, benefits and alternatives for the proposed anesthesia with the patient or authorized representative who has indicated his/her understanding and acceptance.   Dental advisory given  Plan Discussed with: Anesthesiologist and CRNA  Anesthesia Plan Comments:        Anesthesia Quick Evaluation

## 2015-12-31 NOTE — Interval H&P Note (Signed)
History and Physical Interval Note:  12/31/2015 11:26 AM  Hannah Willis  has presented today for surgery, with the diagnosis of right knee osteoarthritis and pain  The various methods of treatment have been discussed with the patient and family. After consideration of risks, benefits and other options for treatment, the patient has consented to  Procedure(s): RIGHT TOTAL KNEE ARTHROPLASTY (Right) as a surgical intervention .  The patient's history has been reviewed, patient examined, no change in status, stable for surgery.  I have reviewed the patient's chart and labs.  Questions were answered to the patient's satisfaction.     Mauri Pole

## 2016-01-01 LAB — BASIC METABOLIC PANEL
Anion gap: 6 (ref 5–15)
BUN: 8 mg/dL (ref 6–20)
CHLORIDE: 110 mmol/L (ref 101–111)
CO2: 22 mmol/L (ref 22–32)
Calcium: 8.5 mg/dL — ABNORMAL LOW (ref 8.9–10.3)
Creatinine, Ser: 0.39 mg/dL — ABNORMAL LOW (ref 0.44–1.00)
GFR calc Af Amer: >60 mL/min (ref >60)
Glucose, Bld: 155 mg/dL — ABNORMAL HIGH (ref 65–99)
Potassium: 3.8 mmol/L (ref 3.5–5.1)
SODIUM: 138 mmol/L (ref 135–145)

## 2016-01-01 LAB — CBC
HCT: 33 % — ABNORMAL LOW (ref 36.0–46.0)
Hemoglobin: 11.7 g/dL — ABNORMAL LOW (ref 12.0–15.0)
MCH: 32.4 pg (ref 26.0–34.0)
MCHC: 35.5 g/dL (ref 30.0–36.0)
MCV: 91.4 fL (ref 78.0–100.0)
PLATELETS: 319 10*3/uL (ref 150–400)
RBC: 3.61 MIL/uL — ABNORMAL LOW (ref 3.87–5.11)
RDW: 12.9 % (ref 11.5–15.5)
WBC: 17.5 10*3/uL — ABNORMAL HIGH (ref 4.0–10.5)

## 2016-01-01 NOTE — Evaluation (Signed)
Physical Therapy Evaluation Patient Details Name: Hannah Willis MRN: [id number] DOB: Jun 18, 1951 Today's Date: 01/01/2016   History of Present Illness  s/p R TKA; recent right wrist injury/swelling of unknown etiology--pt did fall from horse approximately 2wks ago per her report  Clinical Impression  Pt is s/p TKA resulting in the deficits listed below (see PT Problem List).   Pt will benefit from skilled PT to increase their independence and safety with mobility to allow discharge to the venue listed below.      Follow Up Recommendations Home health PT    Equipment Recommendations  Rolling walker with 5" wheels    Recommendations for Other Services       Precautions / Restrictions Precautions Precautions: Knee Required Braces or Orthoses: Other Brace/Splint Other Brace/Splint: right wrist splint Restrictions Weight Bearing Restrictions: No RLE Weight Bearing: Weight bearing as tolerated      Mobility  Bed Mobility Overal bed mobility: Modified Independent                Transfers Overall transfer level: Needs assistance Equipment used: Rolling walker (2 wheeled) Transfers: Sit to/from Stand Sit to Stand: Supervision         General transfer comment: cues for hand placement  Ambulation/Gait Ambulation/Gait assistance: Min guard Ambulation Distance (Feet): 100 Feet Assistive device: Rolling walker (2 wheeled) Gait Pattern/deviations: Step-to pattern;Step-through pattern     General Gait Details: cues for RW position; pt wanted to defer platform and felt her wrist was fine with brace in place  Stairs            Wheelchair Mobility    Modified Rankin (Stroke Patients Only)       Balance                                             Pertinent Vitals/Pain Pain Assessment: No/denies pain Pain Score: 0-No pain Pain Intervention(s): Limited activity within patient's tolerance;Monitored during session    Home Living  Family/patient expects to be discharged to:: Private residence Living Arrangements: Spouse/significant other   Type of Home: House Home Access: Stairs to enter   Technical brewer of Steps: 2 and 1  Home Layout: One level Home Equipment: None      Prior Function Level of Independence: Independent               Hand Dominance        Extremity/Trunk Assessment   Upper Extremity Assessment: Defer to OT evaluation           Lower Extremity Assessment: RLE deficits/detail RLE Deficits / Details: hip flexion and knee extension 3/5; ankle WFL; AROM 5-80*       Communication   Communication: No difficulties  Cognition Arousal/Alertness: Awake/alert Behavior During Therapy: WFL for tasks assessed/performed Overall Cognitive Status: Within Functional Limits for tasks assessed                      General Comments      Exercises Total Joint Exercises Ankle Circles/Pumps: AROM;Both;10 reps Quad Sets: AROM;10 reps Heel Slides: AROM;10 reps;Right Straight Leg Raises: AROM;Right;10 reps      Assessment/Plan    PT Assessment Patient needs continued PT services  PT Diagnosis Difficulty walking   PT Problem List Decreased strength;Decreased range of motion;Decreased activity tolerance;Decreased mobility;Decreased knowledge of use of DME;Pain  PT Treatment Interventions DME instruction;Gait  training;Stair training;Functional mobility training;Therapeutic activities;Therapeutic exercise;Patient/family education   PT Goals (Current goals can be found in the Care Plan section) Acute Rehab PT Goals Patient Stated Goal: back to life PT Goal Formulation: With patient Time For Goal Achievement: 01/03/16 Potential to Achieve Goals: Good    Frequency 7X/week   Barriers to discharge        Co-evaluation               End of Session Equipment Utilized During Treatment: Gait belt Activity Tolerance: Patient tolerated treatment well Patient left:  with call bell/phone within reach;in chair Nurse Communication: Mobility status         Time: 9906-8934 PT Time Calculation (min) (ACUTE ONLY): 27 min   Charges:   PT Evaluation $PT Eval Low Complexity: 1 Procedure PT Treatments $Gait Training: 8-22 mins   PT G Codes:        Hannah Willis 2016-01-07, 10:01 AM

## 2016-01-01 NOTE — Progress Notes (Signed)
Occupational Therapy Evaluation Patient Details Name: Hannah Willis MRN: [id number] DOB: 08/03/1950 Today's Date: 01/01/2016    History of Present Illness s/p R TKA; recent right wrist injury/swelling of unknown etiology--pt did fall from horse approximately 2wks ago per her report   Clinical Impression   All OT education completed and pt questions answered. No further OT needs; will sign off.    Follow Up Recommendations  No OT follow up;Supervision - Intermittent    Equipment Recommendations  3 in 1 bedside comode    Recommendations for Other Services       Precautions / Restrictions Precautions Precautions: Knee Required Braces or Orthoses: Other Brace/Splint Other Brace/Splint: right wrist splint Restrictions Weight Bearing Restrictions: No RLE Weight Bearing: Weight bearing as tolerated      Mobility Bed Mobility              General bed mobility comments: NT -- up in recliner  Transfers Overall transfer level: Needs assistance Equipment used: Rolling walker (2 wheeled) Transfers: Sit to/from Stand Sit to Stand: Supervision             Balance                                            ADL Overall ADL's : Needs assistance/impaired Eating/Feeding: Independent;Sitting   Grooming: Set up;Sitting           Upper Body Dressing : Set up;Sitting   Lower Body Dressing: Supervision/safety;Sit to/from stand   Toilet Transfer: Supervision/safety;Ambulation;Comfort height toilet;RW   Toileting- Clothing Manipulation and Hygiene: Supervision/safety;Sit to/from stand   Tub/ Shower Transfer: Walk-in shower;Supervision/safety;Ambulation;Rolling walker   Functional mobility during ADLs: Supervision/safety;Rolling walker       Vision     Perception     Praxis      Pertinent Vitals/Pain Pain Assessment: No/denies pain Pain Score: 0-No pain Pain Intervention(s): Limited activity within patient's tolerance;Monitored  during session     Hand Dominance Left   Extremity/Trunk Assessment Upper Extremity Assessment Upper Extremity Assessment: RUE deficits/detail RUE Deficits / Details: Shoulder/elbow WFL, wrist immobilized in R wrist splint due to injury. Edema to R wrist/digits   Lower Extremity Assessment Lower Extremity Assessment: Defer to PT evaluation        Communication Communication Communication: No difficulties   Cognition Arousal/Alertness: Awake/alert Behavior During Therapy: WFL for tasks assessed/performed Overall Cognitive Status: Within Functional Limits for tasks assessed                     General Comments       Exercises       Shoulder Instructions      Home Living Family/patient expects to be discharged to:: Private residence Living Arrangements: Spouse/significant other Available Help at Discharge: Family Type of Home: House Home Access: Stairs to enter Technical brewer of Steps: 2 and 1    Home Layout: One level     Bathroom Shower/Tub: Occupational psychologist: Standard Bathroom Accessibility: Yes How Accessible: Accessible via walker Home Equipment: None          Prior Functioning/Environment Level of Independence: Independent             OT Diagnosis: Acute pain   OT Problem List: Decreased strength;Decreased range of motion;Decreased knowledge of use of DME or AE;Pain   OT Treatment/Interventions:      OT Goals(Current goals  can be found in the care plan section) Acute Rehab OT Goals Patient Stated Goal: back to life OT Goal Formulation: All assessment and education complete, DC therapy  OT Frequency:     Barriers to D/C:            Co-evaluation              End of Session Equipment Utilized During Treatment: Rolling walker Nurse Communication: Mobility status  Activity Tolerance: Patient tolerated treatment well Patient left: in chair;with call bell/phone within reach   Time: 8902-2840 OT  Time Calculation (min): 15 min Charges:  OT General Charges $OT Visit: 1 Procedure OT Evaluation $OT Eval Low Complexity: 1 Procedure G-Codes:    Bunny Kleist A Jan 23, 2016, 12:28 PM

## 2016-01-01 NOTE — Progress Notes (Signed)
Physical Therapy Treatment Patient Details Name: Hannah Willis MRN: [id number] DOB: 1950/12/06 Today's Date: 01/01/2016    History of Present Illness s/p R TKA; recent right wrist injury/swelling of unknown etiology--pt did fall from horse approximately 2wks ago per her report    PT Comments    Practiced steps with spouse. Ready for DC.  Follow Up Recommendations  Home health PT     Equipment Recommendations       Recommendations for Other Services       Precautions / Restrictions Precautions Precautions: Knee Required Braces or Orthoses: Other Brace/Splint Other Brace/Splint: right wrist splint Restrictions Weight Bearing Restrictions: No RLE Weight Bearing: Weight bearing as tolerated    Mobility  Bed Mobility               General bed mobility comments: up in room  Transfers Overall transfer level: Needs assistance Equipment used: Rolling walker (2 wheeled) Transfers: Sit to/from Stand Sit to Stand: Supervision            Ambulation/Gait Ambulation/Gait assistance: Supervision Ambulation Distance (Feet): 50 Feet Assistive device: Rolling walker (2 wheeled) Gait Pattern/deviations: Step-to pattern;Step-through pattern     General Gait Details: cues for RW position;    Stairs Stairs: Yes Stairs assistance: Min assist Stair Management: Step to pattern;Backwards;With walker Number of Stairs: 2 General stair comments: spouse present to assist.  Wheelchair Mobility    Modified Rankin (Stroke Patients Only)       Balance                                    Cognition Arousal/Alertness: Awake/alert Behavior During Therapy: WFL for tasks assessed/performed Overall Cognitive Status: Within Functional Limits for tasks assessed                      Exercises      General Comments        Pertinent Vitals/Pain Pain Assessment: No/denies pain    Home Living Family/patient expects to be discharged to:: Private  residence Living Arrangements: Spouse/significant other Available Help at Discharge: Family Type of Home: House Home Access: Stairs to enter   Home Layout: One level Home Equipment: None      Prior Function Level of Independence: Independent          PT Goals (current goals can now be found in the care plan section) Acute Rehab PT Goals Patient Stated Goal: back to life Progress towards PT goals: Progressing toward goals    Frequency       PT Plan Current plan remains appropriate    Co-evaluation             End of Session   Activity Tolerance: Patient tolerated treatment well Patient left: with family/visitor present     Time: 5732-2025 PT Time Calculation (min) (ACUTE ONLY): 11 min  Charges:  $Gait Training: 8-22 mins                    G Codes:      Claretha Cooper 01/01/2016, 2:14 PM

## 2016-01-01 NOTE — Progress Notes (Signed)
Patient ID: Hannah Willis, female   DOB: [birth year], 65 y.o.   MRN: [id number] Subjective: 1 Day Post-Op Procedure(s) (LRB): RIGHT TOTAL KNEE ARTHROPLASTY (Right)    Patient reports pain as mild to moderate, no issues  Objective:   VITALS:   Filed Vitals:   01/01/16 1005 01/01/16 1026  BP: 141/62 143/65  Pulse: 72 71  Temp:  97.8 F (36.6 C)  Resp:  18    Neurovascular intact Incision: dressing C/D/I  LABS  Recent Labs  01/01/16 0438  HGB 11.7*  HCT 33.0*  WBC 17.5*  PLT 319     Recent Labs  01/01/16 0438  NA 138  K 3.8  BUN 8  CREATININE 0.39*  GLUCOSE 155*    No results for input(s): LABPT, INR in the last 72 hours.   Assessment/Plan: 1 Day Post-Op Procedure(s) (LRB): RIGHT TOTAL KNEE ARTHROPLASTY (Right)   Advance diet Up with therapy Discharge home with home health later today Reviewed goals RTC in 2 weeks

## 2016-01-01 NOTE — Progress Notes (Signed)
CM met with pt in room to offer choice of home health agency.  Pt chooses [facility] to render HHPT.  Referral given to [employer], Tim.  CM called [facility] DME rep, Germaine to please deliver the rolling walker and the 3n1 to room so pt can discharge. No other CM needs were communicated.

## 2016-01-04 NOTE — Discharge Summary (Signed)
Physician Discharge Summary  Patient ID: KINSHASA THROCKMORTON MRN: [id number] DOB/AGE: May 31, 1951 65 y.o.  Admit date: 12/31/2015 Discharge date: 01/01/2016   Procedures:  Procedure(s) (LRB): RIGHT TOTAL KNEE ARTHROPLASTY (Right)  Attending Physician:  Dr. Paralee Cancel   Admission Diagnoses:   Right knee primary OA / pain  Discharge Diagnoses:  Principal Problem:   S/P right TKA Active Problems:   S/P knee replacement  Past Medical History  Diagnosis Date  . Hypertension   . Weakness of right hand     due to stroke  . Hyperlipidemia   . Basal cell carcinoma     on front chest  . GERD (gastroesophageal reflux disease)   . Post-operative nausea and vomiting   . Transient global amnesia 2009  . Blood in stool     LAST 2 MONTHS, MEDS CHNAGED FOR  . Laryngitis     started monday night  . Ulcerative colitis ([place])   . Stroke ([place]) 09/21/2007  . Apnea, sleep     no c-pap machine  . Arthritis     OA  . Neuromuscular disorder ([place])   . Synovitis of right hand 12/29/15    and wrist    HPI:    Hannah Willis, 65 y.o. female, has a history of pain and functional disability in the right knee due to arthritis and has failed non-surgical conservative treatments for greater than 12 weeks to include NSAID's and/or analgesics, corticosteriod injections and activity modification. Onset of symptoms was gradual, starting 2+ years ago with gradually worsening course since that time. The patient noted no past surgery on the right knee(s). Patient currently rates pain in the right knee(s) at 10 out of 10 with activity. Patient has worsening of pain with activity and weight bearing, pain that interferes with activities of daily living, pain with passive range of motion, crepitus and joint swelling. Patient has evidence of periarticular osteophytes and joint space narrowing by imaging studies. There is no active infection. Risks, benefits and expectations were discussed with the patient. Risks  including but not limited to the risk of anesthesia, blood clots, nerve damage, blood vessel damage, failure of the prosthesis, infection and up to and including death. Patient understand the risks, benefits and expectations and wishes to proceed with surgery.   PCP: Reginia Naas, MD   Discharged Condition: good  Hospital Course:  Patient underwent the above stated procedure on 12/31/2015. Patient tolerated the procedure well and brought to the recovery room in good condition and subsequently to the floor.  POD #1 BP: 143/65 ; Pulse: 71 ; Temp: 97.8 F (36.6 C) ; Resp: 18 Patient reports pain as mild to moderate, no issues. Ready to be discharged home. Neurovascular intact and incision: dressing C/D/I  LABS  Basename    HGB     11.7  HCT     33.0    Discharge Exam: General appearance: alert, cooperative and no distress Extremities: Homans sign is negative, no sign of DVT, no edema, redness or tenderness in the calves or thighs and no ulcers, gangrene or trophic changes  Disposition: Home with follow up in 2 weeks   Follow-up Information    Follow up with Mauri Pole, MD. Schedule an appointment as soon as possible for a visit in 2 weeks.   Specialty:  Orthopedic Surgery   Contact information:   [address] [place] [address] [phone]       Follow up with [facility].   Why:  home  health physical therapy   Contact information:   [place] [address] [phone]       Follow up with [facility].   Why:  3n1 and rolling walker   Contact information:   [address] [place] [address] [phone]       Discharge Instructions    Call MD / Call 911    Complete by:  As directed   If you experience chest pain or shortness of breath, CALL 911 and be transported to the hospital emergency room.  If you develope a fever above 101 F, pus (white drainage) or increased drainage  or redness at the wound, or calf pain, call your surgeon's office.     Change dressing    Complete by:  As directed   Maintain surgical dressing until follow up in the clinic. If the edges start to pull up, may reinforce with tape. If the dressing is no longer working, may remove and cover with gauze and tape, but must keep the area dry and clean.  Call with any questions or concerns.     Constipation Prevention    Complete by:  As directed   Drink plenty of fluids.  Prune juice may be helpful.  You may use a stool softener, such as Colace (over the counter) 100 mg twice a day.  Use MiraLax (over the counter) for constipation as needed.     Diet - low sodium heart healthy    Complete by:  As directed      Discharge instructions    Complete by:  As directed   Maintain surgical dressing until follow up in the clinic. If the edges start to pull up, may reinforce with tape. If the dressing is no longer working, may remove and cover with gauze and tape, but must keep the area dry and clean.  Follow up in 2 weeks at [facility] Call with any questions or concerns.     Increase activity slowly as tolerated    Complete by:  As directed   Weight bearing as tolerated with assist device (walker, cane, etc) as directed, use it as long as suggested by your surgeon or therapist, typically at least 4-6 weeks.     TED hose    Complete by:  As directed   Use stockings (TED hose) for 2 weeks on both leg(s).  You may remove them at night for sleeping.             Medication List    STOP taking these medications        aspirin 325 MG tablet     mesalamine 1.2 g EC tablet  Commonly known as:  LIALDA      TAKE these medications        acetaminophen 500 MG tablet  Commonly known as:  TYLENOL  Take 2 tablets (1,000 mg total) by mouth every 8 (eight) hours.     AMBULATORY NON FORMULARY MEDICATION  Panothetic Acid B5 Take 1 tablet by mouth once daily     Ascorbic Acid 500 MG Chew  Chew  500 mg by mouth daily.     CALCIUM PO  Take 1 tablet by mouth daily.     docusate sodium 100 MG capsule  Commonly known as:  COLACE  Take 1 capsule (100 mg total) by mouth 2 (two) times daily.     ferrous sulfate 325 (65 FE) MG tablet  Take 1  tablet (325 mg total) by mouth 3 (three) times daily after meals.     methocarbamol 500 MG tablet  Commonly known as:  ROBAXIN  Take 1 tablet (500 mg total) by mouth every 6 (six) hours as needed for muscle spasms.     metoprolol succinate 50 MG 24 hr tablet  Commonly known as:  TOPROL-XL  Take 50 mg by mouth daily. Take with or immediately following a meal.     metoprolol succinate 100 MG 24 hr tablet  Commonly known as:  TOPROL-XL  Take 100 mg by mouth daily. Take with or immediately following a meal.     OSTEO BI-FLEX TRIPLE STRENGTH Tabs  Take 1 tablet by mouth daily.     polyethylene glycol packet  Commonly known as:  MIRALAX / GLYCOLAX  Take 17 g by mouth 2 (two) times daily.     POTASSIUM PO  Take 100 mg by mouth daily. OTC potassium     rivaroxaban 10 MG Tabs tablet  Commonly known as:  XARELTO  Take 1 tablet (10 mg total) by mouth daily.     simvastatin 20 MG tablet  Commonly known as:  ZOCOR  Take 20 mg by mouth daily.     thiamine 100 MG tablet  Commonly known as:  VITAMIN B-1  Take 100 mg by mouth daily.     traMADol 50 MG tablet  Commonly known as:  ULTRAM  Take 1-2 tablets (50-100 mg total) by mouth every 6 (six) hours as needed.     VITAMIN B-12 PO  Take 1 tablet by mouth daily.     VITAMIN B-6 PO  Take 1 tablet by mouth daily.     VITAMIN D PO  Take 1 tablet by mouth daily.     vitamin E 100 UNIT capsule  Take by mouth daily.         Signed: [place]. Winnell Bento   PA-C  01/04/2016, 1:13 PM

## 2016-01-21 ENCOUNTER — Ambulatory Visit (INDEPENDENT_AMBULATORY_CARE_PROVIDER_SITE_OTHER): Payer: No Typology Code available for payment source | Admitting: Gastroenterology

## 2016-01-21 ENCOUNTER — Encounter: Payer: Self-pay | Admitting: Gastroenterology

## 2016-01-21 VITALS — BP 120/68 | HR 72 | Ht 62.5 in | Wt 159.4 lb

## 2016-01-21 DIAGNOSIS — K5150 Left sided colitis without complications: Secondary | ICD-10-CM

## 2016-01-21 DIAGNOSIS — R102 Pelvic and perineal pain: Secondary | ICD-10-CM | POA: Diagnosis not present

## 2016-01-21 NOTE — Patient Instructions (Signed)
Follow up in 6- 12 months. [phone]  Continue Lialda as directed.  If you are age 65 or older, your body mass index should be between 23-30. Your Body mass index is 28.67 kg/(m^2). If this is out of the aforementioned range listed, please consider follow up with your Primary Care Provider.  If you are age 36 or younger, your body mass index should be between 19-25. Your Body mass index is 28.67 kg/(m^2). If this is out of the aformentioned range listed, please consider follow up with your Primary Care Provider.   Thank you for choosing [facility]  Dr Chauncey Cruel. [place]

## 2016-01-21 NOTE — Progress Notes (Signed)
HPI :  INTAKE HISTORY 65 y/o female here for follow up. Former patient of Dr. Olevia Perches, new patient to me. She has a history of left sided UC diagnosed on colonoscopy December 2015 as outlined below. She was initially treated with Lialda 2.4gm daily as well as Hydrocortizone enemas which helped her symptoms but has not resolved it.   Patient she is doing okay but has some chronic active symptoms. She is having 5-6 BMs in the morning, and then nothing the rest of the day. She thinks she has had this for the past year and a half. She has some increased urgency to use the bathroom. She denies blood in the stools. Stools are formed mostly. She does have some abdominal cramps which can come and go. She reports has been off the hydrocortizone enemas since one month after the diagnosis. She is taking 2.4 gm Lialda daily and that's it for her colitis.   She takes arthritis and has been taking Aleve for this. She takes Aleve once every few days or so. She is using topical creams as well for her arthritis. She otherwise takes an aspirin 363m daily for history of CVA, which occurred 7 years ago. She thnks she is noncompliant with with Lialda and doesn't take it every day. .   Colonoscopy December 2015 - left sided colitis, path c/w chronic active colitis  SINCE LAST VISIT:  She was told to take her Lialda daily and add Rowasa enemas PRN since the last visit. She had some compliance issues and had ran out of the medication which led to a flare in April but has since resumed it.  She is having on average 3 BMs per day right now, sometimes less, much better than before taking Lialda. She is taking Lialda 2 tabs per day. Stool is formed. No blood in the stools. Weight is stable she thinks. She had a knee surgery which went well, done about 3 weeks ago.  She has had some suprapubic pain which has bothered her periodically. She reports occurs anywheren from once to a few times per week. She uses a heat pad and it  will resolve. Not related to bowel movements. Bowel movements do not relieve her pain. Pain not related to urination. Symptoms ongoing since September. Pain is about the same, no interval worsening. Pain is a cramping sensation. She reports a negative pelvic [place] primary care for this issue. She is taking aspirin 3267mdaily for history of CVA, she is not taking any Aleve. She has not needed to use the enemas, which have provided benefit in the past which she has taken them.     Past Medical History  Diagnosis Date  . Hypertension   . Weakness of right hand     due to stroke  . Hyperlipidemia   . Basal cell carcinoma     on front chest  . GERD (gastroesophageal reflux disease)   . Post-operative nausea and vomiting   . Transient global amnesia 2009  . Blood in stool     LAST 2 MONTHS, MEDS CHNAGED FOR  . Laryngitis     started monday night  . [place]ative colitis ([place]  . [place] [place]  . Apnea, sleep     no c-pap machine  . Arthritis     OA  . Neuromuscular disorder ([place]  . Synovitis of right hand 12/29/15    and wrist     Past Surgical History  Procedure Laterality Date  .  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 Total knee arthroplasty Left 2001  . Shoulder arthroscopy w/ rotator cuff repair Right 2010  . Colonscopy    . Total knee arthroplasty Right 12/31/2015    Procedure: RIGHT TOTAL KNEE ARTHROPLASTY;  Surgeon: Paralee Cancel, MD;  Location: [facility];  Service: Orthopedics;  Laterality: Right;   Family History  Problem Relation Age of Onset  . Colon cancer Neg Hx   . Esophageal cancer Neg Hx   . Rectal cancer Neg Hx   . Stomach cancer Neg Hx   . Heart disease Paternal Grandmother   . Diabetes Paternal Grandmother   . Diabetes Father   . Mental illness Father   . Narcolepsy Father   . Heart attack Maternal Grandfather   . Stroke Maternal Grandmother    Social History  Substance Use Topics  . Smoking status: Former  Smoker    Types: Cigarettes    Quit date: 06/20/1989  . Smokeless tobacco: Never Used     Comment: smoked occasional cigarettes for 5 years  . Alcohol Use: No   Current Outpatient Prescriptions  Medication Sig Dispense Refill  . acetaminophen (TYLENOL) 500 MG tablet Take 2 tablets (1,000 mg total) by mouth every 8 (eight) hours. 30 tablet 0  . AMBULATORY NON FORMULARY MEDICATION Panothetic Acid B5 Take 1 tablet by mouth once daily    . Ascorbic Acid 500 MG CHEW Chew 500 mg by mouth daily.    Marland Kitchen CALCIUM PO Take 1 tablet by mouth daily.    . Cholecalciferol (VITAMIN D PO) Take 1 tablet by mouth daily.    . Cyanocobalamin (VITAMIN B-12 PO) Take 1 tablet by mouth daily.    . ferrous sulfate 325 (65 FE) MG tablet Take 1 tablet (325 mg total) by mouth 3 (three) times daily after meals.  3  . LIALDA 1.2 g EC tablet TAKE 4 TABLETS (4.8 G TOTAL) BY MOUTH DAILY WITH BREAKFAST.  1  . metoprolol succinate (TOPROL-XL) 50 MG 24 hr tablet Take 50 mg by mouth daily. Take with or immediately following a meal.    . Misc Natural Products (OSTEO BI-FLEX TRIPLE STRENGTH) TABS Take 1 tablet by mouth daily.    Marland Kitchen POTASSIUM PO Take 100 mg by mouth daily. OTC potassium    . Pyridoxine HCl (VITAMIN B-6 PO) Take 1 tablet by mouth daily.    . simvastatin (ZOCOR) 20 MG tablet Take 20 mg by mouth daily.    Marland Kitchen thiamine (VITAMIN B-1) 100 MG tablet Take 100 mg by mouth daily.    . traMADol (ULTRAM) 50 MG tablet Take 1-2 tablets (50-100 mg total) by mouth every 6 (six) hours as needed. 80 tablet 0  . vitamin E 100 UNIT capsule Take by mouth daily.     No current facility-administered medications for this visit.   Allergies  Allergen Reactions  . Codeine Nausea And Vomiting  . Morphine And Related     Causes hallucinations      Review of Systems: All systems reviewed and negative except where noted in HPI.   Lab Results  Component Value Date   CREATININE 0.39* 01/01/2016   BUN 8 01/01/2016   NA 138 01/01/2016     K 3.8 01/01/2016   CL 110 01/01/2016   CO2 22 01/01/2016     Physical Exam: BP 120/68 mmHg  Pulse 72  Ht 5' 2.5" (1.588 m)  Wt 159 lb 6.4 oz (72.303 kg)  BMI 28.67 kg/m2 Constitutional: Pleasant,well-developed, female in no acute distress. HEENT: Normocephalic and atraumatic. Conjunctivae are normal. No scleral icterus. Neck supple.  Cardiovascular: Normal rate, regular rhythm.  Pulmonary/chest: Effort normal and breath sounds normal. No wheezing, rales or rhonchi. Abdominal: Soft, nondistended, nontender. Negative [place], could not reproduce pain. Bowel sounds active throughout. There are no masses palpable. No hepatomegaly. Extremities: no edema Lymphadenopathy: No cervical adenopathy noted. Neurological: Alert and oriented to person place and time. Skin: Skin is warm and dry. No rashes noted. Psychiatric: Normal mood and affect. Behavior is normal.   ASSESSMENT AND PLAN: 65 y/o female here to follow up for issues as outlined:  Left sided UC - good compliance with Lialda since April and symptoms very well controlled at this time. I discussed importance of compliance with Lialda and that she has flared when stopping / running out of this in the past. She should continue the lowest dose needed to control her symptoms and tolerating 2.4gm daily. She has a relatively recent diagnosis, do not need surveillance colonoscopy at this time as long as she is feeling well. Continue Lialda daily and Rowasa PRN. Creatinine stable, follow up in 6-12 months.   Pelvic pain - does not seem to be abdominal pain given location, pain along pubic bone. I don't think related to her colitis, has no relationship with bowel movements. Prior pelvic US reportedly negative. Symptoms mild and intermittent and easily relieved with heating pad. Could be musculoskeletal in etiology. Given this continues to bother her I offered her cross sectional imaging with CT to further evaluate and to provide reassurance,  however after discussion of this she preferred to hold off on further imaging and will monitor for now. She can contact me in the interim if she changes her mind.   [place] Cellar, MD [facility] Pager [phone]

## 2016-03-30 ENCOUNTER — Other Ambulatory Visit: Payer: Self-pay | Admitting: Gastroenterology

## 2016-04-14 ENCOUNTER — Other Ambulatory Visit: Payer: Self-pay | Admitting: Family Medicine

## 2016-04-14 ENCOUNTER — Ambulatory Visit
Admission: RE | Admit: 2016-04-14 | Discharge: 2016-04-14 | Disposition: A | Discharge status: Home / Self Care | Payer: Medicare Other | Source: Ambulatory Visit | Attending: Family Medicine | Admitting: Family Medicine

## 2016-04-14 DIAGNOSIS — R0789 Other chest pain: Secondary | ICD-10-CM

## 2016-06-07 ENCOUNTER — Other Ambulatory Visit: Payer: Self-pay | Admitting: Gastroenterology

## 2016-06-23 ENCOUNTER — Other Ambulatory Visit: Payer: Self-pay | Admitting: Obstetrics & Gynecology

## 2016-06-24 LAB — CYTOLOGY - PAP

## 2016-10-31 ENCOUNTER — Other Ambulatory Visit: Payer: Self-pay | Admitting: Gastroenterology

## 2017-06-17 ENCOUNTER — Other Ambulatory Visit: Payer: Self-pay

## 2017-06-17 MED ORDER — MESALAMINE 1.2 G PO TBEC: 4.8000 g | DELAYED_RELEASE_TABLET | Freq: Every day | ORAL | 0 refills | Status: DC

## 2017-06-17 MED ORDER — ACETAMINOPHEN 500 MG PO TABS: 1000.0000 mg | ORAL_TABLET | Freq: Three times a day (TID) | ORAL | 0 refills | Status: DC

## 2017-06-17 NOTE — Telephone Encounter (Signed)
Rec'd refill request for 90 day supply of Lialda. Pt last seen 01-2016.  Called and spoke to pt and scheduled a F/U appt with Dr. Havery Moros for January 2019.  Sent prescription to [facility] in [place] for 30 day supply.  Pt expressed understanding that she would need to keep January appt for future refills.

## 2017-07-19 ENCOUNTER — Other Ambulatory Visit: Payer: Self-pay | Admitting: Gastroenterology

## 2017-08-05 ENCOUNTER — Encounter: Payer: Self-pay | Admitting: Gastroenterology

## 2017-08-05 ENCOUNTER — Other Ambulatory Visit (INDEPENDENT_AMBULATORY_CARE_PROVIDER_SITE_OTHER): Payer: Medicare Other

## 2017-08-05 ENCOUNTER — Ambulatory Visit: Payer: Medicare Other | Admitting: Gastroenterology

## 2017-08-05 ENCOUNTER — Encounter (INDEPENDENT_AMBULATORY_CARE_PROVIDER_SITE_OTHER): Payer: Self-pay

## 2017-08-05 VITALS — BP 122/70 | HR 74 | Ht 62.5 in | Wt 161.4 lb

## 2017-08-05 DIAGNOSIS — K51519 Left sided colitis with unspecified complications: Secondary | ICD-10-CM | POA: Diagnosis not present

## 2017-08-05 DIAGNOSIS — K5150 Left sided colitis without complications: Secondary | ICD-10-CM | POA: Diagnosis not present

## 2017-08-05 LAB — BASIC METABOLIC PANEL
BUN: 12 mg/dL (ref 6–23)
CALCIUM: 10.1 mg/dL (ref 8.4–10.5)
CO2: 29 mEq/L (ref 19–32)
CREATININE: 0.65 mg/dL (ref 0.40–1.20)
Chloride: 103 mEq/L (ref 96–112)
GFR: 96.75 mL/min (ref >60.00)
Glucose, Bld: 110 mg/dL — ABNORMAL HIGH (ref 70–99)
POTASSIUM: 4.3 meq/L (ref 3.5–5.1)
Sodium: 140 mEq/L (ref 135–145)

## 2017-08-05 LAB — CBC WITH DIFFERENTIAL/PLATELET
BASOS ABS: 0.3 10*3/uL — AB (ref 0.0–0.1)
Basophils Relative: 3 % (ref 0.0–3.0)
EOS ABS: 0.6 10*3/uL (ref 0.0–0.7)
EOS PCT: 6.4 % — AB (ref 0.0–5.0)
HCT: 41.6 % (ref 36.0–46.0)
HEMOGLOBIN: 15 g/dL (ref 12.0–15.0)
LYMPHS ABS: 3.5 10*3/uL (ref 0.7–4.0)
Lymphocytes Relative: 39 % (ref 12.0–46.0)
MCHC: 36 g/dL (ref 30.0–36.0)
MCV: 91.7 fl (ref 78.0–100.0)
MONO ABS: 1.1 10*3/uL — AB (ref 0.1–1.0)
Monocytes Relative: 11.8 % (ref 3.0–12.0)
NEUTROS PCT: 39.8 % — AB (ref 43.0–77.0)
Neutro Abs: 3.6 10*3/uL (ref 1.4–7.7)
Platelets: 352 10*3/uL (ref 150.0–400.0)
RBC: 4.53 Mil/uL (ref 3.87–5.11)
RDW: 13.8 % (ref 11.5–15.5)
WBC: 9 10*3/uL (ref 4.0–10.5)

## 2017-08-05 LAB — VITAMIN D 25 HYDROXY (VIT D DEFICIENCY, FRACTURES): VITD: 40.32 ng/mL (ref 30.00–100.00)

## 2017-08-05 MED ORDER — NA SULFATE-K SULFATE-MG SULF 17.5-3.13-1.6 GM/177ML PO SOLN: 1.0000 | Freq: Once | ORAL | 0 refills | Status: AC

## 2017-08-05 MED ORDER — MESALAMINE 1.2 G PO TBEC: 4.8000 g | DELAYED_RELEASE_TABLET | Freq: Every day | ORAL | 1 refills | Status: DC

## 2017-08-05 NOTE — Progress Notes (Signed)
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 Olevia Perches. She was initially treated with Lialda 2.4gm daily as well as Hydrocortizone enemas which helped her symptoms. She has been maintained with Lialda since that time.  We added Rowasa enemas to use as needed at one point time.  SINCE LAST VISIT  Patient is here for follow-up visit.  She states she is pretty stable since her last visit which was about a year and a half ago.  She reports anywhere from 3-8 bowel movements per day which is her typical baseline.  This appears increased in frequency since the last time I have seen her, previously she was around 3 BMs per day at most. She is not taking any Rowasa.  She reports prior to her diagnosis of colitis her bowel movements were usually once or twice per day and formed.  She states she passes low volume stools without any urgency but does have some sense of incomplete evacuation.  She usually has a bowel movement after eating.  She does have some occasional blood in her stools at times.  She has some low back pain for which she takes Advil usually at least 2/day, as well as Tylenol as needed.  She has been taking to Lialda as per day which generally has worked for her in the past.  She denies any abdominal pain.  She denies any weight loss.  Otherwise she feels well without any complaints.  Colonoscopy December 2015 - left sided colitis, path c/w chronic active colitis   Past Medical History:  Diagnosis Date  . Apnea, sleep    no c-pap machine  . Arthritis    OA  . Basal cell carcinoma    on front chest  . Blood in stool    LAST 2 MONTHS, MEDS CHNAGED FOR  . GERD (gastroesophageal reflux disease)   . Hyperlipidemia   . Hypertension   . Laryngitis    started monday night  . Neuromuscular disorder ([place])   . Post-operative nausea and vomiting   . Stroke ([place]) 09/21/2007  . Synovitis of right hand 12/29/15   and wrist  . Transient global amnesia 2009  .  Ulcerative colitis ([place])   . Weakness of right hand    due to stroke     Past Surgical History:  Procedure Laterality Date  . COLONSCOPY    . SHOULDER ARTHROSCOPY W/ ROTATOR CUFF REPAIR Right 2010  . SPLENECTOMY, 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 at 44 yers old  . TOTAL KNEE ARTHROPLASTY Left 2001  . TOTAL KNEE ARTHROPLASTY Right 12/31/2015   Procedure: RIGHT TOTAL KNEE ARTHROPLASTY;  Surgeon: Paralee Cancel, MD;  Location: [facility];  Service: Orthopedics;  Laterality: Right;   Family History  Problem Relation Age of Onset  . Diabetes Father   . Mental illness Father   . Narcolepsy Father   . Heart disease Paternal Grandmother   . Diabetes Paternal Grandmother   . Heart attack Maternal Grandfather   . Stroke Maternal Grandmother   . Colon cancer Neg Hx   . Esophageal cancer Neg Hx   . Rectal cancer Neg Hx   . Stomach cancer Neg Hx    Social History   Tobacco Use  . Smoking status: Former Smoker    Types: Cigarettes    Last attempt to quit: 06/20/1989    Years since quitting: 28.1  . Smokeless tobacco: Never Used  . Tobacco comment: smoked occasional cigarettes for  5 years  Substance Use Topics  . Alcohol use: No    Alcohol/week: 0.0 oz  . Drug use: No   Current Outpatient Medications  Medication Sig Dispense Refill  . alendronate (FOSAMAX) 70 MG tablet Take 70 mg by mouth once a week. Take with a full glass of water on an empty stomach.    . AMBULATORY NON FORMULARY MEDICATION Panothetic Acid B5 Take 1 tablet by mouth once daily    . Ascorbic Acid 500 MG CHEW Chew 500 mg by mouth daily.    Marland Kitchen CALCIUM PO Take 1 tablet by mouth daily.    . Cholecalciferol (VITAMIN D PO) Take 1 tablet by mouth daily.    . Cyanocobalamin (VITAMIN B-12 PO) Take 1 tablet by mouth daily.    . ferrous sulfate 325 (65 FE) MG tablet Take 1 tablet (325 mg total) by mouth 3 (three) times daily after meals.  3  . mesalamine (LIALDA) 1.2 g EC tablet TAKE 4 TABLETS (4.8 G TOTAL) BY MOUTH DAILY WITH  BREAKFAST. 120 tablet 0  . metoprolol succinate (TOPROL-XL) 50 MG 24 hr tablet Take 50 mg by mouth daily. Take with or immediately following a meal.    . Misc Natural Products (OSTEO BI-FLEX TRIPLE STRENGTH) TABS Take 1 tablet by mouth daily.    Marland Kitchen POTASSIUM PO Take 100 mg by mouth daily. OTC potassium    . Pyridoxine HCl (VITAMIN B-6 PO) Take 1 tablet by mouth daily.    . simvastatin (ZOCOR) 20 MG tablet Take 20 mg by mouth daily.    Marland Kitchen thiamine (VITAMIN B-1) 100 MG tablet Take 100 mg by mouth daily.    . traMADol (ULTRAM) 50 MG tablet Take 1-2 tablets (50-100 mg total) by mouth every 6 (six) hours as needed. 80 tablet 0  . vitamin E 100 UNIT capsule Take by mouth daily.     No current facility-administered medications for this visit.    Allergies  Allergen Reactions  . Codeine Nausea And Vomiting  . Morphine And Related     Causes hallucinations      Review of Systems: All systems reviewed and negative except where noted in HPI.   Lab Results  Component Value Date   WBC 17.5 (H) 01/01/2016   HGB 11.7 (L) 01/01/2016   HCT 33.0 (L) 01/01/2016   MCV 91.4 01/01/2016   PLT 319 01/01/2016    Lab Results  Component Value Date   CREATININE 0.39 (L) 01/01/2016   BUN 8 01/01/2016   NA 138 01/01/2016   K 3.8 01/01/2016   CL 110 01/01/2016   CO2 22 01/01/2016    Lab Results  Component Value Date   ALT 15 10/22/2015   AST 21 10/22/2015   GGT 19 04/04/2015   ALKPHOS 72 10/22/2015   BILITOT 0.9 10/22/2015    Physical Exam: BP 122/70   Pulse 74   Ht 5' 2.5" (1.588 m)   Wt 161 lb 6 oz (73.2 kg)   BMI 29.05 kg/m  Constitutional: Pleasant,well-developed, female in no acute distress. HEENT: Normocephalic and atraumatic. Conjunctivae are normal. No scleral icterus. Neck supple.  Cardiovascular: Normal rate, regular rhythm.  Pulmonary/chest: Effort normal and breath sounds normal. No wheezing, rales or rhonchi. Abdominal: Soft, nondistended, nontender. There are no masses  palpable. No hepatomegaly. Extremities: no edema Lymphadenopathy: No cervical adenopathy noted. Neurological: Alert and oriented to person place and time. Skin: Skin is warm and dry. No rashes noted. Psychiatric: Normal mood and affect. Behavior is normal.  ASSESSMENT AND PLAN:  67 year old female here for reassessment following issues:  Left-sided colitis -generally had been pretty well controlled with Lialda monotherapy since diagnosis.  She states she feels well however she is having frequency upwards of 3-8 times per day along with occasional rectal bleeding since I last seen her.  I suspect she likely has some active colitis.  We discussed options at this point.  We will check basic CBC and renal function to ensure stable since it has been a while, and we will also check her vitamin D level and replete if needed.  I will increase her Lialda to 4.8 g/day to see if this will help her feel better, and also recommend she resume her Rowasa enemas and use every night for a week to see if this helps.  If this works for her she can use Rowasa as needed moving forward.  Otherwise after she has been on this regimen for 1-2 months or so I recommend a surveillance colonoscopy to assess for mucosal healing/response to maximal mesalamine therapy.  If she has significantly active inflammation despite max dose mesalamine we may need to consider other options and escalate her therapy.  Otherwise I counseled her on NSAIDs, this could be flaring her colitis.  Recommend she completely abstain from NSAIDs and can use low-dose Tylenol as needed for pain.  Following discussion of risks and benefits of colonoscopy she want to proceed as outlined.  Further recommendations pending her course and results of this exam.  Newberg Cellar, MD [facility]astroenter[facility] Pager [phone]

## 2017-08-05 NOTE — Patient Instructions (Signed)
If you are age 66 or older, your body mass index should be between 23-30. Your Body mass index is 29.05 kg/m. If this is out of the aforementioned range listed, please consider follow up with your Primary Care Provider.  If you are age 52 or younger, your body mass index should be between 19-25. Your Body mass index is 29.05 kg/m. If this is out of the aformentioned range listed, please consider follow up with your Primary Care Provider.   You have been scheduled for a colonoscopy. Please follow written instructions given to you at your visit today.  Please pick up your prep supplies at the pharmacy within the next 1-3 days. If you use inhalers (even only as needed), please bring them with you on the day of your procedure. Your physician has requested that you go to www.startemmi.com and enter the access code given to you at your visit today. This web site gives a general overview about your procedure. However, you should still follow specific instructions given to you by our office regarding your preparation for the procedure.  Please go to the lab in the basement of our building to have lab work done as you leave today.  We have sent the following medications to your pharmacy for you to pick up at your convenience: Lialda   Resume your Rowasa enemas.   Please discontinue using all NSAIDS.  Thank you for entrusting me with your care and for choosing [facility], Dr. Santee Cellar

## 2017-08-06 ENCOUNTER — Encounter: Payer: Self-pay | Admitting: Gastroenterology

## 2017-08-27 ENCOUNTER — Encounter: Payer: Self-pay | Admitting: Gastroenterology

## 2017-09-10 ENCOUNTER — Other Ambulatory Visit: Payer: Self-pay

## 2017-09-10 ENCOUNTER — Encounter: Payer: Self-pay | Admitting: Gastroenterology

## 2017-09-10 ENCOUNTER — Ambulatory Visit (AMBULATORY_SURGERY_CENTER): Payer: Medicare Other | Admitting: Gastroenterology

## 2017-09-10 VITALS — BP 115/51 | HR 70 | Temp 98.4°F | Resp 14 | Ht 62.5 in | Wt 161.0 lb

## 2017-09-10 DIAGNOSIS — K5150 Left sided colitis without complications: Secondary | ICD-10-CM | POA: Diagnosis not present

## 2017-09-10 DIAGNOSIS — D123 Benign neoplasm of transverse colon: Secondary | ICD-10-CM

## 2017-09-10 DIAGNOSIS — Z1211 Encounter for screening for malignant neoplasm of colon: Secondary | ICD-10-CM

## 2017-09-10 MED ORDER — SODIUM CHLORIDE 0.9 % IV SOLN: 500.0000 mL | Freq: Once | INTRAVENOUS | Status: DC

## 2017-09-10 NOTE — Progress Notes (Signed)
Report given to PACU, vss 

## 2017-09-10 NOTE — Progress Notes (Signed)
Called to room to assist during endoscopic procedure.  Patient ID and intended procedure confirmed with present staff. Received instructions for my participation in the procedure from the performing physician.  

## 2017-09-10 NOTE — Op Note (Signed)
[place] Patient Name: Hannah Willis Procedure Date: 09/10/2017 11:34 AM MRN: [id number] Endoscopist: Remo Lipps P. 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 The patient's tolerance of                            previous anesthesia was also reviewed. The risks                            and benefits of the procedure and the sedation                            options and risks were discussed with the patient.                            All questions were answered, and informed consent                            was obtained. Prior Anticoagulants: The patient has                            taken no previous anticoagulant or antiplatelet                            agents. ASA Grade Assessment: III - A patient with                            severe systemic disease. After reviewing the risks                            and benefits, the patient was deemed in  satisfactory condition to undergo the procedure.                           After obtaining informed consent, the colonoscope                            was passed under direct vision. Throughout the                            procedure, the patient's blood pressure, pulse, and                            oxygen saturations were monitored continuously. The                            Model PCF-H190DL  [id number]) scope was introduced                            through the anus and advanced to the the terminal                            ileum, with identification of the appendiceal                            orifice and IC valve. The colonoscopy was performed                            without difficulty. The patient tolerated the                            procedure well. The quality of the bowel                            preparation was fair. The terminal ileum, ileocecal                            valve, appendiceal orifice, and rectum were                            photographed. Scope In: 11:43:56 AM Scope Out: 81:19:14 PM Scope Withdrawal Time: 0 hours 18 minutes 59 seconds  Total Procedure Duration: 0 hours 22 minutes 18 seconds  Findings:                 The perianal and digital rectal examinations were                            normal.                           The terminal ileum appeared normal.                           A 4 mm polyp was found in the transverse colon. The  polyp was sessile. The polyp was removed with a                            cold snare. Resection and retrieval were complete.                           Multiple medium-mouthed diverticula were found in                            the sigmoid colon.                           The prep was fair. Cecal cap / AO inable to be                            visualized due to residual stool. Time was spent to                            lavage the entire colon to achieve fair views in                            most areas. The exam was otherwise without                            abnormality on direct and retroflexion views. No                            active inflammation appreciated in the left colon.                           Biopsies were taken with a cold forceps from the                            descending colon, sigmoid colon and rectum. These                            biopsy  specimens were sent to Pathology. Complications:            No immediate complications. Estimated blood loss:                            Minimal. Estimated Blood Loss:     Estimated blood loss was minimal. Impression:               - Preparation of the colon was fair, could not                            clear the cecal cap.                           - The examined portion of the ileum was normal.                           - One 4 mm polyp in the  transverse colon, removed                            with a cold snare. Resected and retrieved.                           - Diverticulosis in the sigmoid colon.                           - The examination was otherwise normal on direct                            and retroflexion views. No active inflammation.                           - Biopsies for surveillance were taken from the                            descending colon, sigmoid colon and rectum. Recommendation:           - Patient has a contact number available for                            emergencies. The signs and symptoms of potential                            delayed complications were discussed with the                            patient. Return to normal activities tomorrow.                            Written discharge instructions were provided to the                            patient.                           - Resume previous diet.                           - Continue present medications.                           - Await pathology results. Remo Lipps P. Samy Ryner MD, MD 09/10/2017 12:12:51 PM This report has been signed electronically.

## 2017-09-10 NOTE — Patient Instructions (Signed)
YOU HAD AN ENDOSCOPIC PROCEDURE TODAY AT [facility]:   Refer to the procedure report that was given to you for any specific questions about what was found during the examination.  If the procedure report does not answer your questions, please call your gastroenterologist to clarify.  If you requested that your care partner not be given the details of your procedure findings, then the procedure report has been included in a sealed envelope for you to review at your convenience later.  YOU SHOULD EXPECT: Some feelings of bloating in the abdomen. Passage of more gas than usual.  Walking can help get rid of the air that was put into your GI tract during the procedure and reduce the bloating. If you had a lower endoscopy (such as a colonoscopy or flexible sigmoidoscopy) you may notice spotting of blood in your stool or on the toilet paper. If you underwent a bowel prep for your procedure, you may not have a normal bowel movement for a few days.  Please Note:  You might notice some irritation and congestion in your nose or some drainage.  This is from the oxygen used during your procedure.  There is no need for concern and it should clear up in a day or so.  SYMPTOMS TO REPORT IMMEDIATELY:   Following lower endoscopy (colonoscopy or flexible sigmoidoscopy):  Excessive amounts of blood in the stool  Significant tenderness or worsening of abdominal pains  Swelling of the abdomen that is new, acute  Fever of 100F or higher   For urgent or emergent issues, a gastroenterologist can be reached at any hour by calling [phone].   DIET:  We do recommend a small meal at first, but then you may proceed to your regular diet.  Drink plenty of fluids but you should avoid alcoholic beverages for 24 hours. You may want to increase the fiber in your diet, and drink plenty of water.  ACTIVITY:  You should plan to take it easy for the rest of today and you should NOT DRIVE or use heavy machinery  until tomorrow (because of the sedation medicines used during the test).    FOLLOW UP: Our staff will call the number listed on your records the next business day following your procedure to check on you and address any questions or concerns that you may have regarding the information given to you following your procedure. If we do not reach you, we will leave a message.  However, if you are feeling well and you are not experiencing any problems, there is no need to return our call.  We will assume that you have returned to your regular daily activities without incident.  If any biopsies were taken you will be contacted by phone or by letter within the next 1-3 weeks.  Please call us at [phone] if you have not heard about the biopsies in 3 weeks.    SIGNATURES/CONFIDENTIALITY: You and/or your care partner have signed paperwork which will be entered into your electronic medical record.  These signatures attest to the fact that that the information above on your After Visit Summary has been reviewed and is understood.  Full responsibility of the confidentiality of this discharge information lies with you and/or your care-partner.  Read all of the handouts given to you by your recovery room nurse.

## 2017-09-11 ENCOUNTER — Telehealth: Payer: Self-pay | Admitting: *Deleted

## 2017-09-11 NOTE — Telephone Encounter (Signed)
  Follow up Call-  Call back number 09/10/2017  Post procedure Call Back phone  # [phone]  Permission to leave phone message Yes  Some recent data might be hidden     Patient questions:  Do you have a fever, pain , or abdominal swelling? No. Pain Score  0 *  Have you tolerated food without any problems? Yes.    Have you been able to return to your normal activities? Yes.    Do you have any questions about your discharge instructions: Diet   No. Medications  No. Follow up visit  No.  Do you have questions or concerns about your Care? No.  Actions: * If pain score is 4 or above: No action needed, pain <4.

## 2017-09-16 ENCOUNTER — Encounter: Payer: Self-pay | Admitting: Gastroenterology

## 2018-02-06 ENCOUNTER — Other Ambulatory Visit: Payer: Self-pay | Admitting: Gastroenterology

## 2018-08-24 ENCOUNTER — Other Ambulatory Visit: Payer: Self-pay | Admitting: Family Medicine

## 2018-08-24 DIAGNOSIS — E2839 Other primary ovarian failure: Secondary | ICD-10-CM

## 2018-08-24 DIAGNOSIS — M810 Age-related osteoporosis without current pathological fracture: Secondary | ICD-10-CM

## 2018-10-08 ENCOUNTER — Encounter: Payer: Self-pay | Admitting: Gastroenterology

## 2018-10-21 ENCOUNTER — Other Ambulatory Visit: Payer: Medicare Other

## 2018-10-25 ENCOUNTER — Other Ambulatory Visit: Payer: Self-pay | Admitting: Gastroenterology

## 2018-10-25 ENCOUNTER — Other Ambulatory Visit: Payer: Self-pay

## 2018-10-25 ENCOUNTER — Ambulatory Visit (INDEPENDENT_AMBULATORY_CARE_PROVIDER_SITE_OTHER): Payer: Medicare Other | Admitting: Gastroenterology

## 2018-10-25 ENCOUNTER — Encounter: Payer: Self-pay | Admitting: Gastroenterology

## 2018-10-25 VITALS — Ht 62.5 in | Wt 155.0 lb

## 2018-10-25 DIAGNOSIS — K5150 Left sided colitis without complications: Secondary | ICD-10-CM | POA: Diagnosis not present

## 2018-10-25 NOTE — Telephone Encounter (Signed)
Dr. Loni Muse - you saw this pt today.  Please see refill request and advise. Thank you

## 2018-10-25 NOTE — Telephone Encounter (Signed)
Yes that's fine, thanks

## 2018-10-25 NOTE — Progress Notes (Signed)
THIS ENCOUNTER IS A VIRTUAL VISIT DUE TO COVID-19 - PATIENT WAS NOT SEEN IN THE OFFICE. PATIENT HAS CONSENTED TO VIRTUAL VISIT / TELEMEDICINE VISIT. VISIT WAS DONE VIA THE PHONE ONLY AS THE PATIENT DOES NOT HAVE A SMART PHONE OR COMPUTER ACCESS FOR VISUAL CAPABILITY, USED ONLY AUDIO   Location of patient: home Location of provider: office Persons participating: myself, patient   HPI :  UC HISTORY Left sided UC diagnosed on colonoscopy December 2015 with Dr. Olevia Perches. She was initially treated with Lialda 2.4gm daily as well as Hydrocortizone enemas which helped her symptoms. She has been maintained with Lialda since that time.  We added Rowasa enemas to use as needed at one point time.  SINCE LAST VISIT  68 y/o female here for a follow up visit. She has been doing pretty well since I have last seen her. She had a colonoscopy done in March 2019, prep was fair, one small adenoma removed, otherwise no significant inflammation appreciated. She has been taking 3 Lialda tabs per day, has not been using Rowasa at all.  She reports in general she has been doing okay and has been stable for the most part. No flares that are bothering her, but she has some persistent mild / scant bleeding with most of her bowel movements. She is having 4 BMs per day or so at baseline. No urgency. Formed stools, no diarrhea. No abdominal pains. She thinks the bleeding has been ongoing since at least her last visit and perhaps longer than a year. She has not had a trial of using Rowasa daily for her symptoms.   Colonoscopy 09/10/2017 - Preparation of the colon was fair, could not clear the cecal cap. - The examined portion of the ileum was normal. - One 4 mm polyp in the transverse colon, removed with a cold snare. Resected and retrieved. - Diverticulosis in the sigmoid colon. - The examination was otherwise normal on direct and retroflexion views. No active inflammation. - Biopsies for surveillance were taken from  the descending colon, sigmoid colon and rectum  Path showed focal active colitis, adenomatous polyp  Colonoscopy December 2015 - left sided colitis, path c/w chronic active colitis   Labs from Dr. Thompson Caul office as outlined:  08/17/2018: WBC 12.1, Hgb 14.7, HCT 41.0, plt 290  Normal renal and kidney function  Past Medical History:  Diagnosis Date  . Apnea, sleep    no c-pap machine  . Arthritis    OA  . Basal cell carcinoma    on front chest  . Blood in stool    LAST 2 MONTHS, MEDS CHNAGED FOR  . GERD (gastroesophageal reflux disease)   . Hyperlipidemia   . Hypertension   . Laryngitis    started monday night  . Neuromuscular disorder ([place])   . Post-operative nausea and vomiting   . Stroke ([place]) 09/21/2007  . Synovitis of right hand 12/29/15   and wrist  . Transient global amnesia 2009  . Ulcerative colitis ([place])   . Weakness of right hand    due to stroke     Past Surgical History:  Procedure Laterality Date  . COLONSCOPY    . SHOULDER ARTHROSCOPY W/ ROTATOR CUFF REPAIR Right 2010  . 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 TOTAL KNEE ARTHROPLASTY Left 2001  . TOTAL KNEE ARTHROPLASTY Right 12/31/2015   Procedure: RIGHT TOTAL KNEE ARTHROPLASTY;  Surgeon: Paralee Cancel, MD;  Location: [facility];  Service:  Orthopedics;  Laterality: Right;   Family History  Problem Relation Age of Onset  . Diabetes Father   . Mental illness Father   . Narcolepsy Father   . Heart disease Paternal Grandmother   . Diabetes Paternal Grandmother   . Heart attack Maternal Grandfather   . Stroke Maternal Grandmother   . Colon cancer Neg Hx   . Esophageal cancer Neg Hx   . Rectal cancer Neg Hx   . Stomach cancer Neg Hx    Social History   Tobacco Use  . Smoking status: Former Smoker    Types: Cigarettes    Last attempt to quit: 06/20/1989    Years since quitting: 29.3  . Smokeless tobacco: Never Used  . Tobacco comment: smoked occasional cigarettes for 5  years  Substance Use Topics  . Alcohol use: No    Alcohol/week: 0.0 standard drinks  . Drug use: No   Current Outpatient Medications  Medication Sig Dispense Refill  . alendronate (FOSAMAX) 70 MG tablet Take 70 mg by mouth once a week. Take with a full glass of water on an empty stomach.    . AMBULATORY NON FORMULARY MEDICATION Panothetic Acid B5 Take 1 tablet by mouth once daily    . Cholecalciferol (VITAMIN D PO) Take 1 tablet by mouth daily.    . Cyanocobalamin (VITAMIN B-12 PO) Take 1 tablet by mouth daily.    . ferrous sulfate 325 (65 FE) MG tablet Take 1 tablet (325 mg total) by mouth 3 (three) times daily after meals. (Patient taking differently: Take 325 mg by mouth daily. )  3  . mesalamine (LIALDA) 1.2 g EC tablet TAKE 4 TABLETS (4.8 G TOTAL) BY MOUTH DAILY WITH BREAKFAST. (Patient taking differently: Take 3.6 g by mouth daily with breakfast. ) 360 tablet 1  . metoprolol succinate (TOPROL-XL) 50 MG 24 hr tablet Take 50 mg by mouth daily. Take with or immediately following a meal.    . Misc Natural Products (OSTEO BI-FLEX TRIPLE STRENGTH) TABS Take 1 tablet by mouth daily.    Marland Kitchen POTASSIUM PO Take 100 mg by mouth daily. OTC potassium    . Pyridoxine HCl (VITAMIN B-6 PO) Take 1 tablet by mouth daily.    . rosuvastatin (CRESTOR) 20 MG tablet Take 20 mg by mouth daily.    Marland Kitchen thiamine (VITAMIN B-1) 100 MG tablet Take 100 mg by mouth daily.    . vitamin E 100 UNIT capsule Take by mouth daily.     No current facility-administered medications for this visit.    Allergies  Allergen Reactions  . Codeine Nausea And Vomiting  . Morphine And Related     Causes hallucinations      Review of Systems: All systems reviewed and negative except where noted in HPI.   Labs as above  Physical Exam: Ht 5' 2.5" (1.588 m)   Wt 155 lb (70.3 kg)   BMI 27.90 kg/m   NA  ASSESSMENT AND PLAN:  68 y/o female here for reassessment of the following:  Left sided UC - generally has been pretty  well controlled with mesalamine over time, last colonoscopy did not show any significant inflammation, although she's had some persistent scant bleeding symptoms over time as well as some increased frequency, but no urgency, passing formed stool, and her Hgb is normal. She has mostly been using Lialda, but discussed with her that Rowasa enemas could provide better control for her symptoms given disease location. She will try the Rowasa every day for  2 weeks to see if it changes anything, she has used it only sparingly since her last visit. If no improvement and bleeding symptoms persist may perform colonoscopy. Prep was only fair on her last exam but no high risk lesions noted - we discussed that regardless of symptoms we may do this by the end of the year for surveillance given her colitis and prior prep, but if bleeding does not resolve, will do it sooner, I asked her to touch base with me in a few weeks for an update. She agreed.  Destin Cellar, MD [facility]

## 2018-12-21 ENCOUNTER — Ambulatory Visit
Admission: RE | Admit: 2018-12-21 | Discharge: 2018-12-21 | Disposition: A | Discharge status: Home / Self Care | Payer: Medicare Other | Source: Ambulatory Visit | Attending: Family Medicine | Admitting: Family Medicine

## 2018-12-21 ENCOUNTER — Other Ambulatory Visit: Payer: Self-pay

## 2018-12-21 DIAGNOSIS — M810 Age-related osteoporosis without current pathological fracture: Secondary | ICD-10-CM

## 2018-12-21 DIAGNOSIS — E2839 Other primary ovarian failure: Secondary | ICD-10-CM

## 2019-04-04 ENCOUNTER — Other Ambulatory Visit: Payer: Self-pay | Admitting: Gastroenterology

## 2019-04-07 ENCOUNTER — Other Ambulatory Visit: Payer: Self-pay | Admitting: Gastroenterology

## 2019-04-07 ENCOUNTER — Telehealth: Payer: Self-pay | Admitting: Gastroenterology

## 2019-06-12 ENCOUNTER — Other Ambulatory Visit: Payer: Self-pay | Admitting: Gastroenterology

## 2019-06-14 ENCOUNTER — Telehealth: Payer: Self-pay | Admitting: Gastroenterology

## 2019-06-14 ENCOUNTER — Other Ambulatory Visit: Payer: Self-pay

## 2019-06-14 MED ORDER — MESALAMINE 1.2 G PO TBEC: 3.6000 g | DELAYED_RELEASE_TABLET | Freq: Every day | ORAL | 2 refills | Status: DC

## 2019-06-14 NOTE — Progress Notes (Signed)
Prescription resent with correct sig: Take 3 tablets (3.6g) once daily with breakfast

## 2019-06-14 NOTE — Telephone Encounter (Signed)
Called and spoke to pt's husband.  She was unavailable. He will let her know that I returned her call.

## 2019-06-15 NOTE — Telephone Encounter (Signed)
Called and spoke to pt.  Let her know her mesalamine was sent to the pharmacy, [facility].  She was appreciative.

## 2019-07-15 ENCOUNTER — Other Ambulatory Visit: Payer: Self-pay | Admitting: Gastroenterology

## 2019-08-28 ENCOUNTER — Ambulatory Visit: Payer: Medicare Other | Attending: Internal Medicine

## 2019-08-28 DIAGNOSIS — Z23 Encounter for immunization: Secondary | ICD-10-CM | POA: Insufficient documentation

## 2019-08-28 NOTE — Progress Notes (Signed)
   Covid-19 Vaccination Clinic  Name:  Ndea Kilroy    MRN: [id number] DOB: 09-19-1950  08/28/2019  Ms. Motton was observed post Covid-19 immunization for 15 minutes without incidence. She was provided with Vaccine Information Sheet and instruction to access the V-Safe system.   Ms. Finder was instructed to call 911 with any severe reactions post vaccine: Marland Kitchen Difficulty breathing  . Swelling of your face and throat  . A fast heartbeat  . A bad rash all over your body  . Dizziness and weakness    Immunizations Administered    Name Date Dose VIS Date Route   Pfizer COVID-19 Vaccine 08/28/2019  8:31 AM 0.3 mL 06/17/2019 Intramuscular   Manufacturer: [place]   Lot: [id number]   [place]: [address]-3

## 2019-09-20 ENCOUNTER — Ambulatory Visit: Payer: Medicare Other | Attending: Internal Medicine

## 2019-09-20 DIAGNOSIS — Z23 Encounter for immunization: Secondary | ICD-10-CM

## 2019-09-20 NOTE — Progress Notes (Signed)
   Covid-19 Vaccination Clinic  Name:  Hannah Willis    MRN: [id number] DOB: 1950-12-30  09/20/2019  Hannah Willis was observed post Covid-19 immunization for 15 minutes without incident. She was provided with Vaccine Information Sheet and instruction to access the V-Safe system.   Hannah Willis was instructed to call 911 with any severe reactions post vaccine: Marland Kitchen Difficulty breathing  . Swelling of face and throat  . A fast heartbeat  . A bad rash all over body  . Dizziness and weakness   Immunizations Administered    Name Date Dose VIS Date Route   Pfizer COVID-19 Vaccine 09/20/2019 12:31 PM 0.3 mL 06/17/2019 Intramuscular   Manufacturer: [place]   Lot: [id number]   [place]: [id number]

## 2019-10-06 ENCOUNTER — Other Ambulatory Visit: Payer: Self-pay | Admitting: Gastroenterology

## 2019-11-03 ENCOUNTER — Other Ambulatory Visit: Payer: Self-pay | Admitting: Gastroenterology

## 2019-11-04 NOTE — Telephone Encounter (Signed)
Patient needs office visit.

## 2019-11-15 ENCOUNTER — Other Ambulatory Visit: Payer: Self-pay

## 2019-11-15 ENCOUNTER — Ambulatory Visit: Payer: Medicare Other | Admitting: Gastroenterology

## 2019-11-15 ENCOUNTER — Encounter: Payer: Self-pay | Admitting: Gastroenterology

## 2019-11-15 VITALS — BP 130/74 | HR 72 | Temp 98.7°F | Ht 62.5 in | Wt 154.5 lb

## 2019-11-15 DIAGNOSIS — K5150 Left sided colitis without complications: Secondary | ICD-10-CM | POA: Diagnosis not present

## 2019-11-15 MED ORDER — SUTAB 1479-225-188 MG PO TABS: 1.0000 | ORAL_TABLET | Freq: Once | ORAL | 0 refills | Status: AC

## 2019-11-15 MED ORDER — MESALAMINE 1.2 G PO TBEC: 3.6000 g | DELAYED_RELEASE_TABLET | Freq: Every day | ORAL | 2 refills | Status: DC

## 2019-11-15 NOTE — Patient Instructions (Addendum)
If you are age 69 or older, your body mass index should be between 23-30. Your Body mass index is 27.81 kg/m. If this is out of the aforementioned range listed, please consider follow up with your Primary Care Provider.  If you are age 71 or younger, your body mass index should be between 19-25. Your Body mass index is 27.81 kg/m. If this is out of the aformentioned range listed, please consider follow up with your Primary Care Provider.   You have been scheduled for a colonoscopy. Please follow written instructions given to you at your visit today.  Please pick up your prep supplies at the pharmacy within the next 1-3 days. If you use inhalers (even only as needed), please bring them with you on the day of your procedure. Your physician has requested that you go to www.startemmi.com and enter the access code given to you at your visit today. This web site gives a general overview about your procedure. However, you should still follow specific instructions given to you by our office regarding your preparation for the procedure.  We have sent the following medications to your pharmacy for you to pick up at your convenience: Doristine Johns  We will request your labs from Dr. Thompson Caul office.  Thank you for entrusting me with your care and for choosing [facility], Dr. Millen Cellar

## 2019-11-15 NOTE — Progress Notes (Signed)
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 Olevia Perches. She was initially treated with Lialda 2.4gm daily as well as Hydrocortizone enemas which helped her symptoms. She has been maintained with Lialda since that time.We added Rowasa enemas to use as needed at one point time.  SINCE LAST VISIT  69 year old female here for a follow-up visit for left-sided colitis.  Last time I saw her was April 2020.  She has been taking 3 Lialda tablets per day since her last visit, not been using Rowasa at all but she does have some at home.  She states she has on average 4 bowel movements per day but sometimes upwards of 8 bowel movements a day.  Not much urgency and she states her stool is formed.  Her prior baseline before colitis was perhaps 1 bowel movement per week.  She states she is never got back to baseline and has had some mild persistent symptoms over the years.  She tolerates the mesalamine well.  Her last colonoscopy was done in March 2019 where she had one adenoma removed and no significant inflammatory changes although some was seen microscopically.  Her prep was fair at the time.  We had discussed using Rowasa as needed but she has not felt like she needs to use that at all.  She denies any blood in the stool.  She does use Advil once every 2 to 4 days for headaches.  She is up-to-date with her Covid vaccine  She has had labs done at her primary care she thinks within the past year but do not have copy that today, Labs from Dr. Thompson Caul office as outlined:  08/17/2018: WBC 12.1, Hgb 14.7, HCT 41.0, plt 290  Normal renal and kidney function   Colonoscopy 09/10/2017 - Preparation of the colon was fair, could not clear the cecal cap. - The examined portion of the ileum was normal. - One 4 mm polyp in the transverse colon, removed with a cold snare. Resected and retrieved. - Diverticulosis in the sigmoid colon. - The examination was otherwise normal on direct  and retroflexion views. No active inflammation. - Biopsies for surveillance were taken from the descending colon, sigmoid colon and rectum  Path showed focal active colitis, adenomatous polyp  Colonoscopy December 2015 - left sided colitis, path c/w chronic active colitis   Past Medical History:  Diagnosis Date  . Apnea, sleep    no c-pap machine  . Arthritis    OA  . Basal cell carcinoma    on front chest  . Blood in stool    LAST 2 MONTHS, MEDS CHNAGED FOR  . GERD (gastroesophageal reflux disease)   . Hyperlipidemia   . Hypertension   . Laryngitis    started monday night  . Neuromuscular disorder ([place])   . Post-operative nausea and vomiting   . Stroke ([place]) 09/21/2007  . Synovitis of right hand 12/29/15   and wrist  . Transient global amnesia 2009  . Ulcerative colitis ([place])   . Weakness of right hand    due to stroke     Past Surgical History:  Procedure Laterality Date  . COLONSCOPY    . SHOULDER ARTHROSCOPY W/ ROTATOR CUFF REPAIR Right 2010  . 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 TOTAL KNEE ARTHROPLASTY Left 2001  . TOTAL KNEE ARTHROPLASTY Right 12/31/2015   Procedure: RIGHT TOTAL KNEE ARTHROPLASTY;  Surgeon: Paralee Cancel, MD;  Location: [facility];  Service: Orthopedics;  Laterality: Right;   Family History  Problem Relation Age of Onset  . Diabetes Father   . Mental illness Father   . Narcolepsy Father   . Heart disease Paternal Grandmother   . Diabetes Paternal Grandmother   . Heart attack Maternal Grandfather   . Stroke Maternal Grandmother   . Colon cancer Neg Hx   . Esophageal cancer Neg Hx   . Rectal cancer Neg Hx   . Stomach cancer Neg Hx    Social History   Tobacco Use  . Smoking status: Former Smoker    Types: Cigarettes    Quit date: 06/20/1989    Years since quitting: 30.4  . Smokeless tobacco: Never Used  . Tobacco comment: smoked occasional cigarettes for 5 years  Substance Use Topics  . Alcohol use: No     Alcohol/week: 0.0 standard drinks  . Drug use: No   Current Outpatient Medications  Medication Sig Dispense Refill  . alendronate (FOSAMAX) 70 MG tablet Take 70 mg by mouth once a week. Take with a full glass of water on an empty stomach.    . AMBULATORY NON FORMULARY MEDICATION Panothetic Acid B5 Take 1 tablet by mouth once daily    . Cholecalciferol (VITAMIN D PO) Take 1 tablet by mouth daily.    . Cyanocobalamin (VITAMIN B-12 PO) Take 1 tablet by mouth daily.    . ferrous sulfate 325 (65 FE) MG tablet Take 1 tablet (325 mg total) by mouth 3 (three) times daily after meals. (Patient taking differently: Take 325 mg by mouth daily. )  3  . mesalamine (LIALDA) 1.2 g EC tablet TAKE 3 TABTS BY MOUTH DAILY WITH BREAKFAST. PLEASE SCHEDULE AN OFFICE VISIT FOR REFILLS.LAST [id number] 90 tablet 0  . metoprolol succinate (TOPROL-XL) 50 MG 24 hr tablet Take 50 mg by mouth daily. Take with or immediately following a meal.    . Misc Natural Products (OSTEO BI-FLEX TRIPLE STRENGTH) TABS Take 1 tablet by mouth daily.    Marland Kitchen POTASSIUM PO Take 100 mg by mouth daily. OTC potassium    . Pyridoxine HCl (VITAMIN B-6 PO) Take 1 tablet by mouth daily.    . rosuvastatin (CRESTOR) 20 MG tablet Take 20 mg by mouth daily.    Marland Kitchen thiamine (VITAMIN B-1) 100 MG tablet Take 100 mg by mouth daily.    . vitamin E 100 UNIT capsule Take by mouth daily.     No current facility-administered medications for this visit.   Allergies  Allergen Reactions  . Codeine Nausea And Vomiting  . Morphine And Related     Causes hallucinations      Review of Systems: All systems reviewed and negative except where noted in HPI.     Physical Exam: BP 130/74   Pulse 72   Temp 98.7 F (37.1 C)   Ht 5' 2.5" (1.588 m)   Wt 154 lb 8 oz (70.1 kg)   BMI 27.81 kg/m  Constitutional: Pleasant,well-developed, female in no acute distress. HEENT: Normocephalic and atraumatic. Conjunctivae are normal. No scleral icterus. Neck supple.    Cardiovascular: Normal rate, regular rhythm.  Pulmonary/chest: Effort normal and breath sounds normal. No wheezing, rales or rhonchi. Abdominal: Soft, nondistended, nontender.. There are no masses palpable.  Extremities: no edema Lymphadenopathy: No cervical adenopathy noted. Neurological: Alert and oriented to person place and time. Skin: Skin is warm and dry. No rashes noted. Psychiatric: Normal mood and affect. Behavior is normal.   ASSESSMENT AND PLAN: 69  y/o female here for reassessment of the following:  Left sided UC - diagnosis in 2015, generally she is doing okay with mesalamine without any significant flares however her stool frequency is higher than would expect compared to her prior baseline.  Her last colonoscopy did not show any significant inflammation but based on symptoms I am concerned she is not in remission.  I discussed options with her.  The preparation of her last colonoscopy was fair.  Given her ongoing symptoms, recommend colonoscopy to restage her disease on this regimen and see if we need to escalate therapy.  I discussed risk and benefits of colonoscopy and anesthesia and she wanted to proceed, will use prep and a half for this exam and can complete full second prep if needed.  She will continue her present dosing of Lialda until this exam is done.  Further recommendations pending the results.  I otherwise advised her to minimize her NSAID use which can lead to colitis flares, she should use Tylenol as needed for her headaches.  She agreed  Benton Cellar, MD [facility]

## 2019-11-24 ENCOUNTER — Telehealth: Payer: Self-pay | Admitting: Gastroenterology

## 2019-11-24 NOTE — Telephone Encounter (Signed)
Labs arrived from PCP as outlined:  09/05/2019  Hgb 14.3, WBC 8.1, MCV 91.9, plt 300 BUN 20, Cr 0.61, T bil 1.2, AP 69, AST 30, ALT 26  Will await results of colonoscopy

## 2019-11-28 ENCOUNTER — Other Ambulatory Visit: Payer: Self-pay

## 2019-11-28 ENCOUNTER — Ambulatory Visit (AMBULATORY_SURGERY_CENTER): Payer: Medicare Other | Admitting: Gastroenterology

## 2019-11-28 ENCOUNTER — Encounter: Payer: Self-pay | Admitting: Gastroenterology

## 2019-11-28 VITALS — BP 100/55 | HR 65 | Temp 97.3°F | Resp 20 | Ht 62.0 in | Wt 154.0 lb

## 2019-11-28 DIAGNOSIS — K5150 Left sided colitis without complications: Secondary | ICD-10-CM | POA: Diagnosis not present

## 2019-11-28 DIAGNOSIS — K529 Noninfective gastroenteritis and colitis, unspecified: Secondary | ICD-10-CM | POA: Diagnosis not present

## 2019-11-28 MED ORDER — SODIUM CHLORIDE 0.9 % IV SOLN: 500.0000 mL | Freq: Once | INTRAVENOUS | Status: DC

## 2019-11-28 NOTE — Patient Instructions (Signed)
Handouts provided on diverticulosis and hemorrhoids.   YOU HAD AN ENDOSCOPIC PROCEDURE TODAY AT [facility]:   Refer to the procedure report that was given to you for any specific questions about what was found during the examination.  If the procedure report does not answer your questions, please call your gastroenterologist to clarify.  If you requested that your care partner not be given the details of your procedure findings, then the procedure report has been included in a sealed envelope for you to review at your convenience later.  YOU SHOULD EXPECT: Some feelings of bloating in the abdomen. Passage of more gas than usual.  Walking can help get rid of the air that was put into your GI tract during the procedure and reduce the bloating. If you had a lower endoscopy (such as a colonoscopy or flexible sigmoidoscopy) you may notice spotting of blood in your stool or on the toilet paper. If you underwent a bowel prep for your procedure, you may not have a normal bowel movement for a few days.  Please Note:  You might notice some irritation and congestion in your nose or some drainage.  This is from the oxygen used during your procedure.  There is no need for concern and it should clear up in a day or so.  SYMPTOMS TO REPORT IMMEDIATELY:   Following lower endoscopy (colonoscopy or flexible sigmoidoscopy):  Excessive amounts of blood in the stool  Significant tenderness or worsening of abdominal pains  Swelling of the abdomen that is new, acute  Fever of 100F or higher  For urgent or emergent issues, a gastroenterologist can be reached at any hour by calling [phone]. Do not use MyChart messaging for urgent concerns.    DIET:  We do recommend a small meal at first, but then you may proceed to your regular diet.  Drink plenty of fluids but you should avoid alcoholic beverages for 24 hours.  ACTIVITY:  You should plan to take it easy for the rest of today and you should  NOT DRIVE or use heavy machinery until tomorrow (because of the sedation medicines used during the test).    FOLLOW UP: Our staff will call the number listed on your records 48-72 hours following your procedure to check on you and address any questions or concerns that you may have regarding the information given to you following your procedure. If we do not reach you, we will leave a message.  We will attempt to reach you two times.  During this call, we will ask if you have developed any symptoms of COVID 19. If you develop any symptoms (ie: fever, flu-like symptoms, shortness of breath, cough etc.) before then, please call [phone].  If you test positive for Covid 19 in the 2 weeks post procedure, please call and report this information to [place].    If any biopsies were taken you will be contacted by phone or by letter within the next 1-3 weeks.  Please call us at [phone] if you have not heard about the biopsies in 3 weeks.    SIGNATURES/CONFIDENTIALITY: You and/or your care partner have signed paperwork which will be entered into your electronic medical record.  These signatures attest to the fact that that the information above on your After Visit Summary has been reviewed and is understood.  Full responsibility of the confidentiality of this discharge information lies with you and/or your care-partner.

## 2019-11-28 NOTE — Progress Notes (Signed)
Called to room to assist during endoscopic procedure.  Patient ID and intended procedure confirmed with present staff. Received instructions for my participation in the procedure from the performing physician.  

## 2019-11-28 NOTE — Progress Notes (Signed)
Pt's states no medical or surgical changes since previsit or office visit. 

## 2019-11-28 NOTE — Op Note (Signed)
[place] Patient Name: Hannah Willis Procedure Date: 11/28/2019 2:14 PM MRN: [id number] Endoscopist: Remo Lipps P. Havery Moros , MD Age: 69 Referring MD:  Date of Birth: 02/28/1951 Gender: Female Account #: [id number] Procedure:                Colonoscopy Indications:              High risk colon cancer surveillance: Ulcerative                            left sided colitis on Lialda monotherapy with some                            increased stool frequency, surveillance exam Medicines:                Monitored Anesthesia Care Procedure:                Pre-Anesthesia Assessment:                           - Prior to the procedure, a History and Physical                            was performed, and patient medications and                            allergies were reviewed. The patient's tolerance of                            previous anesthesia was also reviewed. The risks                            and benefits of the procedure and the sedation                            options and risks were discussed with the patient.                            All questions were answered, and informed consent                            was obtained. Prior Anticoagulants: The patient has                            taken no previous anticoagulant or antiplatelet                            agents. ASA Grade Assessment: II - A patient with                            mild systemic disease. After reviewing the risks                            and benefits, the patient was deemed in  satisfactory condition to undergo the procedure.                           After obtaining informed consent, the colonoscope                            was passed under direct vision. Throughout the                            procedure, the patient's blood pressure, pulse, and                            oxygen saturations were monitored continuously. The   Colonoscope was introduced through the anus and                            advanced to the the cecum, identified by                            appendiceal orifice and ileocecal valve. The                            colonoscopy was performed without difficulty. The                            patient tolerated the procedure well. The quality                            of the bowel preparation was good. The ileocecal                            valve, appendiceal orifice, and rectum were                            photographed. Scope In: 2:30:09 PM Scope Out: 2:48:19 PM Scope Withdrawal Time: 0 hours 15 minutes 6 seconds  Total Procedure Duration: 0 hours 18 minutes 10 seconds  Findings:                 The perianal and digital rectal examinations were                            normal.                           Multiple small-mouthed diverticula were found in                            the sigmoid colon and ascending colon.                           A small area of granular mucosa with loss of                            vascularity - mild was found in the recto-sigmoid  colon in an area of diverticulosis - unclear if                            superficial changes from diverticulitis vs. mild                            active coliis.                           Internal hemorrhoids were found during                            retroflexion. The hemorrhoids were small.                           The exam was otherwise without abnormality. No                            other areas of inflammation appreciated. No polyps.                           Biopsies were taken with a cold forceps in the                            rectum, in the sigmoid colon, in the descending                            colon and at the splenic flexure for histology. Complications:            No immediate complications. Estimated blood loss:                            Minimal. Estimated Blood Loss:      Estimated blood loss was minimal. Impression:               - Diverticulosis in the sigmoid colon and in the                            ascending colon.                           - Mucosa in the recto-sigmoid colon as described                            above.                           - Internal hemorrhoids.                           - The examination was otherwise normal.                           - Biopsies were taken with a cold forceps for  histology in the rectum, in the sigmoid colon, in                            the descending colon and at the splenic flexure. Recommendation:           - Patient has a contact number available for                            emergencies. The signs and symptoms of potential                            delayed complications were discussed with the                            patient. Return to normal activities tomorrow.                            Written discharge instructions were provided to the                            patient.                           - Resume previous diet.                           - Continue present medications.                           - Await pathology results with further                            recommendations Remo Lipps P. Vernie Piet, MD 11/28/2019 2:57:30 PM This report has been signed electronically.

## 2019-11-28 NOTE — Progress Notes (Signed)
A/ox3, pleased with MAC, report to RN 

## 2019-11-30 ENCOUNTER — Telehealth: Payer: Self-pay

## 2019-11-30 NOTE — Telephone Encounter (Signed)
  Follow up Call-  Call back number 11/28/2019 09/10/2017  Post procedure Call Back phone  # [phone]  Permission to leave phone message Yes Yes  Some recent data might be hidden     Patient questions:  Do you have a fever, pain , or abdominal swelling? No. Pain Score  0 *  Have you tolerated food without any problems? Yes.    Have you been able to return to your normal activities? Yes.    Do you have any questions about your discharge instructions: Diet   No. Medications  No. Follow up visit  No.  Do you have questions or concerns about your Care? No.  Actions: * If pain score is 4 or above: 1. No action needed, pain <4.Have you developed a fever since your procedure? no  2.   Have you had an respiratory symptoms (SOB or cough) since your procedure? no  3.   Have you tested positive for COVID 19 since your procedure no  4.   Have you had any family members/close contacts diagnosed with the COVID 19 since your procedure?  no   If yes to any of these questions please route to Joylene John, RN and Erenest Rasher, RN

## 2019-11-30 NOTE — Telephone Encounter (Signed)
No answer on follow up call.

## 2019-12-06 ENCOUNTER — Encounter: Payer: Self-pay | Admitting: Gastroenterology

## 2019-12-06 ENCOUNTER — Telehealth: Payer: Self-pay | Admitting: Gastroenterology

## 2019-12-06 NOTE — Telephone Encounter (Signed)
Sorry to hear this. Her colonoscopy shows that her colitis is generally pretty well controlled with the Lialda, I don't think causing her pain. If she is having persistent LLQ pain may be reasonable to pursue CT abdomen / pelvis with contrast to further evaluate. She has had normal labs in February. Can you coordinate if she is willing? Thanks

## 2019-12-06 NOTE — Telephone Encounter (Signed)
Patient reports LLQ pain. This is the same pain she has reported in the past, but it is getting stronger.  She is taking her lialda 3 tabs daily.  Dr. Havery Moros please advise

## 2019-12-06 NOTE — Telephone Encounter (Signed)
Patient notified of the response and recommendations. She declines the CT for now.  She understands to call back if she changes her mind.

## 2020-03-29 ENCOUNTER — Other Ambulatory Visit: Payer: Self-pay | Admitting: Gastroenterology

## 2020-09-10 DIAGNOSIS — Z23 Encounter for immunization: Secondary | ICD-10-CM | POA: Diagnosis not present

## 2020-09-10 DIAGNOSIS — E7800 Pure hypercholesterolemia, unspecified: Secondary | ICD-10-CM | POA: Diagnosis not present

## 2020-09-10 DIAGNOSIS — I679 Cerebrovascular disease, unspecified: Secondary | ICD-10-CM | POA: Diagnosis not present

## 2020-09-10 DIAGNOSIS — K5190 Ulcerative colitis, unspecified, without complications: Secondary | ICD-10-CM | POA: Diagnosis not present

## 2020-09-10 DIAGNOSIS — I10 Essential (primary) hypertension: Secondary | ICD-10-CM | POA: Diagnosis not present

## 2020-09-10 DIAGNOSIS — Z0000 Encounter for general adult medical examination without abnormal findings: Secondary | ICD-10-CM | POA: Diagnosis not present

## 2020-09-10 DIAGNOSIS — Z1159 Encounter for screening for other viral diseases: Secondary | ICD-10-CM | POA: Diagnosis not present

## 2020-09-10 DIAGNOSIS — Z1389 Encounter for screening for other disorder: Secondary | ICD-10-CM | POA: Diagnosis not present

## 2020-09-10 DIAGNOSIS — M810 Age-related osteoporosis without current pathological fracture: Secondary | ICD-10-CM | POA: Diagnosis not present

## 2020-09-24 ENCOUNTER — Other Ambulatory Visit: Payer: Self-pay | Admitting: Gastroenterology

## 2020-11-05 DIAGNOSIS — Z6824 Body mass index (BMI) 24.0-24.9, adult: Secondary | ICD-10-CM | POA: Diagnosis not present

## 2020-11-08 DIAGNOSIS — Z1231 Encounter for screening mammogram for malignant neoplasm of breast: Secondary | ICD-10-CM | POA: Diagnosis not present

## 2020-11-28 ENCOUNTER — Other Ambulatory Visit: Payer: Self-pay | Admitting: Gastroenterology

## 2021-02-08 DIAGNOSIS — M5442 Lumbago with sciatica, left side: Secondary | ICD-10-CM | POA: Diagnosis not present

## 2021-02-14 DIAGNOSIS — M5442 Lumbago with sciatica, left side: Secondary | ICD-10-CM | POA: Diagnosis not present

## 2021-02-14 DIAGNOSIS — R001 Bradycardia, unspecified: Secondary | ICD-10-CM | POA: Diagnosis not present

## 2021-02-18 ENCOUNTER — Encounter: Payer: Self-pay | Admitting: Nurse Practitioner

## 2021-02-18 ENCOUNTER — Other Ambulatory Visit (INDEPENDENT_AMBULATORY_CARE_PROVIDER_SITE_OTHER): Payer: Medicare Other

## 2021-02-18 ENCOUNTER — Ambulatory Visit: Payer: Medicare Other | Admitting: Nurse Practitioner

## 2021-02-18 VITALS — BP 120/60 | HR 68 | Ht 62.5 in | Wt 148.0 lb

## 2021-02-18 DIAGNOSIS — K51819 Other ulcerative colitis with unspecified complications: Secondary | ICD-10-CM

## 2021-02-18 LAB — BASIC METABOLIC PANEL
BUN: 15 mg/dL (ref 6–23)
CO2: 28 mEq/L (ref 19–32)
Calcium: 9.2 mg/dL (ref 8.4–10.5)
Chloride: 106 mEq/L (ref 96–112)
Creatinine, Ser: 0.56 mg/dL (ref 0.40–1.20)
GFR: 92.66 mL/min (ref >60.00)
Glucose, Bld: 92 mg/dL (ref 70–99)
Potassium: 4.3 mEq/L (ref 3.5–5.1)
Sodium: 140 mEq/L (ref 135–145)

## 2021-02-18 MED ORDER — MESALAMINE 1.2 G PO TBEC: DELAYED_RELEASE_TABLET | ORAL | 0 refills | Status: DC

## 2021-02-18 NOTE — Progress Notes (Signed)
ASSESSMENT AND PLAN     #70 year old female with left-sided ulcerative colitis diagnosed in 2015.  Minimally active chronic colitis involving the rectosigmoid colon on last colonoscopy in 2021.  Patient is doing well on the Lialda 3.6 times daily --Continue Lialda 3.6 g daily.  Will refill for 90 days and give four additional refills --Bmet to check renal function today ( on Mesalamine) --Follow up in one year,  or sooner if needed  HISTORY OF PRESENT ILLNESS    Chief Complaint : Needs a refill on mesalamine  Hannah Willis is a 70 y.o. female known to Dr. Havery Moros with a past medical history significant for ulcerative colitis, hypertension, hyperlipidemia, CVA see PMH below for any additional medical problems.     Patient followed by Dr. Havery Moros for left-sided ulcerative colitis diagnosed in 2015.  She has been maintained on Lialda ( tablets daily).   Minimally active chronic colitis involving the rectosigmoid colon on her last colonoscopy in May 2021.  Jasara feels well.  She did recently require a course of steroids for back pain after doing a lot of sewing.  Her bowel movements are at baseline consisting of 6-8 formed stools a day.  Stools do not contain any blood.  She has no abdominal pain.  She is taking Lialda 3.6 g daily, has not required any Row[place] enemas.  She is taking vitamin D and calcium supplements.  She is on Fosamax.    PREVIOUS ENDOSCOPIC EVALUATIONS / PERTINENT STUDIES:   May 2021 - Multiple small-mouthed diverticula were found in the sigmoid colon and ascending colon. - A small area of granular mucosa with loss of vascularity - mild was found in the rectosigmoid colon in an area of diverticulosis - unclear if superficial changes from diverticulitis vs. mild active coliis. - Internal hemorrhoids were found during retroflexion. The hemorrhoids were small. - The exam was otherwise -Biopsies were taken with a cold forceps for histology in the rectum,  in the sigmoid colon, in the descending colon and at the splenic flexure.  Diagnosis 1. Surgical [P], random left colon bx - FOCAL EROSION WITH MILD INFLAMMATION. - NEGATIVE FOR DYSPLASIA. 2. Surgical [P], colon, rectum/rectosigmoid - MINIMALLY ACTIVE CHRONIC COLITIS. - NEGATIVE FOR DYSPLASIA. - NO GRANULOMAS.    Past Medical History:  Diagnosis Date   Apnea, sleep    no c-pap machine   Arthritis    OA   Basal cell carcinoma    on front chest   Blood in stool    LAST 2 MONTHS, MEDS CHNAGED FOR   GERD (gastroesophageal reflux disease)    Hyperlipidemia    Hypertension    Laryngitis    started monday night   Neuromuscular disorder ([place])    Post-operative nausea and vomiting    Stroke ([place]) 09/21/2007   Synovitis of right hand 12/29/15   and wrist   Transient global amnesia 2009   Ulcerative colitis ([place])    Weakness of right hand    due to stroke    Current Medications, Allergies, Past Surgical History, Family History and Social History were reviewed in [employer] record.   Current Outpatient Medications  Medication Sig Dispense Refill   alendronate (FOSAMAX) 70 MG tablet Take 70 mg by mouth once a week. Take with a full glass of water on an empty stomach.     AMBULATORY NON FORMULARY MEDICATION Panothetic Acid B5 Take 1 tablet by mouth once daily     Cholecalciferol (VITAMIN D PO) Take  1 tablet by mouth daily.     Cyanocobalamin (VITAMIN B-12 PO) Take 1 tablet by mouth daily.     ferrous sulfate 325 (65 FE) MG tablet Take 1 tablet (325 mg total) by mouth 3 (three) times daily after meals. (Patient taking differently: Take 325 mg by mouth daily.)  3   mesalamine (LIALDA) 1.2 g EC tablet TAKE 3 TABLETS (3.6 G TOTAL) BY MOUTH DAILY WITH BREAKFAST. PLEASE SCHEDULE A FOLLOW UP APPOINTMENT 270 tablet 0   metoprolol succinate (TOPROL-XL) 50 MG 24 hr tablet Take 50 mg by mouth daily. Take with or immediately following a meal.     Misc Natural Products  (OSTEO BI-FLEX TRIPLE STRENGTH) TABS Take 1 tablet by mouth daily.     POTASSIUM PO Take 100 mg by mouth daily. OTC potassium     Pyridoxine HCl (VITAMIN B-6 PO) Take 1 tablet by mouth daily.     rosuvastatin (CRESTOR) 20 MG tablet Take 20 mg by mouth daily.     thiamine (VITAMIN B-1) 100 MG tablet Take 100 mg by mouth daily.     vitamin E 100 UNIT capsule Take by mouth daily.     No current facility-administered medications for this visit.    Review of Systems: No chest pain. No shortness of breath. No urinary complaints.   PHYSICAL EXAM :    Wt Readings from Last 3 Encounters:  02/18/21 148 lb (67.1 kg)  11/28/19 154 lb (69.9 kg)  11/15/19 154 lb 8 oz (70.1 kg)    BP 120/60   Pulse 68   Ht 5' 2.5" (1.588 m)   Wt 148 lb (67.1 kg)   SpO2 97%   BMI 26.64 kg/m  Constitutional:  Pleasant female in no acute distress. Psychiatric: Normal mood and affect. Behavior is normal. EENT: Pupils normal.  Conjunctivae are normal. No scleral icterus. Neck supple.  Cardiovascular: Normal rate, regular rhythm. No edema Pulmonary/chest: Effort normal and breath sounds normal. No wheezing, rales or rhonchi. Abdominal: Soft, nondistended, nontender. Bowel sounds active throughout. There are no masses palpable. No hepatomegaly. Neurological: Alert and oriented to person place and time. Skin: Skin is warm and dry. No rashes noted.  Tye Savoy, NP  02/18/2021, 2:39 PM

## 2021-02-18 NOTE — Patient Instructions (Addendum)
If you are age 71 or older, your body mass index should be between 23-30. Your Body mass index is 26.64 kg/m. If this is out of the aforementioned range listed, please consider follow up with your Primary Care Provider.  LABS:  Lab work has been ordered for you today. Our lab is located in the basement. Press "B" on the elevator. The lab is located at the first door on the left as you exit the elevator.  We have sent in a refill on your Mesalamine.  It was great seeing you today! Thank you for entrusting me with your care and choosing [facility].  Tye Savoy, NP

## 2021-02-18 NOTE — Progress Notes (Signed)
Agree with assessment / plan as outlined.

## 2021-02-20 DIAGNOSIS — M6281 Muscle weakness (generalized): Secondary | ICD-10-CM | POA: Diagnosis not present

## 2021-02-20 DIAGNOSIS — M545 Low back pain, unspecified: Secondary | ICD-10-CM | POA: Diagnosis not present

## 2021-02-26 DIAGNOSIS — M545 Low back pain, unspecified: Secondary | ICD-10-CM | POA: Diagnosis not present

## 2021-02-26 DIAGNOSIS — M6281 Muscle weakness (generalized): Secondary | ICD-10-CM | POA: Diagnosis not present

## 2021-03-05 DIAGNOSIS — M6281 Muscle weakness (generalized): Secondary | ICD-10-CM | POA: Diagnosis not present

## 2021-03-05 DIAGNOSIS — M545 Low back pain, unspecified: Secondary | ICD-10-CM | POA: Diagnosis not present

## 2021-03-12 DIAGNOSIS — M545 Low back pain, unspecified: Secondary | ICD-10-CM | POA: Diagnosis not present

## 2021-03-12 DIAGNOSIS — M6281 Muscle weakness (generalized): Secondary | ICD-10-CM | POA: Diagnosis not present

## 2021-03-13 DIAGNOSIS — E7800 Pure hypercholesterolemia, unspecified: Secondary | ICD-10-CM | POA: Diagnosis not present

## 2021-03-13 DIAGNOSIS — I10 Essential (primary) hypertension: Secondary | ICD-10-CM | POA: Diagnosis not present

## 2021-03-13 DIAGNOSIS — I679 Cerebrovascular disease, unspecified: Secondary | ICD-10-CM | POA: Diagnosis not present

## 2021-03-18 DIAGNOSIS — H2513 Age-related nuclear cataract, bilateral: Secondary | ICD-10-CM | POA: Diagnosis not present

## 2021-03-18 DIAGNOSIS — H52203 Unspecified astigmatism, bilateral: Secondary | ICD-10-CM | POA: Diagnosis not present

## 2021-03-18 DIAGNOSIS — H31002 Unspecified chorioretinal scars, left eye: Secondary | ICD-10-CM | POA: Diagnosis not present

## 2021-03-19 DIAGNOSIS — M545 Low back pain, unspecified: Secondary | ICD-10-CM | POA: Diagnosis not present

## 2021-04-16 ENCOUNTER — Other Ambulatory Visit: Payer: Self-pay | Admitting: Nurse Practitioner

## 2021-07-26 ENCOUNTER — Other Ambulatory Visit: Payer: Self-pay | Admitting: Nurse Practitioner

## 2021-09-24 DIAGNOSIS — Z1389 Encounter for screening for other disorder: Secondary | ICD-10-CM | POA: Diagnosis not present

## 2021-09-24 DIAGNOSIS — Z9081 Acquired absence of spleen: Secondary | ICD-10-CM | POA: Diagnosis not present

## 2021-09-24 DIAGNOSIS — E7800 Pure hypercholesterolemia, unspecified: Secondary | ICD-10-CM | POA: Diagnosis not present

## 2021-09-24 DIAGNOSIS — I341 Nonrheumatic mitral (valve) prolapse: Secondary | ICD-10-CM | POA: Diagnosis not present

## 2021-09-24 DIAGNOSIS — M810 Age-related osteoporosis without current pathological fracture: Secondary | ICD-10-CM | POA: Diagnosis not present

## 2021-09-24 DIAGNOSIS — I10 Essential (primary) hypertension: Secondary | ICD-10-CM | POA: Diagnosis not present

## 2021-09-24 DIAGNOSIS — I679 Cerebrovascular disease, unspecified: Secondary | ICD-10-CM | POA: Diagnosis not present

## 2021-09-24 DIAGNOSIS — Z0000 Encounter for general adult medical examination without abnormal findings: Secondary | ICD-10-CM | POA: Diagnosis not present

## 2021-09-25 ENCOUNTER — Other Ambulatory Visit: Payer: Self-pay | Admitting: Family Medicine

## 2021-09-25 DIAGNOSIS — M810 Age-related osteoporosis without current pathological fracture: Secondary | ICD-10-CM

## 2021-09-25 DIAGNOSIS — E2839 Other primary ovarian failure: Secondary | ICD-10-CM

## 2021-10-16 ENCOUNTER — Telehealth: Payer: Self-pay | Admitting: Hematology and Oncology

## 2021-10-16 NOTE — Telephone Encounter (Signed)
Pt called in to sch appt. Scheduled appt per 3/27 referral. Pt is aware of appt date and time. Pt is aware to arrive 15 mins prior to appt time and to bring and updated insurance card. Pt is aware of appt location.   ?

## 2021-11-04 ENCOUNTER — Inpatient Hospital Stay: Payer: Medicare Other

## 2021-11-04 ENCOUNTER — Other Ambulatory Visit: Payer: Self-pay

## 2021-11-04 ENCOUNTER — Inpatient Hospital Stay: Payer: Medicare Other | Attending: Hematology and Oncology | Admitting: Hematology and Oncology

## 2021-11-04 VITALS — BP 164/74 | HR 70 | Temp 97.8°F | Resp 16 | Wt 153.7 lb

## 2021-11-04 DIAGNOSIS — D729 Disorder of white blood cells, unspecified: Secondary | ICD-10-CM

## 2021-11-04 DIAGNOSIS — Z87891 Personal history of nicotine dependence: Secondary | ICD-10-CM | POA: Diagnosis not present

## 2021-11-04 DIAGNOSIS — D72828 Other elevated white blood cell count: Secondary | ICD-10-CM | POA: Insufficient documentation

## 2021-11-04 LAB — CMP (CANCER CENTER ONLY)
ALT: 18 U/L (ref 0–44)
AST: 25 U/L (ref 15–41)
Albumin: 4.2 g/dL (ref 3.5–5.0)
Alkaline Phosphatase: 52 U/L (ref 38–126)
Anion gap: 5 (ref 5–15)
BUN: 12 mg/dL (ref 8–23)
CO2: 27 mmol/L (ref 22–32)
Calcium: 9.4 mg/dL (ref 8.9–10.3)
Chloride: 109 mmol/L (ref 98–111)
Creatinine: 0.65 mg/dL (ref 0.44–1.00)
GFR, Estimated: >60 mL/min (ref >60)
Glucose, Bld: 92 mg/dL (ref 70–99)
Potassium: 4.2 mmol/L (ref 3.5–5.1)
Sodium: 141 mmol/L (ref 135–145)
Total Bilirubin: 0.9 mg/dL (ref 0.3–1.2)
Total Protein: 6.8 g/dL (ref 6.5–8.1)

## 2021-11-04 LAB — CBC WITH DIFFERENTIAL (CANCER CENTER ONLY)
Abs Immature Granulocytes: 0.04 10*3/uL (ref 0.00–0.07)
Basophils Absolute: 0.2 10*3/uL — ABNORMAL HIGH (ref 0.0–0.1)
Basophils Relative: 2 %
Eosinophils Absolute: 0.5 10*3/uL (ref 0.0–0.5)
Eosinophils Relative: 6 %
HCT: 39.7 % (ref 36.0–46.0)
Hemoglobin: 14.2 g/dL (ref 12.0–15.0)
Immature Granulocytes: 0 %
Lymphocytes Relative: 34 %
Lymphs Abs: 3 10*3/uL (ref 0.7–4.0)
MCH: 33.9 pg (ref 26.0–34.0)
MCHC: 35.8 g/dL (ref 30.0–36.0)
MCV: 94.7 fL (ref 80.0–100.0)
Monocytes Absolute: 1.2 10*3/uL — ABNORMAL HIGH (ref 0.1–1.0)
Monocytes Relative: 13 %
Neutro Abs: 4.1 10*3/uL (ref 1.7–7.7)
Neutrophils Relative %: 45 %
Platelet Count: 264 10*3/uL (ref 150–400)
RBC: 4.19 MIL/uL (ref 3.87–5.11)
RDW: 12.7 % (ref 11.5–15.5)
WBC Count: 9.1 10*3/uL (ref 4.0–10.5)
nRBC: 0 % (ref 0.0–0.2)

## 2021-11-04 LAB — C-REACTIVE PROTEIN: CRP: 0.6 mg/dL (ref <1.0)

## 2021-11-04 LAB — SEDIMENTATION RATE: Sed Rate: 8 mm/hr (ref 0–22)

## 2021-11-04 LAB — LACTATE DEHYDROGENASE: LDH: 201 U/L — ABNORMAL HIGH (ref 98–192)

## 2021-11-04 NOTE — Progress Notes (Signed)
[facility] [phone]   [phone]  INITIAL CONSULT NOTE  Patient Care Team: Merri Brunette, MD as PCP - General (Family Medicine)  Hematological/Oncological History # Leukocytosis, Neutrophilic Predominance 09/24/2021: WBC 12.3, Hgb 14.4, MCV 94.4, Plt 253 11/04/2021: establish care with Dr. Leonides Schanz   CHIEF COMPLAINTS/PURPOSE OF CONSULTATION:  "Leukocytosis "  HISTORY OF PRESENTING ILLNESS:  Hannah Willis 71 y.o. female with medical history significant for sleep apnea (without use of CPAP), HLD, HTN, and ulcerative colitis who presents for evaluation of leukocytosis.   On review of the previous records Hannah Willis has had sporadic episodes of leukocytosis dating back to at least 2010. ON 08/04/2014 she had WBC 13.9 with neutrophilic predominance, Hgb 14.8, Plt 284. These normalized by 04/04/2015 at which time her WBC was 7.6, Hgb 14.2, MCV 91, with Plt 366. On 01/01/2016 the patient had WBC 17.5, Hgb 11.7, MCV 91.4, and Plt 319. This normalized again by 08/05/2017 with WBC 9.0, Hgb 15.0, MCV 91.7, and Plt 352. Most recently Hannah Willis was noted to have WBC 12.3, Hgb 14.4, MCV 94.4, Plt 253.   On exam today Hannah Willis reports that she has had allergies this season but has not had no overt infections.  She reports that she is not having any sinus issues, pneumonias, or urinary tract infections.  She notes that she does periodically take steroids due to problems with her back.  She has not recently taken any steroids and has been over 1 year since her last dosage of steroids.  She reports that she is prone to UC flares but is currently on mesalamine has not had any full-blown issues recently.  She reports that she does have a history of OSA for which she is not currently using a CPAP machine.  On further discussion she notes that she quit smoking approximately 25 years ago after smoking for 7 years.  She reports that she went cold [place].  She reports that  she does not drink any alcohol and previously worked as a Publishing copy in Research scientist (medical).  Her family history is remarkable for type 2 diabetes in her grandfather on her dad side and strokes in her mother.  There is a history of breast cancer in her sister.  She currently denies any fevers, chills, sweats, nausea, vomiting or diarrhea.  Full 10 point ROS is listed below.  MEDICAL HISTORY:  Past Medical History:  Diagnosis Date   Apnea, sleep    no c-pap machine   Arthritis    OA   Basal cell carcinoma    on front chest   Blood in stool    LAST 2 MONTHS, MEDS CHNAGED FOR   GERD (gastroesophageal reflux disease)    Hyperlipidemia    Hypertension    Laryngitis    started monday night   Neuromuscular disorder (HCC)    Post-operative nausea and vomiting    Stroke (HCC) 09/21/2007   Synovitis of right hand 12/29/15   and wrist   Transient global amnesia 2009   Ulcerative colitis (HCC)    Weakness of right hand    due to stroke    SURGICAL HISTORY: Past Surgical History:  Procedure Laterality Date   COLONSCOPY     SHOULDER ARTHROSCOPY W/ ROTATOR CUFF REPAIR Right 2010   SPLENECTOMY, 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   TOTAL KNEE ARTHROPLASTY Left 2001   TOTAL KNEE ARTHROPLASTY Right 12/31/2015   Procedure: RIGHT TOTAL KNEE ARTHROPLASTY;  Surgeon: Durene Romans, MD;  Location: [facility];  Service: Orthopedics;  Laterality: Right;    SOCIAL HISTORY: Social History   Socioeconomic History   Marital status: Married    Spouse name: Not on file   Number of children: 0   Years of education: Not on file   Highest education level: Not on file  Occupational History   Occupation: phone sales  Tobacco Use   Smoking status: Former    Types: Cigarettes    Quit date: 06/20/1989    Years since quitting: 32.4   Smokeless tobacco: Never   Tobacco comments:    smoked occasional cigarettes for 5 years  Vaping Use   Vaping Use: Never used  Substance and Sexual  Activity   Alcohol use: No    Alcohol/week: 0.0 standard drinks   Drug use: No   Sexual activity: Not on file  Other Topics Concern   Not on file  Social History Narrative   Not on file   Social Determinants of Health   Financial Resource Strain: Not on file  Food Insecurity: Not on file  Transportation Needs: Not on file  Physical Activity: Not on file  Stress: Not on file  Social Connections: Not on file  Intimate Partner Violence: Not on file    FAMILY HISTORY: Family History  Problem Relation Age of Onset   Diabetes Father    Mental illness Father    Narcolepsy Father    Heart disease Paternal Grandmother    Diabetes Paternal Grandmother    Heart attack Maternal Grandfather    Stroke Maternal Grandmother    Colon cancer Neg Hx    Esophageal cancer Neg Hx    Rectal cancer Neg Hx    Stomach cancer Neg Hx     ALLERGIES:  is allergic to codeine and morphine and related.  MEDICATIONS:  Current Outpatient Medications  Medication Sig Dispense Refill   acetaminophen (TYLENOL) 325 MG tablet 1 tablet     alendronate (FOSAMAX) 70 MG tablet Take 70 mg by mouth once a week. Take with a full glass of water on an empty stomach.     AMBULATORY NON FORMULARY MEDICATION Panothetic Acid B5 Take 1 tablet by mouth once daily     Ascorbic Acid (VITAMIN C) 500 MG CAPS See admin instructions.     aspirin 325 MG tablet 1 tablet     Cholecalciferol (VITAMIN D PO) Take 1 tablet by mouth daily.     Cyanocobalamin (VITAMIN B-12 PO) Take 1 tablet by mouth daily.     ferrous sulfate 325 (65 FE) MG tablet Take 1 tablet (325 mg total) by mouth 3 (three) times daily after meals. (Patient taking differently: Take 325 mg by mouth daily.)  3   mesalamine (LIALDA) 1.2 g EC tablet TAKE 3 TABLETS (3.6 G TOTAL) BY MOUTH DAILY WITH BREAKFAST. 270 tablet 1   metoprolol succinate (TOPROL-XL) 50 MG 24 hr tablet Take 50 mg by mouth daily. Take with or immediately following a meal.     Misc Natural  Products (OSTEO BI-FLEX TRIPLE STRENGTH) TABS Take 1 tablet by mouth daily.     POTASSIUM PO Take 100 mg by mouth daily. OTC potassium     Pyridoxine HCl (VITAMIN B-6 PO) Take 1 tablet by mouth daily.     rosuvastatin (CRESTOR) 20 MG tablet Take 20 mg by mouth daily.     thiamine (VITAMIN B-1) 100 MG tablet Take 100 mg by mouth daily.     vitamin E 100  UNIT capsule Take by mouth daily.     No current facility-administered medications for this visit.    REVIEW OF SYSTEMS:   Constitutional: ( - ) fevers, ( - )  chills , ( - ) night sweats Eyes: ( - ) blurriness of vision, ( - ) double vision, ( - ) watery eyes Ears, nose, mouth, throat, and face: ( - ) mucositis, ( - ) sore throat Respiratory: ( - ) cough, ( - ) dyspnea, ( - ) wheezes Cardiovascular: ( - ) palpitation, ( - ) chest discomfort, ( - ) lower extremity swelling Gastrointestinal:  ( - ) nausea, ( - ) heartburn, ( - ) change in bowel habits Skin: ( - ) abnormal skin rashes Lymphatics: ( - ) new lymphadenopathy, ( - ) easy bruising Neurological: ( - ) numbness, ( - ) tingling, ( - ) new weaknesses Behavioral/Psych: ( - ) mood change, ( - ) new changes  All other systems were reviewed with the patient and are negative.  PHYSICAL EXAMINATION:  Vitals:   11/04/21 1246  BP: (!) 164/74  Pulse: 70  Resp: 16  Temp: 97.8 F (36.6 C)  SpO2: 95%   Filed Weights   11/04/21 1246  Weight: 153 lb 11.2 oz (69.7 kg)    GENERAL: well appearing elderly Caucasian female in NAD  SKIN: skin color, texture, turgor are normal, no rashes or significant lesions EYES: conjunctiva are pink and non-injected, sclera clear LUNGS: clear to auscultation and percussion with normal breathing effort HEART: regular rate & rhythm and no murmurs and no lower extremity edema Musculoskeletal: no cyanosis of digits and no clubbing  PSYCH: alert & oriented x 3, fluent speech NEURO: no focal motor/sensory deficits  LABORATORY DATA:  I have reviewed the  data as listed    Latest Ref Rng & Units 11/04/2021    1:57 PM 08/05/2017   11:52 AM 01/01/2016    4:38 AM  CBC  WBC 4.0 - 10.5 K/uL 9.1   9.0   17.5    Hemoglobin 12.0 - 15.0 g/dL 62.9   52.8   41.3    Hematocrit 36.0 - 46.0 % 39.7   41.6   33.0    Platelets 150 - 400 K/uL 264   352.0   319         Latest Ref Rng & Units 11/04/2021    1:57 PM 02/18/2021    3:21 PM 08/05/2017   11:52 AM  CMP  Glucose 70 - 99 mg/dL 92   92   244    BUN 8 - 23 mg/dL 12   15   12     Creatinine 0.44 - 1.00 mg/dL 0.10   2.72   5.36    Sodium 135 - 145 mmol/L 141   140   140    Potassium 3.5 - 5.1 mmol/L 4.2   4.3   4.3    Chloride 98 - 111 mmol/L 109   106   103    CO2 22 - 32 mmol/L 27   28   29     Calcium 8.9 - 10.3 mg/dL 9.4   9.2   64.4    Total Protein 6.5 - 8.1 g/dL 6.8      Total Bilirubin 0.3 - 1.2 mg/dL 0.9      Alkaline Phos 38 - 126 U/L 52      AST 15 - 41 U/L 25      ALT 0 - 44 U/L 18  ASSESSMENT & PLAN Hannah Willis 71 y.o. female with medical history significant for sleep apnea (without use of CPAP), HLD, HTN, and ulcerative colitis who presents for evaluation of leukocytosis.   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  # Leukocytosis, Neutrophilic Predominance -- At this time findings are most consistent with a neutrophilic predominant leukocytosis. --Labs today show white blood cell count 9.1, hemoglobin 14.2, MCV of 94.7, and platelets of 264.  Neutrophil predominant at 45% --Patient is not currently compliant with her CPAP.  Noted that leukocytosis would likely persist without routine usage of CPAP --Unclear why white blood cell count normalized today.  Still think that based on the chronicity and prior findings the OSA is the most likely etiology. --To rule out alternative etiologies today we will  order LDH, CRP, and ESR. --No need for routine follow-up in our clinic less the patient to have new or worsening hematological abnormalities.  Orders Placed This Encounter  Procedures   CBC with Differential (Cancer Center Only)    Standing Status:   Future    Number of Occurrences:   1    Standing Expiration Date:   11/05/2022   CMP (Cancer Center only)    Standing Status:   Future    Number of Occurrences:   1    Standing Expiration Date:   11/05/2022   Lactate dehydrogenase (LDH)    Standing Status:   Future    Number of Occurrences:   1    Standing Expiration Date:   11/04/2022   Sedimentation rate    Standing Status:   Future    Number of Occurrences:   1    Standing Expiration Date:   11/04/2022   C-reactive protein    Standing Status:   Future    Number of Occurrences:   1    Standing Expiration Date:   11/04/2022    All questions were answered. The patient knows to call the clinic with any problems, questions or concerns.  A total of more than 60 minutes were spent on this encounter with face-to-face time and non-face-to-face time, including preparing to see the patient, ordering tests and/or medications, counseling the patient and coordination of care as outlined above.   Ulysees Barns, MD Department of Hematology/Oncology [facility] Phone: [phone] Pager: [phone] Email: [email or web address]  11/17/2021 6:50 PM

## 2021-11-11 ENCOUNTER — Telehealth: Payer: Self-pay | Admitting: *Deleted

## 2021-11-11 NOTE — Telephone Encounter (Signed)
Received call from patient regarding her lab results from last week. Reviewed labs with her and advised that they were all normal. WBC normal, no indications of infection or inflammation. ?Pt voiced understanding. Copy of labs left at front desk for pt to pick up tomorrow. ?

## 2021-11-12 DIAGNOSIS — Z1231 Encounter for screening mammogram for malignant neoplasm of breast: Secondary | ICD-10-CM | POA: Diagnosis not present

## 2021-12-31 ENCOUNTER — Telehealth: Payer: Self-pay | Admitting: Gastroenterology

## 2022-01-06 ENCOUNTER — Other Ambulatory Visit: Payer: Medicare Other

## 2022-01-06 ENCOUNTER — Ambulatory Visit: Payer: Medicare Other | Admitting: Hematology and Oncology

## 2022-01-22 ENCOUNTER — Other Ambulatory Visit: Payer: Self-pay | Admitting: Gastroenterology

## 2022-02-14 ENCOUNTER — Other Ambulatory Visit: Payer: Self-pay | Admitting: Gastroenterology

## 2022-02-17 ENCOUNTER — Encounter: Payer: Self-pay | Admitting: Gastroenterology

## 2022-02-17 ENCOUNTER — Ambulatory Visit: Payer: Medicare Other | Admitting: Gastroenterology

## 2022-02-17 VITALS — BP 120/74 | HR 67 | Ht 62.0 in | Wt 153.4 lb

## 2022-02-17 DIAGNOSIS — K51511 Left sided colitis with rectal bleeding: Secondary | ICD-10-CM | POA: Diagnosis not present

## 2022-02-17 MED ORDER — DICYCLOMINE HCL 10 MG PO CAPS: 10.0000 mg | ORAL_CAPSULE | Freq: Three times a day (TID) | ORAL | 1 refills | Status: DC | PRN

## 2022-02-17 MED ORDER — PLENVU 140 G PO SOLR: 1.0000 | ORAL | 0 refills | Status: DC

## 2022-02-17 MED ORDER — OMEPRAZOLE 20 MG PO CPDR: 20.0000 mg | DELAYED_RELEASE_CAPSULE | Freq: Every day | ORAL | 3 refills | Status: DC | PRN

## 2022-02-17 MED ORDER — MESALAMINE 4 G RE ENEM: ENEMA | RECTAL | 1 refills | Status: DC

## 2022-02-17 NOTE — Patient Instructions (Addendum)
You have been scheduled for a colonoscopy. Please follow written instructions given to you at your visit today.  Please pick up your prep supplies at the pharmacy within the next 1-3 days. If you use inhalers (even only as needed), please bring them with you on the day of your procedure. _______________________________________________________  Hannah Willis. _______________________________________________________  We have sent the following medications to your pharmacy for you to pick up at your convenience: Rowasa Enema-1 every night as needed Bentyl 10 mg every 8 hours as needed  _______________________________________________________  If you are age 37 or older, your body mass index should be between 23-30. Your Body mass index is 28.05 kg/m. If this is out of the aforementioned range listed, please consider follow up with your Primary Care Provider.  If you are age 26 or younger, your body mass index should be between 19-25. Your Body mass index is 28.05 kg/m. If this is out of the aformentioned range listed, please consider follow up with your Primary Care Provider.   ________________________________________________________  The [facility] providers would like to encourage you to use [facility] to communicate with providers for non-urgent requests or questions.  Due to long hold times on the telephone, sending your provider a message by [facility] may be a faster and more efficient way to get a response.  Please allow 48 business hours for a response.  Please remember that this is for non-urgent requests.  _______________________________________________________  Due to recent changes in healthcare laws, you may see the results of your imaging and laboratory studies on MyChart before your provider has had a chance to review them.  We understand that in some cases there may be results that are confusing or concerning to you. Not all laboratory results come back in the same time frame and the  provider may be waiting for multiple results in order to interpret others.  Please give [place] 48 hours in order for your provider to thoroughly review all the results before contacting the office for clarification of your results.

## 2022-02-17 NOTE — Progress Notes (Signed)
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 Olevia Perches. She was initially treated with Lialda 2.4gm daily as well as Hydrocortizone enemas which helped her symptoms. She has been maintained with Lialda since that time.  We added Rowasa enemas to use as needed at one point time.   SINCE LAST VISIT   71 year old female here for a follow-up visit for left-sided ulcerative colitis.  Recall she was diagnosed in December 2015 with Dr. Maurene Capes.  She has essentially been on mesalamine monotherapy since that time.  Taking 3 Lialda tabs per day since I have last seen her.  She previously was recommended to use some Rowasa although has not been using that for the past 2 years.  She states at baseline prior to her colitis diagnosis she would have 1 bowel movement per week.  She states lately she has been having 5-8 bowel movements per day.  She states in fact she has had numerous bowel movements per day since her diagnosis of colitis and this is her "new normal".  She has had increased urgency and occasional blood in her stools in recent months, she wonders if from her colitis or not.  She has had some abdominal cramping and lower abdomen discomfort as well.  Otherwise, states she does have some typical food triggers such as chocolates, etc.  She has her gallbladder in place.  Her last colonoscopy was in May 2021 showing some mild left-sided active colitis.  At that point time I had recommended adding Rowasa to her regimen.  No routine NSAID use.  She has a DEXA scan pending in the near future.  She had labs in May which showed no anemia.  Prior colonoscopy exams: Colonoscopy 11/28/19: The perianal and digital rectal examinations were normal. - Multiple small-mouthed diverticula were found in the sigmoid colon and ascending colon. - A small area of granular mucosa with loss of vascularity - mild was found in the rectosigmoid colon in an area of diverticulosis - unclear if  superficial changes from diverticulitis vs. mild active coliis. - Internal hemorrhoids were found during retroflexion. The hemorrhoids were small. - The exam was otherwise without abnormality. No other areas of inflammation appreciated. No polyps. - Biopsies were taken with a cold forceps in the rectum, in the sigmoid colon, in the descending colon and at the splenic flexure for histology.  1. Surgical [P], random left colon bx - FOCAL EROSION WITH MILD INFLAMMATION. - NEGATIVE FOR DYSPLASIA. 2. Surgical [P], colon, rectum/rectosigmoid - MINIMALLY ACTIVE CHRONIC COLITIS. - NEGATIVE FOR DYSPLASIA. - NO GRANULOMAS.   Colonoscopy 09/10/2017 - Preparation of the colon was fair, could not clear the cecal cap. - The examined portion of the ileum was normal. - One 4 mm polyp in the transverse colon, removed with a cold snare. Resected and retrieved. - Diverticulosis in the sigmoid colon. - The examination was otherwise normal on direct and retroflexion views. No active inflammation. - Biopsies for surveillance were taken from the descending colon, sigmoid colon and rectum   Path showed focal active colitis, adenomatous polyp   Colonoscopy December 2015 - left sided colitis, path c/w chronic active colitis    Past Medical History:  Diagnosis Date   Apnea, sleep    no c-pap machine   Arthritis    OA   Basal cell carcinoma    on front chest   Blood in stool    LAST 2 MONTHS, MEDS CHNAGED FOR   GERD (gastroesophageal reflux  disease)    Hyperlipidemia    Hypertension    Laryngitis    started monday night   Neuromuscular disorder ([place])    Post-operative nausea and vomiting    Stroke ([place]) 09/21/2007   Synovitis of right hand 12/29/15   and wrist   Transient global amnesia 2009   Ulcerative colitis ([place])    Weakness of right hand    due to stroke     Past Surgical History:  Procedure Laterality Date   COLONSCOPY     SHOULDER ARTHROSCOPY W/ ROTATOR CUFF REPAIR Right 2010    SPLENECTOMY, 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 at 3 yers old   TOTAL KNEE ARTHROPLASTY Left 2001   TOTAL KNEE ARTHROPLASTY Right 12/31/2015   Procedure: RIGHT TOTAL KNEE ARTHROPLASTY;  Surgeon: Paralee Cancel, MD;  Location: [facility];  Service: Orthopedics;  Laterality: Right;   Family History  Problem Relation Age of Onset   Diabetes Father    Mental illness Father    Narcolepsy Father    Heart disease Paternal Grandmother    Diabetes Paternal Grandmother    Heart attack Maternal Grandfather    Stroke Maternal Grandmother    Colon cancer Neg Hx    Esophageal cancer Neg Hx    Rectal cancer Neg Hx    Stomach cancer Neg Hx    Social History   Tobacco Use   Smoking status: Former    Types: Cigarettes    Quit date: 06/20/1989    Years since quitting: 32.6   Smokeless tobacco: Never   Tobacco comments:    smoked occasional cigarettes for 5 years  Vaping Use   Vaping Use: Never used  Substance Use Topics   Alcohol use: No    Alcohol/week: 0.0 standard drinks of alcohol   Drug use: No   Current Outpatient Medications  Medication Sig Dispense Refill   acetaminophen (TYLENOL) 325 MG tablet 1 tablet     alendronate (FOSAMAX) 70 MG tablet Take 70 mg by mouth once a week. Take with a full glass of water on an empty stomach.     AMBULATORY NON FORMULARY MEDICATION Panothetic Acid B5 Take 1 tablet by mouth once daily     Ascorbic Acid (VITAMIN C) 500 MG CAPS See admin instructions.     aspirin 325 MG tablet 1 tablet     Cholecalciferol (VITAMIN D PO) Take 1 tablet by mouth daily.     Cyanocobalamin (VITAMIN B-12 PO) Take 1 tablet by mouth daily.     diphenhydrAMINE (BENADRYL ALLERGY) 25 mg capsule Take 25 mg by mouth daily.     ferrous sulfate 325 (65 FE) MG tablet Take 1 tablet (325 mg total) by mouth 3 (three) times daily after meals. (Patient taking differently: Take 325 mg by mouth daily.)  3   mesalamine (LIALDA) 1.2 g EC tablet TAKE 3 TABLETS BY MOUTH DAILY WITH BREAKFAST. PLEASE  KEEP YOUR UPCOMING APPOINTMENT FOR REFILLS 90 tablet 1   metoprolol succinate (TOPROL-XL) 50 MG 24 hr tablet Take 50 mg by mouth daily. Take with or immediately following a meal.     Misc Natural Products (OSTEO BI-FLEX TRIPLE STRENGTH) TABS Take 1 tablet by mouth daily.     PANTOTHENIC ACID PO Take 1 tablet by mouth daily.     POTASSIUM PO Take 100 mg by mouth daily. OTC potassium     Pyridoxine HCl (VITAMIN B-6 PO) Take 1 tablet by mouth daily.     rosuvastatin (CRESTOR) 20 MG tablet Take  20 mg by mouth daily.     thiamine (VITAMIN B-1) 100 MG tablet Take 100 mg by mouth daily.     vitamin E 100 UNIT capsule Take by mouth daily.     No current facility-administered medications for this visit.   Allergies  Allergen Reactions   Clindamycin Hcl Other (See Comments)   Codeine Nausea And Vomiting   Morphine And Related     Causes hallucinations      Review of Systems: All systems reviewed and negative except where noted in HPI.   Lab Results  Component Value Date   WBC 9.1 11/04/2021   HGB 14.2 11/04/2021   HCT 39.7 11/04/2021   MCV 94.7 11/04/2021   PLT 264 11/04/2021    Lab Results  Component Value Date   CREATININE 0.65 11/04/2021   BUN 12 11/04/2021   NA 141 11/04/2021   K 4.2 11/04/2021   CL 109 11/04/2021   CO2 27 11/04/2021    Lab Results  Component Value Date   ALT 18 11/04/2021   AST 25 11/04/2021   GGT 19 04/04/2015   ALKPHOS 52 11/04/2021   BILITOT 0.9 11/04/2021     Physical Exam: BP 120/74   Pulse 67   Ht 5' 2"  (1.575 m)   Wt 153 lb 6 oz (69.6 kg)   BMI 28.05 kg/m  Constitutional: Pleasant,well-developed, female in no acute distress. Neurological: Alert and oriented to person place and time. Skin: Skin is warm and dry. No rashes noted. Psychiatric: Normal mood and affect. Behavior is normal.   ASSESSMENT: 71 y.o. female here for assessment of the following  1. Left sided ulcerative colitis with rectal bleeding [facility])    Patient with some  ongoing loose stools with urgency and rectal bleeding.  The question is whether or not this represents symptoms related to her underlying colitis or if she has IBS which is driving this.  She has never really had normal bowel habits since her diagnosis of colitis, and inflammatory burden was minimal on her last exam.  She cannot recall that Rowasa has helped her in the past.  We discussed options moving forward to clarify what is causing her bowel habits, either colonoscopy or we could do fecal calprotectin.  Following discussion of options, she wants to proceed with optical colonoscopy.  We will add Rowasa enemas nightly, and add Bentyl as needed for cramps and urgency to see if this will help her in the interim.  If she has no active inflammation on this exam then we will continue mesalamine therapy and treat for underlying IBS.  However if she has active inflammation despite oral and rectal mesalamine, will need to escalate her colitis therapy and transition her to biologic regimen.  She agrees to the plan   PLAN: - continue oral Lialda - add Rowasa enemas qHS - add Bentyl PRN for cramps / urgency - schedule colonoscopy at the [facility] to assess disease activity of colitis - recommendations on therapy regimen pending this result  Jolly Mango, MD [facility]

## 2022-02-18 ENCOUNTER — Encounter: Payer: Self-pay | Admitting: Gastroenterology

## 2022-02-18 ENCOUNTER — Encounter: Payer: Self-pay | Admitting: Certified Registered Nurse Anesthetist

## 2022-02-25 ENCOUNTER — Ambulatory Visit (AMBULATORY_SURGERY_CENTER): Payer: Medicare Other | Admitting: Gastroenterology

## 2022-02-25 ENCOUNTER — Encounter: Payer: Self-pay | Admitting: Gastroenterology

## 2022-02-25 VITALS — BP 122/61 | HR 82 | Temp 98.9°F | Resp 17 | Ht 62.0 in | Wt 153.0 lb

## 2022-02-25 DIAGNOSIS — K529 Noninfective gastroenteritis and colitis, unspecified: Secondary | ICD-10-CM | POA: Diagnosis not present

## 2022-02-25 DIAGNOSIS — K51511 Left sided colitis with rectal bleeding: Secondary | ICD-10-CM

## 2022-02-25 DIAGNOSIS — K5190 Ulcerative colitis, unspecified, without complications: Secondary | ICD-10-CM | POA: Diagnosis not present

## 2022-02-25 MED ORDER — SODIUM CHLORIDE 0.9 % IV SOLN: 500.0000 mL | Freq: Once | INTRAVENOUS | Status: DC

## 2022-02-25 NOTE — Patient Instructions (Signed)
Thank you for coming in to see [place] today! Resume diet and medications today. Return to normal daily activities tomorrow. Biopsy results will be available in 1-2 weeks.  Dr Armbruster/ Nurse will be in touch to discuss results and recommendations.    YOU HAD AN ENDOSCOPIC PROCEDURE TODAY AT [facility]:   Refer to the procedure report that was given to you for any specific questions about what was found during the examination.  If the procedure report does not answer your questions, please call your gastroenterologist to clarify.  If you requested that your care partner not be given the details of your procedure findings, then the procedure report has been included in a sealed envelope for you to review at your convenience later.  YOU SHOULD EXPECT: Some feelings of bloating in the abdomen. Passage of more gas than usual.  Walking can help get rid of the air that was put into your GI tract during the procedure and reduce the bloating. If you had a lower endoscopy (such as a colonoscopy or flexible sigmoidoscopy) you may notice spotting of blood in your stool or on the toilet paper. If you underwent a bowel prep for your procedure, you may not have a normal bowel movement for a few days.  Please Note:  You might notice some irritation and congestion in your nose or some drainage.  This is from the oxygen used during your procedure.  There is no need for concern and it should clear up in a day or so.  SYMPTOMS TO REPORT IMMEDIATELY:  Following lower endoscopy (colonoscopy or flexible sigmoidoscopy):  Excessive amounts of blood in the stool  Significant tenderness or worsening of abdominal pains  Swelling of the abdomen that is new, acute  Fever of 100F or higher    For urgent or emergent issues, a gastroenterologist can be reached at any hour by calling [phone]. Do not use MyChart messaging for urgent concerns.    DIET:  We do recommend a small meal at first, but then  you may proceed to your regular diet.  Drink plenty of fluids but you should avoid alcoholic beverages for 24 hours.  ACTIVITY:  You should plan to take it easy for the rest of today and you should NOT DRIVE or use heavy machinery until tomorrow (because of the sedation medicines used during the test).    FOLLOW UP: Our staff will call the number listed on your records the next business day following your procedure.  We will call around 7:15- 8:00 am to check on you and address any questions or concerns that you may have regarding the information given to you following your procedure. If we do not reach you, we will leave a message.  If you develop any symptoms (ie: fever, flu-like symptoms, shortness of breath, cough etc.) before then, please call [phone].  If you test positive for Covid 19 in the 2 weeks post procedure, please call and report this information to [place].    If any biopsies were taken you will be contacted by phone or by letter within the next 1-3 weeks.  Please call us at [phone] if you have not heard about the biopsies in 3 weeks.    SIGNATURES/CONFIDENTIALITY: You and/or your care partner have signed paperwork which will be entered into your electronic medical record.  These signatures attest to the fact that that the information above on your After Visit Summary has been reviewed and is understood.  Full responsibility of  the confidentiality of this discharge information lies with you and/or your care-partner.

## 2022-02-25 NOTE — Progress Notes (Signed)
History and Physical Interval Note: see on 02/17/22 - see clinic note for details. No interval changes that are significant. She is now on both Rowasa and Lialda and symptoms are better, she thinks colitis better controlled. Colonoscopy to further evaluate, she wishes to proceed after discussion of options.  02/25/2022 3:57 PM  Hannah Willis  has presented today for endoscopic procedure(s), with the diagnosis of  Encounter Diagnosis  Name Primary?   Left sided ulcerative colitis with rectal bleeding ([place]) Yes  .  The various methods of evaluation and treatment have been discussed with the patient and/or family. After consideration of risks, benefits and other options for treatment, the patient has consented to  the endoscopic procedure(s).   The patient's history has been reviewed, patient examined, no change in status, stable for surgery.  I have reviewed the patient's chart and labs.  Questions were answered to the patient's satisfaction.    Jolly Mango, MD [facility]

## 2022-02-25 NOTE — Progress Notes (Signed)
1613 Ephedrine 10 mg given IV due to low BP, MD updated.

## 2022-02-25 NOTE — Progress Notes (Signed)
Called to room to assist during endoscopic procedure.  Patient ID and intended procedure confirmed with present staff. Received instructions for my participation in the procedure from the performing physician.  

## 2022-02-25 NOTE — Op Note (Signed)
[facility] Patient Name: Hannah Willis Procedure Date: 02/25/2022 3:47 PM MRN: [id number] Endoscopist: Remo Lipps P. Havery Moros , MD Age: 71 Referring MD:  Date of Birth: [birth year] Gender: Female Account #: [id number] Procedure:                Colonoscopy Indications:              Disease activity assessment of left-sided chronic                            ulcerative colitis - previously on Lialda                            monotherapy, had worsening symptoms, have since                            added Rowasa and she is feeling better recently Medicines:                Monitored Anesthesia Care Procedure:                Pre-Anesthesia Assessment:                           - Prior to the procedure, a History and Physical                            was performed, and patient medications and                            allergies were reviewed. The patient's tolerance of                            previous anesthesia was also reviewed. The risks                            and benefits of the procedure and the sedation                            options and risks were discussed with the patient.                            All questions were answered, and informed consent                            was obtained. Prior Anticoagulants: The patient has                            taken no previous anticoagulant or antiplatelet                            agents. ASA Grade Assessment: III - A patient with                            severe systemic disease. After reviewing the risks  and benefits, the patient was deemed in                            satisfactory condition to undergo the procedure.                           After obtaining informed consent, the colonoscope                            was passed under direct vision. Throughout the                            procedure, the patient's blood pressure, pulse, and                            oxygen  saturations were monitored continuously. The                            Olympus PCF-H190DL (#[id number]) Colonoscope was                            introduced through the anus and advanced to the the                            terminal ileum, with identification of the                            appendiceal orifice and IC valve. The colonoscopy                            was performed without difficulty. The patient                            tolerated the procedure well. The quality of the                            bowel preparation was adequate. The terminal ileum,                            ileocecal valve, appendiceal orifice, and rectum                            were photographed. Scope In: 4:07:22 PM Scope Out: 4:24:14 PM Scope Withdrawal Time: 0 hours 13 minutes 34 seconds  Total Procedure Duration: 0 hours 16 minutes 52 seconds  Findings:                 The perianal and digital rectal examinations were                            normal.                           The terminal ileum appeared normal.  Multiple small-mouthed diverticula were found in                            the sigmoid colon.                           Areas of mildly erythematous mucosa was found in                            the recto-sigmoid colon and in the distal sigmoid                            colon near areas of diverticulum. Unclear if this                            represents segmental / diverticular related colitis                            more so than ulcerative colitis? Biopsies were                            taken with a cold forceps for histology.                           The exam was otherwise without abnormality.                           Biopsies were taken with a cold forceps in the                            rectum for histology. Complications:            No immediate complications. Estimated blood loss:                            Minimal. Estimated Blood Loss:      Estimated blood loss was minimal. Impression:               - The examined portion of the ileum was normal.                           - Diverticulosis in the sigmoid colon.                           - Erythematous mucosa in the recto-sigmoid colon                            and in the distal sigmoid colon near diverticular                            disease - question possible diverticular /                            segmental colitis vs. ulcerative colitis. Biopsied.                           -  The examination was otherwise normal.                           - Biopsies were taken with a cold forceps for                            histology in the rectum. Recommendation:           - Patient has a contact number available for                            emergencies. The signs and symptoms of potential                            delayed complications were discussed with the                            patient. Return to normal activities tomorrow.                            Written discharge instructions were provided to the                            patient.                           - Resume previous diet.                           - Continue present medications - continue Rowasa                            and Lialda for now if improved, while biopsies                            pending.                           - Await pathology results with further                            recommendations Remo Lipps P. Havery Moros, MD 02/25/2022 4:35:56 PM This report has been signed electronically.

## 2022-02-25 NOTE — Progress Notes (Signed)
Report given to PACU, vss 

## 2022-02-26 ENCOUNTER — Telehealth: Payer: Self-pay | Admitting: *Deleted

## 2022-02-26 NOTE — Telephone Encounter (Signed)
  Follow up Call-     02/25/2022    2:40 PM 11/28/2019    1:48 PM  Call back number  Post procedure Call Back phone  # [phone]  Permission to leave phone message Yes Yes     Patient questions: VM box has not been set up.

## 2022-03-05 ENCOUNTER — Other Ambulatory Visit: Payer: Self-pay

## 2022-03-05 DIAGNOSIS — K51511 Left sided colitis with rectal bleeding: Secondary | ICD-10-CM

## 2022-03-05 MED ORDER — MESALAMINE 4 G RE ENEM: 4.0000 g | ENEMA | Freq: Every day | RECTAL | 3 refills | Status: DC

## 2022-03-11 ENCOUNTER — Ambulatory Visit
Admission: RE | Admit: 2022-03-11 | Discharge: 2022-03-11 | Disposition: A | Discharge status: Home / Self Care | Payer: Medicare Other | Source: Ambulatory Visit | Attending: Family Medicine | Admitting: Family Medicine

## 2022-03-11 DIAGNOSIS — M810 Age-related osteoporosis without current pathological fracture: Secondary | ICD-10-CM

## 2022-03-11 DIAGNOSIS — M85851 Other specified disorders of bone density and structure, right thigh: Secondary | ICD-10-CM | POA: Diagnosis not present

## 2022-03-11 DIAGNOSIS — E2839 Other primary ovarian failure: Secondary | ICD-10-CM

## 2022-03-11 DIAGNOSIS — Z780 Asymptomatic menopausal state: Secondary | ICD-10-CM | POA: Diagnosis not present

## 2022-03-12 ENCOUNTER — Other Ambulatory Visit: Payer: Self-pay | Admitting: Gastroenterology

## 2022-03-12 ENCOUNTER — Other Ambulatory Visit: Payer: Medicare Other

## 2022-03-12 DIAGNOSIS — K51511 Left sided colitis with rectal bleeding: Secondary | ICD-10-CM | POA: Diagnosis not present

## 2022-03-16 LAB — CALPROTECTIN, FECAL: Calprotectin, Fecal: 308 ug/g — ABNORMAL HIGH (ref 0–120)

## 2022-03-18 ENCOUNTER — Telehealth: Payer: Self-pay | Admitting: Gastroenterology

## 2022-03-18 NOTE — Telephone Encounter (Signed)
Returned call to patient. See 03/12/22 fecal calprotectin stool study result note for details.

## 2022-03-18 NOTE — Telephone Encounter (Signed)
Patient returned your call. Requesting a call back or leave a vm. Please call to advise.

## 2022-03-19 DIAGNOSIS — H2513 Age-related nuclear cataract, bilateral: Secondary | ICD-10-CM | POA: Diagnosis not present

## 2022-03-19 DIAGNOSIS — H31002 Unspecified chorioretinal scars, left eye: Secondary | ICD-10-CM | POA: Diagnosis not present

## 2022-03-19 DIAGNOSIS — H52203 Unspecified astigmatism, bilateral: Secondary | ICD-10-CM | POA: Diagnosis not present

## 2022-03-27 DIAGNOSIS — E7800 Pure hypercholesterolemia, unspecified: Secondary | ICD-10-CM | POA: Diagnosis not present

## 2022-03-27 DIAGNOSIS — I679 Cerebrovascular disease, unspecified: Secondary | ICD-10-CM | POA: Diagnosis not present

## 2022-03-27 DIAGNOSIS — Z9081 Acquired absence of spleen: Secondary | ICD-10-CM | POA: Diagnosis not present

## 2022-03-27 DIAGNOSIS — I10 Essential (primary) hypertension: Secondary | ICD-10-CM | POA: Diagnosis not present

## 2022-03-27 DIAGNOSIS — Z23 Encounter for immunization: Secondary | ICD-10-CM | POA: Diagnosis not present

## 2022-03-28 ENCOUNTER — Telehealth: Payer: Self-pay | Admitting: Gastroenterology

## 2022-03-28 DIAGNOSIS — K51319 Ulcerative (chronic) rectosigmoiditis with unspecified complications: Secondary | ICD-10-CM

## 2022-03-28 DIAGNOSIS — R1032 Left lower quadrant pain: Secondary | ICD-10-CM

## 2022-03-28 DIAGNOSIS — K5732 Diverticulitis of large intestine without perforation or abscess without bleeding: Secondary | ICD-10-CM

## 2022-03-28 MED ORDER — CIPROFLOXACIN HCL 500 MG PO TABS: 500.0000 mg | ORAL_TABLET | Freq: Two times a day (BID) | ORAL | 0 refills | Status: DC

## 2022-03-28 MED ORDER — ONDANSETRON 8 MG PO TBDP: 8.0000 mg | ORAL_TABLET | Freq: Three times a day (TID) | ORAL | 0 refills | Status: DC | PRN

## 2022-03-28 MED ORDER — METRONIDAZOLE 500 MG PO TABS: 500.0000 mg | ORAL_TABLET | Freq: Two times a day (BID) | ORAL | 0 refills | Status: DC

## 2022-03-28 NOTE — Telephone Encounter (Signed)
Called by patient and husband this evening. She has developed a more significant left lower quadrant abdominal pain that is sharp and stabbing over the course of the last few hours.  She has had intermittent episodes of lower abdominal pain but nothing to the extent of this. She is denies any fevers, though she has not been taking her temperature. No alteration of her bowel habits or blood in her stools at this point. She has never had a true diverticulitis attack. She has known active left-sided colitis and is currently on oral therapy as well as topical therapy with enemas which she continues to use. She is able to maintain fluid hydration currently but she has developed some nausea over the last few hours and is worried that things may progress or worsen. I think it is certainly possible that the patient could have underlying diverticulitis based on the presentation that is being described at this time.  However, without being able to evaluate her in person it could be that this is some sort of other process such as a viral or infectious issue. We will go ahead and treat the patient for presumed diverticulitis with ciprofloxacin and Flagyl and also have antiemetic therapy (Zofran) available for the patient. The patient and husband understand that if things progress even on antibiotics, they can feel free to reach back out to [place] but we would likely refer them to the emergency department at that point for additional work-up/management. I will have Dr. Doyne Keel team reach out to the patient on Monday to see how she is done and consider whether there may be a role for cross-sectional imaging versus continued treatment. Plan of action will be the following: Ciprofloxacin 500 mg twice daily x10 days Metronidazole 500 mg twice daily x10 days Zofran 8 mg ODT every 8 hours as needed nausea Tylenol up to 3000 mg use per day okay Stay hydrated is much as possible If patient progresses or worsens they  will need to go to the emergency department. I am sending the prescriptions to [facility] in [place].   Justice Britain, MD [facility] Office # [phone]

## 2022-03-30 NOTE — Telephone Encounter (Signed)
Thanks for your help with this case Gabe

## 2022-03-31 NOTE — Telephone Encounter (Signed)
Sorry to hear this, just seeing this message now. Hopefully with more time she gets better with antibiotics. [place] would you mind calling back to check on her tomorrow Tuesday to see how she is doing. If she continues to feel poorly with pain she may need a CT abdomen / pelvis with contrast to assess for diverticulitis. If fevers, severe pain, poor PO intake in the interim, her other option would be to go to the ED. If she does ger a CT scan in the next few days, please keep this on your radar to have it reviewed by the [facility] as I will be out of the office the rest of the week after Tuesday. Thanks

## 2022-03-31 NOTE — Telephone Encounter (Signed)
Called and spoke with patient. She states that she is having a "better morning today". Symptoms have improved very little. Pt reports that on Friday evening she had dry heaves. She started Cipro and Flagyl on Saturday. Sunday she states that the pain returned, left sided pain from her pelvis to her lower back. Pt states that the pain worsens at night. Pt has been using lying on her back with a heating pad since that is more comfortable for her. Pt has not been doing any strenuous activity. Pt tried taking Tylenol but it doesn't help much so she has been taking Excedrin extra-strength 1 tablet every 4-6 hours. Pt states that she is not supposed to take Excedrin. Pt states that she ate 1 meal yesterday, she is scared to eat because she is afraid that she will vomit. Pt is tolerating liquids fine. She is aware that she has only been on Cipro and Flagyl for about 2 days and it will likely take more time for her to see improvement in her symptoms. I told pt that I will update you. She is aware that you are out of the office today and will return tomorrow.  Pt knows that I will contact her with any additional recommendations.

## 2022-04-01 NOTE — Telephone Encounter (Signed)
Thank you Weston I appreciate the follow up

## 2022-04-01 NOTE — Telephone Encounter (Signed)
Called and spoke with patient. She states that she is feeling "a little better", she states that the pain is only bothersome at night. The pain has improved some but not completely resolved. Pt would like to continue antibiotics and monitor symptoms right now. She knows to contact us if her symptoms worsen or do not improve and we will order CT scan for her. We reviewed symptoms that will require ED evaluation. Pt verbalized understanding and had no concerns at the end of the call.

## 2022-04-02 ENCOUNTER — Other Ambulatory Visit: Payer: Self-pay

## 2022-04-02 ENCOUNTER — Encounter (HOSPITAL_BASED_OUTPATIENT_CLINIC_OR_DEPARTMENT_OTHER): Payer: Self-pay | Admitting: Emergency Medicine

## 2022-04-02 ENCOUNTER — Emergency Department (HOSPITAL_BASED_OUTPATIENT_CLINIC_OR_DEPARTMENT_OTHER)
Admission: EM | Admit: 2022-04-02 | Discharge: 2022-04-02 | Disposition: A | Discharge status: Home / Self Care | Payer: Medicare Other | Attending: Emergency Medicine | Admitting: Emergency Medicine

## 2022-04-02 ENCOUNTER — Emergency Department (HOSPITAL_BASED_OUTPATIENT_CLINIC_OR_DEPARTMENT_OTHER): Payer: Medicare Other

## 2022-04-02 DIAGNOSIS — Q6211 Congenital occlusion of ureteropelvic junction: Secondary | ICD-10-CM

## 2022-04-02 DIAGNOSIS — N130 Hydronephrosis with ureteropelvic junction obstruction: Secondary | ICD-10-CM | POA: Insufficient documentation

## 2022-04-02 DIAGNOSIS — R1032 Left lower quadrant pain: Secondary | ICD-10-CM

## 2022-04-02 DIAGNOSIS — Z7982 Long term (current) use of aspirin: Secondary | ICD-10-CM | POA: Insufficient documentation

## 2022-04-02 DIAGNOSIS — N201 Calculus of ureter: Secondary | ICD-10-CM

## 2022-04-02 DIAGNOSIS — N134 Hydroureter: Secondary | ICD-10-CM | POA: Diagnosis not present

## 2022-04-02 DIAGNOSIS — I700 Atherosclerosis of aorta: Secondary | ICD-10-CM | POA: Diagnosis not present

## 2022-04-02 LAB — URINALYSIS, ROUTINE W REFLEX MICROSCOPIC
Bilirubin Urine: NEGATIVE
Glucose, UA: NEGATIVE mg/dL
Ketones, ur: 15 mg/dL — AB
Nitrite: NEGATIVE
Specific Gravity, Urine: 1.028 (ref 1.005–1.030)
pH: 5.5 (ref 5.0–8.0)

## 2022-04-02 LAB — CBC WITH DIFFERENTIAL/PLATELET
Abs Immature Granulocytes: 0.1 10*3/uL — ABNORMAL HIGH (ref 0.00–0.07)
Basophils Absolute: 0.1 10*3/uL (ref 0.0–0.1)
Basophils Relative: 1 %
Eosinophils Absolute: 0 10*3/uL (ref 0.0–0.5)
Eosinophils Relative: 0 %
HCT: 41.1 % (ref 36.0–46.0)
Hemoglobin: 14.7 g/dL (ref 12.0–15.0)
Immature Granulocytes: 1 %
Lymphocytes Relative: 9 %
Lymphs Abs: 1.5 10*3/uL (ref 0.7–4.0)
MCH: 32.7 pg (ref 26.0–34.0)
MCHC: 35.8 g/dL (ref 30.0–36.0)
MCV: 91.5 fL (ref 80.0–100.0)
Monocytes Absolute: 2.1 10*3/uL — ABNORMAL HIGH (ref 0.1–1.0)
Monocytes Relative: 13 %
Neutro Abs: 12.1 10*3/uL — ABNORMAL HIGH (ref 1.7–7.7)
Neutrophils Relative %: 76 %
Platelets: 307 10*3/uL (ref 150–400)
RBC: 4.49 MIL/uL (ref 3.87–5.11)
RDW: 13 % (ref 11.5–15.5)
WBC: 15.8 10*3/uL — ABNORMAL HIGH (ref 4.0–10.5)
nRBC: 0 % (ref 0.0–0.2)

## 2022-04-02 LAB — COMPREHENSIVE METABOLIC PANEL
ALT: 14 U/L (ref 0–44)
AST: 23 U/L (ref 15–41)
Albumin: 4.1 g/dL (ref 3.5–5.0)
Alkaline Phosphatase: 56 U/L (ref 38–126)
Anion gap: 12 (ref 5–15)
BUN: 14 mg/dL (ref 8–23)
CO2: 22 mmol/L (ref 22–32)
Calcium: 9.4 mg/dL (ref 8.9–10.3)
Chloride: 101 mmol/L (ref 98–111)
Creatinine, Ser: 0.75 mg/dL (ref 0.44–1.00)
GFR, Estimated: >60 mL/min (ref >60)
Glucose, Bld: 118 mg/dL — ABNORMAL HIGH (ref 70–99)
Potassium: 3.9 mmol/L (ref 3.5–5.1)
Sodium: 135 mmol/L (ref 135–145)
Total Bilirubin: 0.8 mg/dL (ref 0.3–1.2)
Total Protein: 6.9 g/dL (ref 6.5–8.1)

## 2022-04-02 LAB — LIPASE, BLOOD: Lipase: <10 U/L — ABNORMAL LOW (ref 11–51)

## 2022-04-02 MED ORDER — TAMSULOSIN HCL 0.4 MG PO CAPS: 0.4000 mg | ORAL_CAPSULE | Freq: Every day | ORAL | 0 refills | Status: DC

## 2022-04-02 MED ORDER — KETOROLAC TROMETHAMINE 30 MG/ML IJ SOLN
30.0000 mg | Freq: Once | INTRAMUSCULAR | Status: AC
  Administered 2022-04-02: 30 mg via INTRAVENOUS
  Filled 2022-04-02: qty 1

## 2022-04-02 MED ORDER — IOHEXOL 300 MG/ML  SOLN
80.0000 mL | Freq: Once | INTRAMUSCULAR | Status: AC | PRN
  Administered 2022-04-02: 80 mL via INTRAVENOUS

## 2022-04-02 MED ORDER — FENTANYL CITRATE PF 50 MCG/ML IJ SOSY
25.0000 ug | PREFILLED_SYRINGE | Freq: Once | INTRAMUSCULAR | Status: AC
  Administered 2022-04-02: 25 ug via INTRAVENOUS
  Filled 2022-04-02: qty 1

## 2022-04-02 MED ORDER — TRAMADOL HCL 50 MG PO TABS: 50.0000 mg | ORAL_TABLET | Freq: Four times a day (QID) | ORAL | 0 refills | Status: DC | PRN

## 2022-04-02 MED ORDER — MORPHINE SULFATE (PF) 4 MG/ML IV SOLN
4.0000 mg | Freq: Once | INTRAVENOUS | Status: DC
  Filled 2022-04-02: qty 1

## 2022-04-02 MED ORDER — ONDANSETRON HCL 4 MG/2ML IJ SOLN
4.0000 mg | Freq: Once | INTRAMUSCULAR | Status: AC
  Administered 2022-04-02: 4 mg via INTRAVENOUS
  Filled 2022-04-02: qty 2

## 2022-04-02 NOTE — Telephone Encounter (Signed)
Patient stated that she would like to follow through with doing CT scan. Patient is requesting a call back to discuss. Please advise.

## 2022-04-02 NOTE — Telephone Encounter (Signed)
Pt reports she is not feeling better and the pain is moving around. Pt will go to the ER to be evaluated and possibly have ct scan done.

## 2022-04-02 NOTE — ED Triage Notes (Signed)
Pt arrived POV, ambulatory. Pt caox4. Pt reports she had flu vaccine Friday and it started shortly after that. Pt c/o L flank pain radiating from the L lower abdomen and L lower back since Friday. Pt states she also had some nausea and dry heaving. Pt reports painful urination, increased frequency over the weekend.

## 2022-04-02 NOTE — Discharge Instructions (Signed)
Your CT scan today showed a kidney stone.  This is likely the cause of your pain.  Every medication called Flomax which will help to dilate your ureters.  Please use caution as this does dilate other blood vessels.  Make sure to dangle your feet on the edge of the bed before standing up quickly.  I have written you for a short course of pain medicine called tramadol.  You should take the nausea medicine Zofran that you have at home with this due to your history of nausea with pain medicine.  Please follow-up with urology.  Their contact information is listed in your discharge paperwork.  Call tomorrow to schedule an appointment.  You may stop taking the Flagyl as well as the Bentyl medication prescribed to you by the gastroenterologist  Return for new or worsening symptoms

## 2022-04-02 NOTE — ED Provider Notes (Signed)
[facility] Provider Note   CSN: [id number] Arrival date & time: 04/02/22  1302    History  LLQ abd pain   Hannah Willis is a 71 y.o. female history of left-sided ulcerative colitis followed by Dr. Havery Moros, previous CVA, here for evaluation of left lower quadrant abdominal pain.  Symptoms have been ongoing over the last week and a half.  She was seen by GI and started on Cipro Flagyl for possible diverticulitis.  Had not had a CT scan at that time.  Patient denies any diarrhea, constipation or blood in stool.  Did states that she had some left flank pain which began this weekend, wrapped around her left lower abdomen and lower back.  Has had some nausea without vomiting.  Does admit to increased urinary frequency over the weekend.  She has been compliant with her Cipro and Flagyl prescribed her by urology.  No prior history of kidney stones.  No recent falls or injuries.  No numbness or weakness.  HPI     Home Medications Prior to Admission medications   Medication Sig Start Date End Date Taking? Authorizing Provider  tamsulosin (FLOMAX) 0.4 MG CAPS capsule Take 1 capsule (0.4 mg total) by mouth daily. 04/02/22  Yes Ralphine Hinks A, PA-C  traMADol (ULTRAM) 50 MG tablet Take 1 tablet (50 mg total) by mouth every 6 (six) hours as needed. 04/02/22  Yes Junior Kenedy A, PA-C  acetaminophen (TYLENOL) 325 MG tablet 1 tablet    [provider]  alendronate (FOSAMAX) 70 MG tablet Take 70 mg by mouth once a week. Take with a full glass of water on an empty stomach.    [provider]  AMBULATORY NON FORMULARY MEDICATION Panothetic Acid B5 Take 1 tablet by mouth once daily    [provider]  Ascorbic Acid (VITAMIN C) 500 MG CAPS See admin instructions.    [provider]  aspirin 325 MG tablet 1 tablet    [provider]  Cholecalciferol (VITAMIN D PO) Take 1 tablet by mouth daily.    [provider]   ciprofloxacin (CIPRO) 500 MG tablet Take 1 tablet (500 mg total) by mouth 2 (two) times daily. 03/28/22   Mansouraty, Telford Nab., MD  Cyanocobalamin (VITAMIN B-12 PO) Take 1 tablet by mouth daily.    [provider]  dicyclomine (BENTYL) 10 MG capsule Take 1 capsule (10 mg total) by mouth every 8 (eight) hours as needed for spasms. 02/17/22   Armbruster, Carlota Raspberry, MD  diphenhydrAMINE (BENADRYL ALLERGY) 25 mg capsule Take 25 mg by mouth daily.    [provider]  ferrous sulfate 325 (65 FE) MG tablet Take 1 tablet (325 mg total) by mouth 3 (three) times daily after meals. Patient taking differently: Take 325 mg by mouth daily. 01/01/16   Danae Orleans, PA-C  mesalamine (LIALDA) 1.2 g EC tablet TAKE 3 TABLETS BY MOUTH DAILY WITH BREAKFAST. PLEASE KEEP YOUR UPCOMING APPOINTMENT FOR REFILLS 03/12/22   Armbruster, Carlota Raspberry, MD  mesalamine (ROWASA) 4 g enema INSERT 1 ENEMA PER RECTUM EVERY NIGHT AS NEEDED 03/12/22   Armbruster, Carlota Raspberry, MD  metoprolol succinate (TOPROL-XL) 50 MG 24 hr tablet Take 50 mg by mouth daily. Take with or immediately following a meal.    [provider]  metroNIDAZOLE (FLAGYL) 500 MG tablet Take 1 tablet (500 mg total) by mouth 2 (two) times daily. 03/28/22   Mansouraty, Telford Nab., MD  Misc Natural Products (OSTEO BI-FLEX TRIPLE STRENGTH) TABS  Take 1 tablet by mouth daily.    [provider]  ondansetron (ZOFRAN-ODT) 8 MG disintegrating tablet Take 1 tablet (8 mg total) by mouth every 8 (eight) hours as needed for nausea or vomiting. 03/28/22   Mansouraty, Telford Nab., MD  PANTOTHENIC ACID PO Take 1 tablet by mouth daily.    [provider]  POTASSIUM PO Take 100 mg by mouth daily. OTC potassium    [provider]  rosuvastatin (CRESTOR) 20 MG tablet Take 20 mg by mouth daily.    [provider]  thiamine (VITAMIN B-1) 100 MG tablet Take 100 mg by mouth daily.    [provider]  vitamin E 100 UNIT capsule Take  by mouth daily.    [provider]      Allergies    Clindamycin hcl, Codeine, and Morphine and related    Review of Systems   Review of Systems  Constitutional: Negative.   HENT: Negative.    Respiratory: Negative.    Cardiovascular: Negative.   Gastrointestinal:  Positive for abdominal pain and nausea. Negative for abdominal distention, anal bleeding, blood in stool, constipation, diarrhea, rectal pain and vomiting.  Genitourinary:  Positive for dysuria, flank pain and frequency. Negative for decreased urine volume, difficulty urinating, genital sores, hematuria, menstrual problem, pelvic pain, urgency, vaginal bleeding, vaginal discharge and vaginal pain.  Skin: Negative.   Neurological: Negative.   All other systems reviewed and are negative.   Physical Exam Updated Vital Signs BP 138/60   Pulse 73   Temp 98.1 F (36.7 C) (Oral)   Resp 18   Ht 5' 2"  (1.575 m)   Wt 67.9 kg   SpO2 95%   BMI 27.40 kg/m  Physical Exam Vitals and nursing note reviewed.  Constitutional:      General: She is not in acute distress.    Appearance: She is well-developed. She is not ill-appearing, toxic-appearing or diaphoretic.  HENT:     Head: Normocephalic and atraumatic.     Nose: Nose normal.     Mouth/Throat:     Mouth: Mucous membranes are moist.  Eyes:     Pupils: Pupils are equal, round, and reactive to light.  Cardiovascular:     Rate and Rhythm: Normal rate.     Pulses: Normal pulses.     Heart sounds: Normal heart sounds.  Pulmonary:     Effort: Pulmonary effort is normal. No respiratory distress.     Breath sounds: Normal breath sounds.  Abdominal:     General: Bowel sounds are normal. There is no distension.     Palpations: Abdomen is soft.     Tenderness: There is abdominal tenderness. There is no right CVA tenderness, left CVA tenderness or guarding.     Comments: Llq tenderness  Musculoskeletal:        General: No swelling, tenderness, deformity or signs of  injury. Normal range of motion.     Cervical back: Normal range of motion.     Right lower leg: No edema.     Left lower leg: No edema.  Skin:    General: Skin is warm and dry.     Capillary Refill: Capillary refill takes less than 2 seconds.  Neurological:     General: No focal deficit present.     Mental Status: She is alert.  Psychiatric:        Mood and Affect: Mood normal.    ED Results / Procedures / Treatments   Labs (all labs ordered  are listed, but only abnormal results are displayed) Labs Reviewed  URINALYSIS, ROUTINE W REFLEX MICROSCOPIC - Abnormal; Notable for the following components:      Result Value   Hgb urine dipstick TRACE (*)    Ketones, ur 15 (*)    Protein, ur TRACE (*)    Leukocytes,Ua TRACE (*)    All other components within normal limits  CBC WITH DIFFERENTIAL/PLATELET - Abnormal; Notable for the following components:   WBC 15.8 (*)    Neutro Abs 12.1 (*)    Monocytes Absolute 2.1 (*)    Abs Immature Granulocytes 0.10 (*)    All other components within normal limits  COMPREHENSIVE METABOLIC PANEL - Abnormal; Notable for the following components:   Glucose, Bld 118 (*)    All other components within normal limits  LIPASE, BLOOD - Abnormal; Notable for the following components:   Lipase <10 (*)    All other components within normal limits  URINE CULTURE    EKG None  Radiology CT ABDOMEN PELVIS W CONTRAST  Result Date: 04/02/2022 CLINICAL DATA:  Left lower quadrant abdominal pain EXAM: CT ABDOMEN AND PELVIS WITH CONTRAST TECHNIQUE: Multidetector CT imaging of the abdomen and pelvis was performed using the standard protocol following bolus administration of intravenous contrast. RADIATION DOSE REDUCTION: This exam was performed according to the departmental dose-optimization program which includes automated exposure control, adjustment of the mA and/or kV according to patient size and/or use of iterative reconstruction technique. CONTRAST:  67m  OMNIPAQUE IOHEXOL 300 MG/ML  SOLN COMPARISON:  None Available. FINDINGS: Lower chest: Cardiomegaly.  No pericardial effusion. Hepatobiliary: No focal liver abnormality is seen. No gallstones, gallbladder wall thickening, or biliary dilatation. Pancreas: Unremarkable. No pancreatic ductal dilatation or surrounding inflammatory changes. Spleen: Absent. Adrenals/Urinary Tract: Adrenal glands and right kidney are unremarkable. Delayed left nephrogram. Nonobstructing 5 mm stone in the lower pole of the left kidney. Moderate left hydroureteronephrosis upstream from a 6 mm stone near the left ureterovesical junction. Left perinephric stranding and mild urothelial hyperenhancement of the left ureter. Stomach/Bowel: Stomach is unremarkable. Normal caliber large and small bowel. Colonic diverticulosis without diverticulitis. Vascular/Lymphatic: Aortic atherosclerosis. No enlarged abdominal or pelvic lymph nodes. Reproductive: Unremarkable. Other: No free intraperitoneal air. Musculoskeletal: Advanced thoracolumbar spondylosis. No acute osseous abnormality. IMPRESSION: Obstructing 6 mm stone near the left UVJ with upstream hydroureteronephrosis and left pyelonephritis. Colonic diverticulosis without diverticulitis. Aortic Atherosclerosis (ICD10-I70.0). Electronically Signed   By: TPlacido SouM.D.   On: 04/02/2022 19:45    Procedures Procedures    Medications Ordered in ED Medications  ondansetron (ZOFRAN) injection 4 mg (4 mg Intravenous Given 04/02/22 1805)  fentaNYL (SUBLIMAZE) injection 25 mcg (25 mcg Intravenous Given 04/02/22 1819)  iohexol (OMNIPAQUE) 300 MG/ML solution 80 mL (80 mLs Intravenous Contrast Given 04/02/22 1925)  ketorolac (TORADOL) 30 MG/ML injection 30 mg (30 mg Intravenous Given 04/02/22 2000)    ED Course/ Medical Decision Making/ A&P    71year old history of ulcerative colitis here for evaluation left lower abdominal pain.  Has been ongoing over the last week or so.  Initially  contacted Dr. AHavery Morosher gastroenterologist who started her on Cipro Flagyl for presumed diverticulitis.  She denies any changes in her bowel movements, bloody stool.  On Friday developed some flank pain which wraps on her lower abdomen on the left as well as some dysuria and urinary frequency.  No history of kidney stones.  No chest pain or shortness of breath.  Some nausea that vomiting.  Labs and imaging  personally viewed and interpreted:  CBC leukocytosis 31.5 Metabolic panel glucose 176 Lipase less than 10 UA trace leuks, nitrite negative will send for culture CT abdomen and pelvis with 6 mm left UVJ stone with hydronephrosis and pyelonephritis  Discussed results with patient.  She has had good pain control with fentanyl.  We will give her some Toradol.  Given radiology remarking about pyelonephritis will touch base with urology.  CONSULT with Dr. Louis Meckel with Urology who rec culture, urine pain management. Can FU outpatient.    I feel this is reasonable.  Low suspicion for infected stone at this time.  Reassessed patient.  Pain significantly improved after Toradol.  Currently asymptomatic.  I discussed her labs and imaging.  Will DC her home, have close follow-up outpatient with [facility].  She is agreeable.  She ready has prescriptions for Zofran at home.  DC home with symptomatic management for kidney stone.  No indication of appendicitis, bowel obstruction, bowel perforation, cholecystitis, diverticulitis, infected stone.  The patient has been appropriately medically screened and/or stabilized in the ED. I have low suspicion for any other emergent medical condition which would require further screening, evaluation or treatment in the ED or require inpatient management.  Patient is hemodynamically stable and in no acute distress.  Patient able to ambulate in department prior to ED.  Evaluation does not show acute pathology that would require ongoing or additional emergent  interventions while in the emergency department or further inpatient treatment.  I have discussed the diagnosis with the patient and answered all questions.  Pain is been managed while in the emergency department and patient has no further complaints prior to discharge.  Patient is comfortable with plan discussed in room and is stable for discharge at this time.  I have discussed strict return precautions for returning to the emergency department.  Patient was encouraged to follow-up with PCP/specialist refer to at discharge.                           Medical Decision Making Amount and/or Complexity of Data Reviewed Independent Historian: spouse External Data Reviewed: labs, radiology and notes. Labs: ordered. Decision-making details documented in ED Course. Radiology: ordered and independent interpretation performed. Decision-making details documented in ED Course.  Risk OTC drugs. Prescription drug management. Parenteral controlled substances. Decision regarding hospitalization. Diagnosis or treatment significantly limited by social determinants of health.         Final Clinical Impression(s) / ED Diagnoses Final diagnoses:  Obstruction of left ureteropelvic junction (UPJ) due to stone  Hydronephrosis with ureteropelvic junction (UPJ) obstruction    Rx / DC Orders ED Discharge Orders          Ordered    traMADol (ULTRAM) 50 MG tablet  Every 6 hours PRN        04/02/22 2045    tamsulosin (FLOMAX) 0.4 MG CAPS capsule  Daily        04/02/22 2045              Mertis Mosher A, PA-C 04/02/22 2050    Varney Biles, MD 04/03/22 1643

## 2022-04-03 ENCOUNTER — Other Ambulatory Visit: Payer: Self-pay | Admitting: Gastroenterology

## 2022-04-03 LAB — URINE CULTURE: Culture: NO GROWTH

## 2022-04-04 DIAGNOSIS — N201 Calculus of ureter: Secondary | ICD-10-CM | POA: Diagnosis not present

## 2022-04-08 NOTE — ED Notes (Signed)
1 mg Morphine wasted with Chanin, RN. Pt refused medication.

## 2022-04-15 DIAGNOSIS — N132 Hydronephrosis with renal and ureteral calculous obstruction: Secondary | ICD-10-CM | POA: Diagnosis not present

## 2022-04-15 DIAGNOSIS — N201 Calculus of ureter: Secondary | ICD-10-CM | POA: Diagnosis not present

## 2022-05-21 DIAGNOSIS — N202 Calculus of kidney with calculus of ureter: Secondary | ICD-10-CM | POA: Diagnosis not present

## 2022-05-21 DIAGNOSIS — N200 Calculus of kidney: Secondary | ICD-10-CM | POA: Diagnosis not present

## 2022-06-03 DIAGNOSIS — N200 Calculus of kidney: Secondary | ICD-10-CM | POA: Diagnosis not present

## 2022-06-06 ENCOUNTER — Encounter: Payer: Self-pay | Admitting: Gastroenterology

## 2022-06-06 ENCOUNTER — Other Ambulatory Visit (INDEPENDENT_AMBULATORY_CARE_PROVIDER_SITE_OTHER): Payer: Medicare Other

## 2022-06-06 ENCOUNTER — Ambulatory Visit (INDEPENDENT_AMBULATORY_CARE_PROVIDER_SITE_OTHER): Payer: Medicare Other | Admitting: Gastroenterology

## 2022-06-06 VITALS — BP 124/74 | HR 56 | Ht 62.0 in | Wt 147.0 lb

## 2022-06-06 DIAGNOSIS — R5383 Other fatigue: Secondary | ICD-10-CM | POA: Diagnosis not present

## 2022-06-06 DIAGNOSIS — K51319 Ulcerative (chronic) rectosigmoiditis with unspecified complications: Secondary | ICD-10-CM | POA: Diagnosis not present

## 2022-06-06 LAB — CBC WITH DIFFERENTIAL/PLATELET
Basophils Absolute: 0.2 10*3/uL — ABNORMAL HIGH (ref 0.0–0.1)
Basophils Relative: 1.9 % (ref 0.0–3.0)
Eosinophils Absolute: 0.1 10*3/uL (ref 0.0–0.7)
Eosinophils Relative: 1.3 % (ref 0.0–5.0)
HCT: 40.2 % (ref 36.0–46.0)
Hemoglobin: 14.4 g/dL (ref 12.0–15.0)
Lymphocytes Relative: 37.6 % (ref 12.0–46.0)
Lymphs Abs: 3.9 10*3/uL (ref 0.7–4.0)
MCHC: 35.7 g/dL (ref 30.0–36.0)
MCV: 91 fl (ref 78.0–100.0)
Monocytes Absolute: 1.3 10*3/uL — ABNORMAL HIGH (ref 0.1–1.0)
Monocytes Relative: 12.1 % — ABNORMAL HIGH (ref 3.0–12.0)
Neutro Abs: 4.9 10*3/uL (ref 1.4–7.7)
Neutrophils Relative %: 47.1 % (ref 43.0–77.0)
Platelets: 270 10*3/uL (ref 150.0–400.0)
RBC: 4.42 Mil/uL (ref 3.87–5.11)
RDW: 15 % (ref 11.5–15.5)
WBC: 10.4 10*3/uL (ref 4.0–10.5)

## 2022-06-06 LAB — TSH: TSH: 0.59 u[IU]/mL (ref 0.35–5.50)

## 2022-06-06 MED ORDER — MESALAMINE 4 G RE ENEM: 4.0000 g | ENEMA | Freq: Every day | RECTAL | 1 refills | Status: DC

## 2022-06-06 NOTE — Patient Instructions (Addendum)
.  If you are age 71 or older, your body mass index should be between 23-30. Your Body mass index is 26.89 kg/m. If this is out of the aforementioned range listed, please consider follow up with your Primary Care Provider.  If you are age 36 or younger, your body mass index should be between 19-25. Your Body mass index is 26.89 kg/m. If this is out of the aformentioned range listed, please consider follow up with your Primary Care Provider.    Please go to the lab in the basement of our building to have lab work done as you leave today. Hit "B" for basement when you get on the elevator.  When the doors open the lab is on your left.  We will call you with the results. Thank you.  We have sent the following medications to your pharmacy for you to pick up at your convenience: Rowasa  Thank you for entrusting me with your care and for choosing [facility], Dr. Sturgis Cellar

## 2022-06-06 NOTE — Progress Notes (Signed)
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 Olevia Perches. She was initially treated with Lialda 2.4gm daily as well as Hydrocortizone enemas which helped her symptoms. She has been maintained with Lialda since that time, Rowasa added to regimen.    SINCE LAST VISIT   71 year old female here for follow-up visit for her colitis.  Recall she has had this since 2015, on mesalamine monotherapy since that time.  She has never been hospitalized for this and generally has had mild disease.  She has had colonoscopies in the past showing some active inflammation.   Most recently she had a colonoscopy with me in August showing some mildly active colitis in her sigmoid colon.  Biopsies of the rectum also showed some mild activity.  She had been having some increased stool frequency at that time, we added Rowasa enemas to be used every day.  She has continued oral Lialda and Rowasa enemas every day since that time she states clinically she is significantly improved.  She is having roughly 2-3 bowel movements per day which are formed.  She denies any blood in her stool.  No urgency.  She does have some fatigue that has bothered her and wonders if we can check her blood counts.  In September she had an obstructing renal stone that was treated emergently with urology.  She is recovered from that and none since then.  In early September she had a fecal calprotectin that was elevated to the 300s but this was shortly after her colonoscopy.  She reports feeling much better since that time.  We discussed long-term treatment options.  She otherwise feels well without complaints today.     Prior colonoscopy exams: Colonoscopy 02/25/22: The perianal and digital rectal examinations were normal. - The terminal ileum appeared normal. - Multiple small-mouthed diverticula were found in the sigmoid colon. - Areas of mildly erythematous mucosa was found in the recto-sigmoid colon and in the  distal sigmoid colon near areas of diverticulum. Unclear if this represents segmental / diverticular related colitis more so than ulcerative colitis? Biopsies were taken with a cold forceps for histology. - The exam was otherwise without abnormality. - Biopsies were taken with a cold forceps in the rectum for histology.  1. Surgical [P], colon, sigmoid/recto-sigmoid - MINIMALLY ACTIVE CHRONIC COLITIS CONSISTENT WITH INFLAMMATORY BOWEL DISEASE. - NEGATIVE FOR DYSPLASIA. 2. Surgical [P], colon, rectum - MILDLY ACTIVE CHRONIC COLITIS CONSISTENT WITH INFLAMMATORY BOWEL DISEASE. - NEGATIVE FOR DYSPLASIA.   Colonoscopy 11/28/19: The perianal and digital rectal examinations were normal. - Multiple small-mouthed diverticula were found in the sigmoid colon and ascending colon. - A small area of granular mucosa with loss of vascularity - mild was found in the rectosigmoid colon in an area of diverticulosis - unclear if superficial changes from diverticulitis vs. mild active coliis. - Internal hemorrhoids were found during retroflexion. The hemorrhoids were small. - The exam was otherwise without abnormality. No other areas of inflammation appreciated. No polyps. - Biopsies were taken with a cold forceps in the rectum, in the sigmoid colon, in the descending colon and at the splenic flexure for histology.   1. Surgical [P], random left colon bx - FOCAL EROSION WITH MILD INFLAMMATION. - NEGATIVE FOR DYSPLASIA. 2. Surgical [P], colon, rectum/rectosigmoid - MINIMALLY ACTIVE CHRONIC COLITIS. - NEGATIVE FOR DYSPLASIA. - NO GRANULOMAS.     Colonoscopy 09/10/2017 - Preparation of the colon was fair, could not clear the cecal cap. - The examined portion of  the ileum was normal. - One 4 mm polyp in the transverse colon, removed with a cold snare. Resected and retrieved. - Diverticulosis in the sigmoid colon. - The examination was otherwise normal on direct and retroflexion views. No  active inflammation. - Biopsies for surveillance were taken from the descending colon, sigmoid colon and rectum   Path showed focal active colitis, adenomatous polyp   Colonoscopy December 2015 - left sided colitis, path c/w chronic active colitis    Fecal calpro 03/12/22: level of 308     Past Medical History:  Diagnosis Date   Allergy    Apnea, sleep    no c-pap machine   Arthritis    OA   Basal cell carcinoma    on front chest   Blood in stool    LAST 2 MONTHS, MEDS CHNAGED FOR   GERD (gastroesophageal reflux disease)    Heart murmur    Hyperlipidemia    Hypertension    Laryngitis    started monday night   Neuromuscular disorder ([place])    Post-operative nausea and vomiting    Stroke ([place]) 09/21/2007   Synovitis of right hand 12/29/2015   and wrist   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   Ulcerative colitis ([place])    Weakness of right hand    due to stroke     Past Surgical History:  Procedure Laterality Date   COLONSCOPY     SHOULDER ARTHROSCOPY W/ ROTATOR CUFF REPAIR Right 2010   SPLENECTOMY, 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   TOTAL KNEE ARTHROPLASTY Left 2001   TOTAL KNEE ARTHROPLASTY Right 12/31/2015   Procedure: RIGHT TOTAL KNEE ARTHROPLASTY;  Surgeon: Paralee Cancel, MD;  Location: [facility];  Service: Orthopedics;  Laterality: Right;   Family History  Problem Relation Age of Onset   Diabetes Father    Mental illness Father    Narcolepsy Father    Heart disease Paternal Grandmother    Diabetes Paternal Grandmother    Heart attack Maternal Grandfather    Stroke Maternal Grandmother    Colon cancer Neg Hx    Esophageal cancer Neg Hx    Rectal cancer Neg Hx    Stomach cancer Neg Hx    Social History   Tobacco Use   Smoking status: Former    Types: Cigarettes    Quit date: 06/20/1989    Years since quitting: 32.9   Smokeless tobacco: Never   Tobacco comments:    smoked occasional cigarettes for 5 years  Vaping Use   Vaping Use: Never  used  Substance Use Topics   Alcohol use: No    Alcohol/week: 0.0 standard drinks of alcohol   Drug use: No   Current Outpatient Medications  Medication Sig Dispense Refill   alendronate (FOSAMAX) 70 MG tablet Take 70 mg by mouth once a week. Take with a full glass of water on an empty stomach.     aspirin 325 MG tablet 1 tablet     mesalamine (LIALDA) 1.2 g EC tablet TAKE 3 TABLETS BY MOUTH DAILY WITH BREAKFAST. PLEASE KEEP YOUR UPCOMING APPOINTMENT FOR REFILLS 90 tablet 1   metoprolol succinate (TOPROL-XL) 50 MG 24 hr tablet Take 50 mg by mouth daily. Take with or immediately following a meal.     rosuvastatin (CRESTOR) 20 MG tablet Take 20 mg by mouth daily.     tamsulosin (FLOMAX) 0.4 MG CAPS capsule Take 1 capsule (0.4 mg total) by mouth daily. 30 capsule 0  acetaminophen (TYLENOL) 325 MG tablet 1 tablet (Patient not taking: Reported on 06/06/2022)     AMBULATORY NON FORMULARY MEDICATION Panothetic Acid B5 Take 1 tablet by mouth once daily (Patient not taking: Reported on 06/06/2022)     Ascorbic Acid (VITAMIN C) 500 MG CAPS See admin instructions. (Patient not taking: Reported on 06/06/2022)     Cholecalciferol (VITAMIN D PO) Take 1 tablet by mouth daily. (Patient not taking: Reported on 06/06/2022)     ciprofloxacin (CIPRO) 500 MG tablet Take 1 tablet (500 mg total) by mouth 2 (two) times daily. (Patient not taking: Reported on 06/06/2022) 20 tablet 0   Cyanocobalamin (VITAMIN B-12 PO) Take 1 tablet by mouth daily. (Patient not taking: Reported on 06/06/2022)     dicyclomine (BENTYL) 10 MG capsule Take 1 capsule (10 mg total) by mouth every 8 (eight) hours as needed for spasms. (Patient not taking: Reported on 06/06/2022) 30 capsule 1   diphenhydrAMINE (BENADRYL ALLERGY) 25 mg capsule Take 25 mg by mouth daily. (Patient not taking: Reported on 06/06/2022)     ferrous sulfate 325 (65 FE) MG tablet Take 1 tablet (325 mg total) by mouth 3 (three) times daily after meals. (Patient not taking:  Reported on 06/06/2022)  3   mesalamine (ROWASA) 4 g enema INSERT 1 ENEMA PER RECTUM EVERY NIGHT AS NEEDED (Patient not taking: Reported on 06/06/2022) 1680 mL 2   metroNIDAZOLE (FLAGYL) 500 MG tablet Take 1 tablet (500 mg total) by mouth 2 (two) times daily. (Patient not taking: Reported on 06/06/2022) 20 tablet 0   Misc Natural Products (OSTEO BI-FLEX TRIPLE STRENGTH) TABS Take 1 tablet by mouth daily. (Patient not taking: Reported on 06/06/2022)     ondansetron (ZOFRAN-ODT) 8 MG disintegrating tablet Take 1 tablet (8 mg total) by mouth every 8 (eight) hours as needed for nausea or vomiting. (Patient not taking: Reported on 06/06/2022) 20 tablet 0   PANTOTHENIC ACID PO Take 1 tablet by mouth daily. (Patient not taking: Reported on 06/06/2022)     POTASSIUM PO Take 100 mg by mouth daily. OTC potassium (Patient not taking: Reported on 06/06/2022)     thiamine (VITAMIN B-1) 100 MG tablet Take 100 mg by mouth daily. (Patient not taking: Reported on 06/06/2022)     traMADol (ULTRAM) 50 MG tablet Take 1 tablet (50 mg total) by mouth every 6 (six) hours as needed. (Patient not taking: Reported on 06/06/2022) 15 tablet 0   vitamin E 100 UNIT capsule Take by mouth daily. (Patient not taking: Reported on 06/06/2022)     No current facility-administered medications for this visit.   Allergies  Allergen Reactions   Clindamycin Hcl Other (See Comments)   Codeine Nausea And Vomiting   Morphine And Related     Causes hallucinations      Review of Systems: All systems reviewed and negative except where noted in HPI.   Lab Results  Component Value Date   WBC 15.8 (H) 04/02/2022   HGB 14.7 04/02/2022   HCT 41.1 04/02/2022   MCV 91.5 04/02/2022   PLT 307 04/02/2022    Lab Results  Component Value Date   CREATININE 0.75 04/02/2022   BUN 14 04/02/2022   NA 135 04/02/2022   K 3.9 04/02/2022   CL 101 04/02/2022   CO2 22 04/02/2022    Lab Results  Component Value Date   ALT 14 04/02/2022   AST 23  04/02/2022   GGT 19 04/04/2015   ALKPHOS 56 04/02/2022   BILITOT 0.8 04/02/2022  Physical Exam: BP 124/74   Pulse (!) 56   Ht 5' 2"  (1.575 m)   Wt 147 lb (66.7 kg)   BMI 26.89 kg/m  Constitutional: Pleasant,well-developed, female in no acute distress. Neurological: Alert and oriented to person place and time. Psychiatric: Normal mood and affect. Behavior is normal.   ASSESSMENT: 71 y.o. female here for assessment of the following  1. 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 Other fatigue    Left-sided ulcerative colitis managed with oral and rectal mesalamine.  She has had some mildly active colitis in the past, most recent colonoscopy in August showed some active left-sided disease which was mild, but she was having some symptoms with this.  Now on Lialda daily as well as Rowasa enemas, using enemas every day she thinks has provided significant improvement in recent months.  She is happy with the regimen so far and appears to be tolerating the enemas well.  We will check a fecal calprotectin to see if this correlates with her clinical improvement.  We discussed long-term if she is able to keep up with the enema therapy daily or how often she can do this.  If she wants to come off enema therapy, which I think has helped her more than oral, we could consider transition to biologic therapy such as Entyvio or other.  We discussed her options for a bit.  Her preference is to not escalate therapy unless she really needs to which I think is reasonable however again if she would prefer to not take enemas then we can consider Biologics.  We will await her fecal calprotectin, if it is normal her preference is to continue the regimen.  Otherwise, having some fatigue, she wonders about anemia, will repeat her CBC to make sure stable as well as check TSH.  She agrees   PLAN: - continue Lialda and Rowasa daily, clinically doing well, refilled Rowasa - lab for CBC, TSH - lab for  fecal calprotectin  - if fecal calprotectin remains elevated or if she does not think she wants to continue with frequent enema therapy, will discuss how she wishes to proceed - consider biologic therapy, preference would be Entyvio in this setting given favorable side effect / risk profile, if covered by insurance.   Hannah Mango, MD [facility]

## 2022-06-09 ENCOUNTER — Other Ambulatory Visit: Payer: Medicare Other

## 2022-06-09 DIAGNOSIS — R5383 Other fatigue: Secondary | ICD-10-CM

## 2022-06-09 DIAGNOSIS — K51319 Ulcerative (chronic) rectosigmoiditis with unspecified complications: Secondary | ICD-10-CM | POA: Diagnosis not present

## 2022-06-12 LAB — CALPROTECTIN, FECAL: Calprotectin, Fecal: 46 ug/g (ref 0–120)

## 2022-06-24 DIAGNOSIS — N202 Calculus of kidney with calculus of ureter: Secondary | ICD-10-CM | POA: Diagnosis not present

## 2022-07-14 ENCOUNTER — Other Ambulatory Visit: Payer: Self-pay | Admitting: Gastroenterology

## 2022-07-16 DIAGNOSIS — I10 Essential (primary) hypertension: Secondary | ICD-10-CM | POA: Diagnosis not present

## 2022-09-08 ENCOUNTER — Encounter: Payer: Self-pay | Admitting: Gastroenterology

## 2022-10-13 DIAGNOSIS — L821 Other seborrheic keratosis: Secondary | ICD-10-CM | POA: Diagnosis not present

## 2022-10-16 DIAGNOSIS — Z0000 Encounter for general adult medical examination without abnormal findings: Secondary | ICD-10-CM | POA: Diagnosis not present

## 2022-10-16 DIAGNOSIS — K5190 Ulcerative colitis, unspecified, without complications: Secondary | ICD-10-CM | POA: Diagnosis not present

## 2022-10-16 DIAGNOSIS — M810 Age-related osteoporosis without current pathological fracture: Secondary | ICD-10-CM | POA: Diagnosis not present

## 2022-10-16 DIAGNOSIS — G4733 Obstructive sleep apnea (adult) (pediatric): Secondary | ICD-10-CM | POA: Diagnosis not present

## 2022-10-16 DIAGNOSIS — E7800 Pure hypercholesterolemia, unspecified: Secondary | ICD-10-CM | POA: Diagnosis not present

## 2022-10-16 DIAGNOSIS — I341 Nonrheumatic mitral (valve) prolapse: Secondary | ICD-10-CM | POA: Diagnosis not present

## 2022-10-16 DIAGNOSIS — I679 Cerebrovascular disease, unspecified: Secondary | ICD-10-CM | POA: Diagnosis not present

## 2022-10-16 DIAGNOSIS — I10 Essential (primary) hypertension: Secondary | ICD-10-CM | POA: Diagnosis not present

## 2022-10-16 DIAGNOSIS — Z9081 Acquired absence of spleen: Secondary | ICD-10-CM | POA: Diagnosis not present

## 2022-11-19 DIAGNOSIS — Z1231 Encounter for screening mammogram for malignant neoplasm of breast: Secondary | ICD-10-CM | POA: Diagnosis not present

## 2022-11-29 ENCOUNTER — Other Ambulatory Visit: Payer: Self-pay | Admitting: Gastroenterology

## 2022-12-11 ENCOUNTER — Ambulatory Visit: Payer: Medicare Other | Admitting: Gastroenterology

## 2022-12-11 ENCOUNTER — Encounter: Payer: Self-pay | Admitting: Gastroenterology

## 2022-12-11 VITALS — BP 124/70 | HR 64 | Ht 62.0 in | Wt 149.2 lb

## 2022-12-11 DIAGNOSIS — K5130 Ulcerative (chronic) rectosigmoiditis without complications: Secondary | ICD-10-CM | POA: Diagnosis not present

## 2022-12-11 MED ORDER — MESALAMINE 4 G RE ENEM: 4.0000 g | ENEMA | Freq: Every day | RECTAL | 3 refills | Status: DC

## 2022-12-11 MED ORDER — MESALAMINE 1.2 G PO TBEC: 2.4000 g | DELAYED_RELEASE_TABLET | Freq: Every day | ORAL | 3 refills | Status: DC

## 2022-12-11 NOTE — Patient Instructions (Addendum)
If your blood pressure at your visit was 140/90 or greater, please contact your primary care physician to follow up on this. ______________________________________________________  If you are age 72 or older, your body mass index should be between 23-30. Your Body mass index is 27.3 kg/m. If this is out of the aforementioned range listed, please consider follow up with your Primary Care Provider.  If you are age 52 or younger, your body mass index should be between 19-25. Your Body mass index is 27.3 kg/m. If this is out of the aformentioned range listed, please consider follow up with your Primary Care Provider.  ________________________________________________________  The [facility] providers would like to encourage you to use [facility] to communicate with providers for non-urgent requests or questions.  Due to long hold times on the telephone, sending your provider a message by [facility] may be a faster and more efficient way to get a response.  Please allow 48 business hours for a response.  Please remember that this is for non-urgent requests.  _______________________________________________________  Due to recent changes in healthcare laws, you may see the results of your imaging and laboratory studies on MyChart before your provider has had a chance to review them.  We understand that in some cases there may be results that are confusing or concerning to you. Not all laboratory results come back in the same time frame and the provider may be waiting for multiple results in order to interpret others.  Please give [place] 48 hours in order for your provider to thoroughly review all the results before contacting the office for clarification of your results.    We have sent the following medications to your pharmacy for you to pick up at your convenience: Rowasa enemas - use daily at bedtime Lialda - Take 2 tablets (2.4 g) daily  You will be due for a fecal calprotectin stool test in September.  We  will remind you when it is time to go to the lab.  Please follow up in 6 to 9 months.  Thank you for entrusting me with your care and for choosing [facility], Dr. Ileene Patrick

## 2022-12-11 NOTE — Progress Notes (Signed)
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 Juanda Chance. She was initially treated with Lialda 2.4gm daily as well as Hydrocortizone enemas which helped her symptoms. She has been maintained with Lialda since that time, Rowasa added to regimen.    SINCE LAST VISIT    72 year old female here for follow-up visit for her colitis.  Recall she has had this since 2015, on mesalamine monotherapy since that time.  She has never been hospitalized for this and generally has had mild disease.  She has had colonoscopies in the past showing some active inflammation.    Last colonoscopy August 2023 showing some mildly active colitis in her sigmoid colon.  Biopsies of the rectum also showed some mild activity.  Fecal calprotectin shortly thereafter was in the 300s.   We added Rowasa enemas at that time which resolved her symptoms, she felt much better in regards to urgency frequency etc.  Fecal calprotectin repeated on that regimen in December 2023 and had reduced to 46/normal.  She has been maintained on Lialda 3 tabs daily (down from previous of 4 tabs) as well as Rowasa enemas.  Since have seen her her symptoms have remained well-controlled.  She has stable bowel movements.  No loose stools.  No urgency, no incontinence.  No blood in the stools.  She is happy with the regimen, compliant with the enemas routinely.  We discussed long-term plan moving forward.  She otherwise feels well other than some lethargy which has been ongoing for some time.  She had a blood count drawn in December which was normal, no anemia.     Prior colonoscopy exams: Colonoscopy 02/25/22: The perianal and digital rectal examinations were normal. - The terminal ileum appeared normal. - Multiple small-mouthed diverticula were found in the sigmoid colon. - Areas of mildly erythematous mucosa was found in the recto-sigmoid colon and in the distal sigmoid colon near areas of diverticulum. Unclear if this  represents segmental / diverticular related colitis more so than ulcerative colitis? Biopsies were taken with a cold forceps for histology. - The exam was otherwise without abnormality. - Biopsies were taken with a cold forceps in the rectum for histology.   1. Surgical [P], colon, sigmoid/recto-sigmoid - MINIMALLY ACTIVE CHRONIC COLITIS CONSISTENT WITH INFLAMMATORY BOWEL DISEASE. - NEGATIVE FOR DYSPLASIA. 2. Surgical [P], colon, rectum - MILDLY ACTIVE CHRONIC COLITIS CONSISTENT WITH INFLAMMATORY BOWEL DISEASE. - NEGATIVE FOR DYSPLASIA.     Colonoscopy 11/28/19: The perianal and digital rectal examinations were normal. - Multiple small-mouthed diverticula were found in the sigmoid colon and ascending colon. - A small area of granular mucosa with loss of vascularity - mild was found in the rectosigmoid colon in an area of diverticulosis - unclear if superficial changes from diverticulitis vs. mild active coliis. - Internal hemorrhoids were found during retroflexion. The hemorrhoids were small. - The exam was otherwise without abnormality. No other areas of inflammation appreciated. No polyps. - Biopsies were taken with a cold forceps in the rectum, in the sigmoid colon, in the descending colon and at the splenic flexure for histology.   1. Surgical [P], random left colon bx - FOCAL EROSION WITH MILD INFLAMMATION. - NEGATIVE FOR DYSPLASIA. 2. Surgical [P], colon, rectum/rectosigmoid - MINIMALLY ACTIVE CHRONIC COLITIS. - NEGATIVE FOR DYSPLASIA. - NO GRANULOMAS.     Colonoscopy 09/10/2017 - Preparation of the colon was fair, could not clear the cecal cap. - The examined portion of the ileum was normal. - One 4 mm  polyp in the transverse colon, removed with a cold snare. Resected and retrieved. - Diverticulosis in the sigmoid colon. - The examination was otherwise normal on direct and retroflexion views. No active inflammation. - Biopsies for surveillance were taken from the  descending colon, sigmoid colon and rectum   Path showed focal active colitis, adenomatous polyp   Colonoscopy December 2015 - left sided colitis, path c/w chronic active colitis     Fecal calpro 03/12/22: level of 308  Fecal calprotectin 06/09/22: 46   Past Medical History:  Diagnosis Date   Allergy    Apnea, sleep    no c-pap machine   Arthritis    OA   Basal cell carcinoma    on front chest   Blood in stool    LAST 2 MONTHS, MEDS CHNAGED FOR   GERD (gastroesophageal reflux disease)    Heart murmur    Hyperlipidemia    Hypertension    Kidney stones    Laryngitis    started monday night   Neuromuscular disorder (HCC)    Post-operative nausea and vomiting    Stroke (HCC) 09/21/2007   Synovitis of right hand 12/29/2015   and wrist   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   Ulcerative colitis (HCC)    Weakness of right hand    due to stroke     Past Surgical History:  Procedure Laterality Date   COLONSCOPY     SHOULDER ARTHROSCOPY W/ ROTATOR CUFF REPAIR Right 2010   SPLENECTOMY, 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 at 4 yers old   TOTAL KNEE ARTHROPLASTY Left 2001   TOTAL KNEE ARTHROPLASTY Right 12/31/2015   Procedure: RIGHT TOTAL KNEE ARTHROPLASTY;  Surgeon: Durene Romans, MD;  Location: [facility];  Service: Orthopedics;  Laterality: Right;   Family History  Problem Relation Age of Onset   Diabetes Father    Mental illness Father    Narcolepsy Father    Heart disease Paternal Grandmother    Diabetes Paternal Grandmother    Heart attack Maternal Grandfather    Stroke Maternal Grandmother    Colon cancer Neg Hx    Esophageal cancer Neg Hx    Rectal cancer Neg Hx    Stomach cancer Neg Hx    Social History   Tobacco Use   Smoking status: Former    Types: Cigarettes    Quit date: 06/20/1989    Years since quitting: 33.4   Smokeless tobacco: Never   Tobacco comments:    smoked occasional cigarettes for 5 years  Vaping Use   Vaping Use: Never used  Substance Use  Topics   Alcohol use: No    Alcohol/week: 0.0 standard drinks of alcohol   Drug use: No   Current Outpatient Medications  Medication Sig Dispense Refill   acetaminophen (TYLENOL) 325 MG tablet      alendronate (FOSAMAX) 70 MG tablet Take 70 mg by mouth once a week. Take with a full glass of water on an empty stomach.     AMBULATORY NON FORMULARY MEDICATION Panothetic Acid B5 Take 1 tablet by mouth once daily     aspirin 325 MG tablet 1 tablet     Cholecalciferol (VITAMIN D PO) Take 1 tablet by mouth daily.     Cyanocobalamin (VITAMIN B-12 PO) Take 1 tablet by mouth daily.     dicyclomine (BENTYL) 10 MG capsule Take 1 capsule (10 mg total) by mouth every 8 (eight) hours as needed for spasms. 30 capsule 1   diphenhydrAMINE (BENADRYL  ALLERGY) 25 mg capsule Take 25 mg by mouth daily.     ferrous sulfate 325 (65 FE) MG tablet Take 1 tablet (325 mg total) by mouth 3 (three) times daily after meals.  3   losartan (COZAAR) 50 MG tablet Take 50 mg by mouth daily.     mesalamine (LIALDA) 1.2 g EC tablet Take 3 tablets (3.6 g total) by mouth daily with breakfast. Please keep your June appointment for further refills. Thank you 270 tablet 0   mesalamine (ROWASA) 4 g enema Place 60 mLs (4 g total) rectally at bedtime. 1800 mL 1   metoprolol succinate (TOPROL-XL) 50 MG 24 hr tablet Take 50 mg by mouth daily. Take with or immediately following a meal.     ondansetron (ZOFRAN-ODT) 8 MG disintegrating tablet Take 1 tablet (8 mg total) by mouth every 8 (eight) hours as needed for nausea or vomiting. 20 tablet 0   rosuvastatin (CRESTOR) 20 MG tablet Take 20 mg by mouth daily.     tamsulosin (FLOMAX) 0.4 MG CAPS capsule Take 1 capsule (0.4 mg total) by mouth daily. 30 capsule 0   thiamine (VITAMIN B-1) 100 MG tablet Take 100 mg by mouth daily.     No current facility-administered medications for this visit.   Allergies  Allergen Reactions   Clindamycin Hcl Other (See Comments)   Codeine Nausea And  Vomiting   Morphine And Codeine     Causes hallucinations      Review of Systems: All systems reviewed and negative except where noted in HPI.   Lab Results  Component Value Date   WBC 10.4 06/06/2022   HGB 14.4 06/06/2022   HCT 40.2 06/06/2022   MCV 91.0 06/06/2022   PLT 270.0 06/06/2022    Physical Exam: BP 124/70 (BP Location: Left Arm, Patient Position: Sitting, Cuff Size: Normal)   Pulse 64 Comment: irregular  Ht 5\' 2"  (1.575 m)   Wt 149 lb 4 oz (67.7 kg)   BMI 27.30 kg/m  Constitutional: Pleasant,well-developed, female in no acute distress. Neurological: Alert and oriented to person place and time. Psychiatric: Normal mood and affect. Behavior is normal.   ASSESSMENT: 72 y.o. female here for assessment of the following  1. Ulcerative rectosigmoiditis without complication (HCC)    Generally doing really well since have last seen her.  Recall on oral mesalamine/Lialda monotherapy her disease was not controlled and had some active inflammation on colonoscopy with elevated fecal calprotectin.  Rowasa enema added daily which normalized her fecal calprotectin and result her symptoms.  She is feeling pretty well on the regimen.  She had decreased from 4 tabs of Lialda per day to 3 tablets and noticed no change in symptoms.  I think we can continue to decrease her Lialda, we will go to 2 tabs per day.  She is comfortable continuing her Rowasa enemas and feels that they have helped her.  Moving forward, we will plan on another fecal calprotectin in a few months.  If it remains normal on lower dose of Lialda, maybe we can stop the oral Lialda and manage this with Rowasa monotherapy.  She is agreeable to continue enema therapy and avoid escalation to Biologics if that is needed.  The enema therapy is working much better for her than oral mono therapy.  She agrees with the plan.  Will refill medications today, follow-up on fecal calprotectin in 3 months with further recommendations.   Holding off on colonoscopy at this time, eye exam up-to-date from last year.  PLAN: - decrease Lialda to 2 tabs per day - refilled  - continue Rowasa enemas daily - refilled - repeat fecal calprotectin in 3 months - follow up in 6-12 months  Harlin Rain, MD [facility]

## 2022-12-26 ENCOUNTER — Other Ambulatory Visit: Payer: Self-pay | Admitting: Gastroenterology

## 2023-03-11 ENCOUNTER — Telehealth: Payer: Self-pay

## 2023-03-11 NOTE — Telephone Encounter (Signed)
-----   Message from [facility] H sent at 12/11/2022 10:36 AM EDT ----- Regarding: due for fecal calprotectin Patient will be due for fecal calprotectin in September

## 2023-03-11 NOTE — Telephone Encounter (Signed)
Called patient but unable to leave a message.  Patient does not use Mychart

## 2023-03-12 ENCOUNTER — Other Ambulatory Visit: Payer: Medicare Other

## 2023-03-12 ENCOUNTER — Other Ambulatory Visit: Payer: Self-pay | Admitting: Gastroenterology

## 2023-03-12 DIAGNOSIS — K5130 Ulcerative (chronic) rectosigmoiditis without complications: Secondary | ICD-10-CM

## 2023-03-12 NOTE — Telephone Encounter (Signed)
Called and Left message for patient to please go to the lab for stool test

## 2023-03-17 ENCOUNTER — Other Ambulatory Visit: Payer: Medicare Other

## 2023-03-17 DIAGNOSIS — K5130 Ulcerative (chronic) rectosigmoiditis without complications: Secondary | ICD-10-CM | POA: Diagnosis not present

## 2023-03-20 ENCOUNTER — Other Ambulatory Visit: Payer: Self-pay

## 2023-03-20 ENCOUNTER — Telehealth: Payer: Self-pay | Admitting: Gastroenterology

## 2023-03-20 DIAGNOSIS — K5130 Ulcerative (chronic) rectosigmoiditis without complications: Secondary | ICD-10-CM

## 2023-03-20 LAB — CALPROTECTIN, FECAL: Calprotectin, Fecal: 192 ug/g — ABNORMAL HIGH (ref 0–120)

## 2023-03-20 MED ORDER — MESALAMINE 1.2 G PO TBEC: 4.8000 g | DELAYED_RELEASE_TABLET | Freq: Every day | ORAL | 1 refills | Status: DC

## 2023-03-20 NOTE — Telephone Encounter (Signed)
Spoke with patient, see 03/17/23 stool study result note for details

## 2023-03-20 NOTE — Telephone Encounter (Signed)
Patient is returning your call.  

## 2023-03-24 ENCOUNTER — Telehealth: Payer: Self-pay | Admitting: Gastroenterology

## 2023-03-24 NOTE — Telephone Encounter (Signed)
Inbound call from patient, states she clarified information with pharmacy and to disregard note.

## 2023-03-24 NOTE — Telephone Encounter (Signed)
Inbound call from patient, would like to discuss mesalamine medication patient states she needs the 51m supply and also that the medication is too expensive. Would like to discuss more options.

## 2023-03-25 DIAGNOSIS — H31002 Unspecified chorioretinal scars, left eye: Secondary | ICD-10-CM | POA: Diagnosis not present

## 2023-03-25 DIAGNOSIS — H25813 Combined forms of age-related cataract, bilateral: Secondary | ICD-10-CM | POA: Diagnosis not present

## 2023-03-25 DIAGNOSIS — H52203 Unspecified astigmatism, bilateral: Secondary | ICD-10-CM | POA: Diagnosis not present

## 2023-04-08 DIAGNOSIS — I679 Cerebrovascular disease, unspecified: Secondary | ICD-10-CM | POA: Diagnosis not present

## 2023-04-08 DIAGNOSIS — Z23 Encounter for immunization: Secondary | ICD-10-CM | POA: Diagnosis not present

## 2023-04-08 DIAGNOSIS — I341 Nonrheumatic mitral (valve) prolapse: Secondary | ICD-10-CM | POA: Diagnosis not present

## 2023-04-08 DIAGNOSIS — E7800 Pure hypercholesterolemia, unspecified: Secondary | ICD-10-CM | POA: Diagnosis not present

## 2023-04-08 DIAGNOSIS — K219 Gastro-esophageal reflux disease without esophagitis: Secondary | ICD-10-CM | POA: Diagnosis not present

## 2023-04-08 DIAGNOSIS — K5190 Ulcerative colitis, unspecified, without complications: Secondary | ICD-10-CM | POA: Diagnosis not present

## 2023-04-08 DIAGNOSIS — Z9081 Acquired absence of spleen: Secondary | ICD-10-CM | POA: Diagnosis not present

## 2023-04-08 DIAGNOSIS — I10 Essential (primary) hypertension: Secondary | ICD-10-CM | POA: Diagnosis not present

## 2023-04-08 DIAGNOSIS — M810 Age-related osteoporosis without current pathological fracture: Secondary | ICD-10-CM | POA: Diagnosis not present

## 2023-06-19 ENCOUNTER — Telehealth: Payer: Self-pay | Admitting: *Deleted

## 2023-06-19 NOTE — Telephone Encounter (Signed)
Patient returned call and was informed she needed to pick up the stool kit for the Fecal Calprotectin lab. Also informed the patient that she will need to just give her name and DOB and she will be given the stool kit with the explanation on how to use and when to bring back. Patient understood and agreed.

## 2023-06-19 NOTE — Telephone Encounter (Signed)
-----   Message from Nurse [place] P sent at 03/20/2023  4:26 PM EDT ----- Regarding: 58-month lab reminder Fecal calprotectin due - order in epic

## 2023-06-19 NOTE — Telephone Encounter (Signed)
Called patient to inform Fecal Calprotectin is due, no answer; left message to call back at [phone].

## 2023-06-24 ENCOUNTER — Other Ambulatory Visit: Payer: Medicare Other

## 2023-06-24 DIAGNOSIS — K5130 Ulcerative (chronic) rectosigmoiditis without complications: Secondary | ICD-10-CM

## 2023-06-26 LAB — CALPROTECTIN, FECAL: Calprotectin, Fecal: 123 ug/g — ABNORMAL HIGH (ref 0–120)

## 2023-07-15 DIAGNOSIS — K5190 Ulcerative colitis, unspecified, without complications: Secondary | ICD-10-CM | POA: Diagnosis not present

## 2023-07-15 DIAGNOSIS — R5383 Other fatigue: Secondary | ICD-10-CM | POA: Diagnosis not present

## 2023-07-15 DIAGNOSIS — R7309 Other abnormal glucose: Secondary | ICD-10-CM | POA: Diagnosis not present

## 2023-08-31 ENCOUNTER — Other Ambulatory Visit: Payer: Self-pay | Admitting: Gastroenterology

## 2023-09-16 ENCOUNTER — Telehealth: Payer: Self-pay | Admitting: Pharmacy Technician

## 2023-09-16 ENCOUNTER — Encounter: Payer: Self-pay | Admitting: Gastroenterology

## 2023-09-16 ENCOUNTER — Ambulatory Visit: Payer: Medicare Other | Admitting: Gastroenterology

## 2023-09-16 ENCOUNTER — Other Ambulatory Visit: Payer: Self-pay | Admitting: Gastroenterology

## 2023-09-16 VITALS — BP 124/70 | HR 55 | Ht 62.0 in | Wt 153.0 lb

## 2023-09-16 DIAGNOSIS — K5130 Ulcerative (chronic) rectosigmoiditis without complications: Secondary | ICD-10-CM | POA: Diagnosis not present

## 2023-09-16 NOTE — Patient Instructions (Addendum)
 We will request your labs from Dr. Michaelle Copas office.  Please follow up in 6 months (September 2025). We will remind you when it is time to schedule.  Thank you for entrusting me with your care and for choosing [facility], Dr. Ileene Patrick     If your blood pressure at your visit was 140/90 or greater, please contact your primary care physician to follow up on this. ______________________________________________________  If you are age 73 or older, your body mass index should be between 23-30. Your Body mass index is 27.98 kg/m. If this is out of the aforementioned range listed, please consider follow up with your Primary Care Provider.  If you are age 73 or younger, your body mass index should be between 19-25. Your Body mass index is 27.98 kg/m. If this is out of the aformentioned range listed, please consider follow up with your Primary Care Provider.  ________________________________________________________  The [place] [facility] providers would like to encourage you to use [facility] to communicate with providers for non-urgent requests or questions.  Due to long hold times on the telephone, sending your provider a message by [facility] may be a faster and more efficient way to get a response.  Please allow 48 business hours for a response.  Please remember that this is for non-urgent requests.  _______________________________________________________  Due to recent changes in healthcare laws, you may see the results of your imaging and laboratory studies on MyChart before your provider has had a chance to review them.  We understand that in some cases there may be results that are confusing or concerning to you. Not all laboratory results come back in the same time frame and the provider may be waiting for multiple results in order to interpret others.  Please give [place] 48 hours in order for your provider to thoroughly review all the results before contacting the office for clarification of  your results.

## 2023-09-16 NOTE — Progress Notes (Signed)
 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 Juanda Chance. She was initially treated with Lialda 2.4gm daily as well as Hydrocortizone enemas which helped her symptoms. She has been maintained with Lialda since that time, Rowasa added to regimen.     SINCE LAST VISIT   73 year old female here for follow-up visit for ulcerative colitis.  She has been doing well since of last seen her.  Recall last colonoscopy August 2023 showing some mildly active colitis in her sigmoid colon.  Biopsies of the rectum also showed some mild activity.    Over time we added Rowasa enemas and she was on oral Lialda 2 tabs daily.  If follow-up fecal calprotectin in September 2024 was 192.  We increased her oral Lialda to 4 tabs daily and continue Rowasa enemas daily.  She states her symptoms on the regimen are well-controlled.  Fecal calprotectin done on December 18 on higher dosing of Lialda was better at 123, but remains mildly elevated.  She currently has remained on the regimen, denies any symptoms that bother her.  Her bowel habits are normal, no blood, no urgency at baseline.  If she has a very large meal she can have some urgency and occasional cramps and pains but mostly is asymptomatic.  She does inquire about some other options in regards to Rowasa use which she states is becoming more difficult to use on a daily basis.  She has been on this for a long time and we discussed her options.  Of note, she has had blood work done within the past year at her primary care [facility], as attached below:  Labs done on January 2025: TSH 1.42, A1c 5.0 hemoglobin 15.0, MCV 94.6, platelets 251, BUN 23, creatinine 0.79, LFTs normal      Prior colonoscopy exams: Colonoscopy 02/25/22: The perianal and digital rectal examinations were normal. - The terminal ileum appeared normal. - Multiple small-mouthed diverticula were found in the sigmoid colon. - Areas of mildly erythematous mucosa was  found in the recto-sigmoid colon and in the distal sigmoid colon near areas of diverticulum. Unclear if this represents segmental / diverticular related colitis more so than ulcerative colitis? Biopsies were taken with a cold forceps for histology. - The exam was otherwise without abnormality. - Biopsies were taken with a cold forceps in the rectum for histology.   1. Surgical [P], colon, sigmoid/recto-sigmoid - MINIMALLY ACTIVE CHRONIC COLITIS CONSISTENT WITH INFLAMMATORY BOWEL DISEASE. - NEGATIVE FOR DYSPLASIA. 2. Surgical [P], colon, rectum - MILDLY ACTIVE CHRONIC COLITIS CONSISTENT WITH INFLAMMATORY BOWEL DISEASE. - NEGATIVE FOR DYSPLASIA.     Colonoscopy 11/28/19: The perianal and digital rectal examinations were normal. - Multiple small-mouthed diverticula were found in the sigmoid colon and ascending colon. - A small area of granular mucosa with loss of vascularity - mild was found in the rectosigmoid colon in an area of diverticulosis - unclear if superficial changes from diverticulitis vs. mild active coliis. - Internal hemorrhoids were found during retroflexion. The hemorrhoids were small. - The exam was otherwise without abnormality. No other areas of inflammation appreciated. No polyps. - Biopsies were taken with a cold forceps in the rectum, in the sigmoid colon, in the descending colon and at the splenic flexure for histology.   1. Surgical [P], random left colon bx - FOCAL EROSION WITH MILD INFLAMMATION. - NEGATIVE FOR DYSPLASIA. 2. Surgical [P], colon, rectum/rectosigmoid - MINIMALLY ACTIVE CHRONIC COLITIS. - NEGATIVE FOR DYSPLASIA. - NO GRANULOMAS.  Colonoscopy 09/10/2017 - Preparation of the colon was fair, could not clear the cecal cap. - The examined portion of the ileum was normal. - One 4 mm polyp in the transverse colon, removed with a cold snare. Resected and retrieved. - Diverticulosis in the sigmoid colon. - The examination was otherwise normal on  direct and retroflexion views. No active inflammation. - Biopsies for surveillance were taken from the descending colon, sigmoid colon and rectum   Path showed focal active colitis, adenomatous polyp   Colonoscopy December 2015 - left sided colitis, path c/w chronic active colitis        Fecal calprotectin 03/2023: 192 - increased lialda back to 4 tabs per day and continued Rowasa  Fecal calprotectin 06/24/23: 123 -    Past Medical History:  Diagnosis Date   Allergy    Apnea, sleep    no c-pap machine   Arthritis    OA   Basal cell carcinoma    on front chest   Blood in stool    LAST 2 MONTHS, MEDS CHNAGED FOR   GERD (gastroesophageal reflux disease)    Heart murmur    Hyperlipidemia    Hypertension    Kidney stones    Laryngitis    started monday night   Neuromuscular disorder (HCC)    Post-operative nausea and vomiting    Stroke (HCC) 09/21/2007   Synovitis of right hand 12/29/2015   and wrist   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   Ulcerative colitis (HCC)    Weakness of right hand    due to stroke     Past Surgical History:  Procedure Laterality Date   COLONSCOPY     SHOULDER ARTHROSCOPY W/ ROTATOR CUFF REPAIR Right 2010   SPLENECTOMY, 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 at 71 yers old   TOTAL KNEE ARTHROPLASTY Left 2001   TOTAL KNEE ARTHROPLASTY Right 12/31/2015   Procedure: RIGHT TOTAL KNEE ARTHROPLASTY;  Surgeon: Durene Romans, MD;  Location: [facility];  Service: Orthopedics;  Laterality: Right;   Family History  Problem Relation Age of Onset   Diabetes Father    Mental illness Father    Narcolepsy Father    Heart disease Paternal Grandmother    Diabetes Paternal Grandmother    Heart attack Maternal Grandfather    Stroke Maternal Grandmother    Colon cancer Neg Hx    Esophageal cancer Neg Hx    Rectal cancer Neg Hx    Stomach cancer Neg Hx    Social History   Tobacco Use   Smoking status: Former    Current packs/day: 0.00    Types: Cigarettes     Quit date: 06/20/1989    Years since quitting: 34.2   Smokeless tobacco: Never   Tobacco comments:    smoked occasional cigarettes for 5 years  Vaping Use   Vaping status: Never Used  Substance Use Topics   Alcohol use: No    Alcohol/week: 0.0 standard drinks of alcohol   Drug use: No   Current Outpatient Medications  Medication Sig Dispense Refill   acetaminophen (TYLENOL) 325 MG tablet      alendronate (FOSAMAX) 70 MG tablet Take 70 mg by mouth once a week. Take with a full glass of water on an empty stomach.     AMBULATORY NON FORMULARY MEDICATION Panothetic Acid B5 Take 1 tablet by mouth once daily     Apoaequorin (PREVAGEN PO) Take by mouth.     aspirin 325 MG tablet 1 tablet  calcium carbonate (OSCAL) 1500 (600 Ca) MG TABS tablet Take by mouth 2 (two) times daily with a meal.     Cholecalciferol (VITAMIN D PO) Take 1 tablet by mouth daily.     diphenhydrAMINE (BENADRYL ALLERGY) 25 mg capsule Take 25 mg by mouth daily.     ferrous sulfate 325 (65 FE) MG tablet Take 1 tablet (325 mg total) by mouth 3 (three) times daily after meals.  3   losartan (COZAAR) 50 MG tablet Take 50 mg by mouth daily.     mesalamine (LIALDA) 1.2 g EC tablet Take 4 tablets (4.8 g total) by mouth daily with breakfast. 360 tablet 1   mesalamine (ROWASA) 4 g enema Place 60 mLs (4 g total) rectally at bedtime. Please schedule a yearly follow up for further refills. Thank you 5400 mL 1   metoprolol succinate (TOPROL-XL) 50 MG 24 hr tablet Take 50 mg by mouth daily. Take with or immediately following a meal.     rosuvastatin (CRESTOR) 20 MG tablet Take 20 mg by mouth daily.     thiamine (VITAMIN B-1) 100 MG tablet Take 100 mg by mouth daily.     Cyanocobalamin (VITAMIN B-12 PO) Take 1 tablet by mouth daily. (Patient not taking: Reported on 09/16/2023)     dicyclomine (BENTYL) 10 MG capsule Take 1 capsule (10 mg total) by mouth every 8 (eight) hours as needed for spasms. (Patient not taking: Reported on  09/16/2023) 30 capsule 1   ondansetron (ZOFRAN-ODT) 8 MG disintegrating tablet Take 1 tablet (8 mg total) by mouth every 8 (eight) hours as needed for nausea or vomiting. (Patient not taking: Reported on 09/16/2023) 20 tablet 0   tamsulosin (FLOMAX) 0.4 MG CAPS capsule Take 1 capsule (0.4 mg total) by mouth daily. (Patient not taking: Reported on 09/16/2023) 30 capsule 0   No current facility-administered medications for this visit.   Allergies  Allergen Reactions   Clindamycin Hcl Other (See Comments)   Codeine Nausea And Vomiting   Morphine And Codeine     Causes hallucinations      Review of Systems: All systems reviewed and negative except where noted in HPI.   Labs reviewed from Dr. Katrinka Blazing in HPI  Physical Exam: BP 124/70   Pulse (!) 55   Ht 5\' 2"  (1.575 m)   Wt 153 lb (69.4 kg)   BMI 27.98 kg/m  Constitutional: Pleasant,well-developed, female in no acute distress. Neurological: Alert and oriented to person place and time. Psychiatric: Normal mood and affect. Behavior is normal.   ASSESSMENT: 73 y.o. female here for assessment of the following  1. 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  I reviewed her prior colonoscopy results and fecal calprotectin stool testing with her.  She has been most recently on highest dose oral mesalamine and Rowasa enemas daily.  This regimen does work to keep her symptoms controlled for the most part but she has had persistent mildly elevated fecal calprotectin on labs and has not been in endoscopic remission on colonoscopy.  We discussed options.  Goals of therapy are for symptomatic improvement/resolution and to reduce her risk for colon cancer.  Her colitis is only mildly active, hopefully her colon cancer risk is not too high.  We discussed alternative therapies, particularly to Rowasa enemas as indefinite use of this is not appealing.  She has rather mild disease and if she want to come off  mesalamine, specifically Rowasa, we discussed her options at length.  I think Entyvio  may be a really good option for her given favorable safety profile, not much immunosuppression, and she would be interested in this if covered by insurance with goals of getting off Rowasa enemas/mesalamine.  Initially I will place referral for this to the infusion center so they can inquire what will be covered under her insurance.  If this is covered or cost equivalent to mesalamine (which is not cheaper for her), her preference would be to switch to [facility]  If it is cost prohibitive, we will see what her other options are for her insurance company.  If we make a change in therapy, I would recommend doing a colonoscopy in 6 to 9 months to assess for mucosal healing.  She was agreeable with this plan   PLAN: - discussed options, going to see if entyvio is covered by insurance and if so she would like to try it to get off rowasa enemas daily - obtained labs from Dr. Sonny Masters Smith's office - [facility] - f/u 6 months or sooner with issues. If we do change her therapy would plan for colonoscopy in 6-9 months.  Harlin Rain, MD [facility]

## 2023-09-16 NOTE — Telephone Encounter (Signed)
 Patient will be scheduled as soon as possible.  Auth Submission: APPROVED Site of care: Site of care: [facility] Payer: [facility] MEDICARE Medication & CPT/J Code(s) submitted: Entyvio (Vedolizumab) [id number] Route of submission (phone, fax, portal):  Phone # Fax # Auth type: Buy/Bill PB Units/visits requested: 300MG  Arthor Captain Q8WKS Reference number: [id number] Approval from: 09/16/23 to 09/16/23

## 2023-09-16 NOTE — Telephone Encounter (Signed)
 Wonderful, that is great news. I think the patient will proceed as long as covered by her insurance and no significant copay

## 2023-09-17 NOTE — Telephone Encounter (Signed)
 Patient will need PAP (Free drug).  [facility] has been notified to reach out to patient.  I will f/u once I have a response from the program.

## 2023-09-21 ENCOUNTER — Telehealth: Payer: Self-pay | Admitting: Gastroenterology

## 2023-09-21 NOTE — Telephone Encounter (Signed)
 Advised patient that unfortunately, we are unable to take medications for another patient's use but thanked her for her thoughtfulness and willingness to help others. Patient does question how long she should maintain on lialda and meslamine enemas following induction of biologics.

## 2023-09-21 NOTE — Telephone Encounter (Signed)
 Patient called and stated that she was approved for the shot for her colitis. Patient stated that she has medication left over for the enama's and mesalamine and was wondering if we could accept those for other patient that are having trouble getting these medications. Patient would like a call back. Please advise.

## 2023-09-21 NOTE — Telephone Encounter (Signed)
 I have spoken to patient to advise that she had discontinue lialda and rowasa once she starts Entyvio. She verbalizes understanding.

## 2023-09-21 NOTE — Telephone Encounter (Signed)
 Thanks Dottie.  Glad to hear she is approved for [facility]. She can stop Lialda/Rowasa once she starts the [place]. She can keep it at home however for the first few months in case she has any flares and needs to add it back. Thanks

## 2023-09-24 NOTE — Telephone Encounter (Signed)
 Dr. Adela Lank, I will re-fax them now. Thanks Selena Batten

## 2023-09-24 NOTE — Telephone Encounter (Signed)
 POD A RN can you please keep an eye out for this fax? Thanks

## 2023-09-24 NOTE — Telephone Encounter (Signed)
 Dr. Young Berry, / [place],  Patient assistance forms have been faxed to [phone]. Please sign the forms and return to me. Thanks Selena Batten

## 2023-09-24 NOTE — Telephone Encounter (Signed)
 Okay, happy to sign them. I have not seen them come in yet. Thanks

## 2023-09-25 NOTE — Telephone Encounter (Signed)
Form is in your inbox to sign.

## 2023-09-25 NOTE — Telephone Encounter (Signed)
 Form has been faxed back.

## 2023-09-25 NOTE — Telephone Encounter (Signed)
I signed it. Thank you.

## 2023-09-25 NOTE — Telephone Encounter (Signed)
 Linda, Thanks, I have received the forms and have sent them back to [facility].  I will f/u once we have a response from [employer]. Selena Batten

## 2023-10-07 ENCOUNTER — Ambulatory Visit

## 2023-10-07 VITALS — BP 126/8 | HR 55 | Temp 97.7°F | Resp 12 | Ht 62.0 in | Wt 149.6 lb

## 2023-10-07 DIAGNOSIS — K5130 Ulcerative (chronic) rectosigmoiditis without complications: Secondary | ICD-10-CM

## 2023-10-07 MED ORDER — VEDOLIZUMAB 300 MG IV SOLR
300.0000 mg | Freq: Once | INTRAVENOUS | Status: AC
  Administered 2023-10-07: 300 mg via INTRAVENOUS
  Filled 2023-10-07: qty 5

## 2023-10-07 NOTE — Progress Notes (Signed)
 Diagnosis: Ulcerative Colitis  Provider:  Chilton Greathouse MD  Procedure: IV Infusion  IV Type: Peripheral, IV Location: R Antecubital  Entyvio (Vedolizumab), Dose: 300 mg  Infusion Start Time: 1137  Infusion Stop Time: 1210  Post Infusion IV Care: Observation period completed  Discharge: Condition: Good, Destination: Home . AVS Provided  Performed by:  Wyvonne Lenz, RN

## 2023-10-21 ENCOUNTER — Ambulatory Visit (INDEPENDENT_AMBULATORY_CARE_PROVIDER_SITE_OTHER)

## 2023-10-21 VITALS — BP 139/61 | HR 62 | Temp 97.4°F | Resp 18 | Ht 62.0 in | Wt 150.0 lb

## 2023-10-21 DIAGNOSIS — K5130 Ulcerative (chronic) rectosigmoiditis without complications: Secondary | ICD-10-CM | POA: Diagnosis not present

## 2023-10-21 MED ORDER — VEDOLIZUMAB 300 MG IV SOLR
300.0000 mg | Freq: Once | INTRAVENOUS | Status: AC
  Administered 2023-10-21: 300 mg via INTRAVENOUS
  Filled 2023-10-21: qty 5

## 2023-10-21 NOTE — Progress Notes (Signed)
 Diagnosis: Ulcerative Colitis  Provider:  Praveen Mannam MD  Procedure: IV Infusion  IV Type: Peripheral, IV Location: R Antecubital  Entyvio (Vedolizumab), Dose: 300 mg  Infusion Start Time: 1130  Infusion Stop Time: 1215  Post Infusion IV Care: Peripheral IV Discontinued  Discharge: Condition: Good, Destination: Home . AVS Provided  Performed by:  Levoy Geisen, RN

## 2023-10-26 DIAGNOSIS — E559 Vitamin D deficiency, unspecified: Secondary | ICD-10-CM | POA: Diagnosis not present

## 2023-10-26 DIAGNOSIS — I341 Nonrheumatic mitral (valve) prolapse: Secondary | ICD-10-CM | POA: Diagnosis not present

## 2023-10-26 DIAGNOSIS — E7800 Pure hypercholesterolemia, unspecified: Secondary | ICD-10-CM | POA: Diagnosis not present

## 2023-10-26 DIAGNOSIS — G4733 Obstructive sleep apnea (adult) (pediatric): Secondary | ICD-10-CM | POA: Diagnosis not present

## 2023-10-26 DIAGNOSIS — K5190 Ulcerative colitis, unspecified, without complications: Secondary | ICD-10-CM | POA: Diagnosis not present

## 2023-10-26 DIAGNOSIS — Z0000 Encounter for general adult medical examination without abnormal findings: Secondary | ICD-10-CM | POA: Diagnosis not present

## 2023-10-26 DIAGNOSIS — I10 Essential (primary) hypertension: Secondary | ICD-10-CM | POA: Diagnosis not present

## 2023-10-26 DIAGNOSIS — M810 Age-related osteoporosis without current pathological fracture: Secondary | ICD-10-CM | POA: Diagnosis not present

## 2023-10-26 DIAGNOSIS — Z9081 Acquired absence of spleen: Secondary | ICD-10-CM | POA: Diagnosis not present

## 2023-10-26 DIAGNOSIS — K219 Gastro-esophageal reflux disease without esophagitis: Secondary | ICD-10-CM | POA: Diagnosis not present

## 2023-10-27 ENCOUNTER — Other Ambulatory Visit: Payer: Self-pay | Admitting: Family Medicine

## 2023-10-27 DIAGNOSIS — E2839 Other primary ovarian failure: Secondary | ICD-10-CM

## 2023-10-27 DIAGNOSIS — M810 Age-related osteoporosis without current pathological fracture: Secondary | ICD-10-CM

## 2023-11-02 ENCOUNTER — Telehealth: Payer: Self-pay | Admitting: Gastroenterology

## 2023-11-02 ENCOUNTER — Other Ambulatory Visit (INDEPENDENT_AMBULATORY_CARE_PROVIDER_SITE_OTHER)

## 2023-11-02 DIAGNOSIS — K625 Hemorrhage of anus and rectum: Secondary | ICD-10-CM

## 2023-11-02 LAB — CBC WITH DIFFERENTIAL/PLATELET
Basophils Absolute: 0.1 10*3/uL (ref 0.0–0.1)
Basophils Relative: 1 % (ref 0.0–3.0)
Eosinophils Absolute: 0.2 10*3/uL (ref 0.0–0.7)
Eosinophils Relative: 1.7 % (ref 0.0–5.0)
HCT: 38.6 % (ref 36.0–46.0)
Hemoglobin: 13.9 g/dL (ref 12.0–15.0)
Lymphocytes Relative: 21.8 % (ref 12.0–46.0)
Lymphs Abs: 1.9 10*3/uL (ref 0.7–4.0)
MCHC: 36.1 g/dL — ABNORMAL HIGH (ref 30.0–36.0)
MCV: 92 fl (ref 78.0–100.0)
Monocytes Absolute: 1.7 10*3/uL — ABNORMAL HIGH (ref 0.1–1.0)
Monocytes Relative: 18.7 % — ABNORMAL HIGH (ref 3.0–12.0)
Neutro Abs: 5.1 10*3/uL (ref 1.4–7.7)
Neutrophils Relative %: 56.8 % (ref 43.0–77.0)
Platelets: 204 10*3/uL (ref 150.0–400.0)
RBC: 4.19 Mil/uL (ref 3.87–5.11)
RDW: 14.9 % (ref 11.5–15.5)
WBC: 8.9 10*3/uL (ref 4.0–10.5)

## 2023-11-02 NOTE — Telephone Encounter (Signed)
 Sorry to hear this.  Thanks for letting me know.  If she was passing just blood and clots, I wonder if she had a diverticular bleed, she has had diverticulosis in her left colon on prior colonoscopy.  Assuming she did not feel like she was having a colitis flare?  We just recently started her on Entyvio , I believe she has had an infusion at least once so far?  As long as the bleeding has resolved, that is reassuring.  I would like her to come to the office for a CBC to check her blood counts to make sure stable.  If she has recurrent bleeding in the interim she needs to let me know, I will let her know what her blood work shows.  Can you clarify whether if she feels that her colitis is well-controlled currently, and how many Entyvio  infusions she has had?  Thank you

## 2023-11-02 NOTE — Telephone Encounter (Signed)
 Patient advised of recommendation as per Dr General Kenner. She will come for CBC today. Patient says she does not feel that her colitis is "flaring" and says she has had 2 entyvio  infusion.

## 2023-11-02 NOTE — Telephone Encounter (Signed)
 Inbound call from patient states last Wednesday afternoon she started bleeding. Bleeding occurred for approximately 12 hours. Patient states she did not go to the emergency room.   Patient states from Wednesday afternoon she went to the bathroom every hour on the hour and each time she had blood in the toilet.   At the moment patient states symptoms have resolved Requesting t speak with a nurse. Please advise.   Thank you

## 2023-11-02 NOTE — Telephone Encounter (Signed)
 Patient states that beginning Wednesday, she had multiple large volume bloody bowel movements with watery diarrhea and blood clots x 12 hours. States she was going to the bathroom "every hour on the hour." Says she also experienced intermittent mid-abdominal pain (dull,achy). She indicates she had abdominal distention but says this is now slowly improving. She is no longer having watery diarrhea nor blood in the stool (stopped Saturday). She does say that she experienced dry heaves but had nothing on her stomach as she was unable to eat. This has also resolved. She notes that she has dizziness and shortness of breath, although she does not feel that the SOB is related since she had this previously (and was scheduled for echo by internist).  Dr General Kenner, please advise.

## 2023-11-05 ENCOUNTER — Other Ambulatory Visit (HOSPITAL_COMMUNITY): Payer: Self-pay | Admitting: Family Medicine

## 2023-11-05 DIAGNOSIS — R0609 Other forms of dyspnea: Secondary | ICD-10-CM

## 2023-11-09 ENCOUNTER — Telehealth: Payer: Self-pay | Admitting: Gastroenterology

## 2023-11-09 ENCOUNTER — Other Ambulatory Visit: Payer: Self-pay | Admitting: Gastroenterology

## 2023-11-09 ENCOUNTER — Telehealth: Payer: Self-pay

## 2023-11-09 ENCOUNTER — Other Ambulatory Visit (INDEPENDENT_AMBULATORY_CARE_PROVIDER_SITE_OTHER)

## 2023-11-09 ENCOUNTER — Other Ambulatory Visit

## 2023-11-09 ENCOUNTER — Ambulatory Visit: Admitting: Gastroenterology

## 2023-11-09 VITALS — BP 122/80 | HR 66 | Ht 62.0 in | Wt 146.0 lb

## 2023-11-09 DIAGNOSIS — R195 Other fecal abnormalities: Secondary | ICD-10-CM | POA: Diagnosis not present

## 2023-11-09 DIAGNOSIS — K5130 Ulcerative (chronic) rectosigmoiditis without complications: Secondary | ICD-10-CM | POA: Diagnosis not present

## 2023-11-09 DIAGNOSIS — N939 Abnormal uterine and vaginal bleeding, unspecified: Secondary | ICD-10-CM | POA: Diagnosis not present

## 2023-11-09 DIAGNOSIS — R197 Diarrhea, unspecified: Secondary | ICD-10-CM

## 2023-11-09 DIAGNOSIS — R300 Dysuria: Secondary | ICD-10-CM

## 2023-11-09 DIAGNOSIS — K648 Other hemorrhoids: Secondary | ICD-10-CM | POA: Diagnosis not present

## 2023-11-09 DIAGNOSIS — N390 Urinary tract infection, site not specified: Secondary | ICD-10-CM

## 2023-11-09 DIAGNOSIS — R1030 Lower abdominal pain, unspecified: Secondary | ICD-10-CM

## 2023-11-09 DIAGNOSIS — R102 Pelvic and perineal pain: Secondary | ICD-10-CM

## 2023-11-09 LAB — COMPREHENSIVE METABOLIC PANEL WITH GFR
ALT: 16 U/L (ref 0–35)
AST: 20 U/L (ref 0–37)
Albumin: 3.7 g/dL (ref 3.5–5.2)
Alkaline Phosphatase: 58 U/L (ref 39–117)
BUN: 10 mg/dL (ref 6–23)
CO2: 27 meq/L (ref 19–32)
Calcium: 9.3 mg/dL (ref 8.4–10.5)
Chloride: 105 meq/L (ref 96–112)
Creatinine, Ser: 0.55 mg/dL (ref 0.40–1.20)
GFR: 91.3 mL/min (ref >60.00)
Glucose, Bld: 133 mg/dL — ABNORMAL HIGH (ref 70–99)
Potassium: 3.9 meq/L (ref 3.5–5.1)
Sodium: 138 meq/L (ref 135–145)
Total Bilirubin: 1.8 mg/dL — ABNORMAL HIGH (ref 0.2–1.2)
Total Protein: 6.8 g/dL (ref 6.0–8.3)

## 2023-11-09 LAB — URINALYSIS WITH CULTURE, IF INDICATED
Ketones, ur: NEGATIVE
Nitrite: NEGATIVE
Specific Gravity, Urine: 1.015 (ref 1.000–1.030)
Total Protein, Urine: 30 — AB
Urine Glucose: NEGATIVE
Urobilinogen, UA: 0.2 (ref 0.0–1.0)
pH: 6 (ref 5.0–8.0)

## 2023-11-09 LAB — CBC WITH DIFFERENTIAL/PLATELET
Basophils Absolute: 0.3 10*3/uL — ABNORMAL HIGH (ref 0.0–0.1)
Basophils Relative: 1.4 % (ref 0.0–3.0)
Eosinophils Absolute: 0.1 10*3/uL (ref 0.0–0.7)
Eosinophils Relative: 0.6 % (ref 0.0–5.0)
HCT: 38.5 % (ref 36.0–46.0)
Hemoglobin: 13.6 g/dL (ref 12.0–15.0)
Lymphocytes Relative: 13.3 % (ref 12.0–46.0)
Lymphs Abs: 2.5 10*3/uL (ref 0.7–4.0)
MCHC: 35.2 g/dL (ref 30.0–36.0)
MCV: 93.3 fl (ref 78.0–100.0)
Monocytes Absolute: 1.2 10*3/uL — ABNORMAL HIGH (ref 0.1–1.0)
Monocytes Relative: 6.2 % (ref 3.0–12.0)
Neutro Abs: 14.9 10*3/uL — ABNORMAL HIGH (ref 1.4–7.7)
Neutrophils Relative %: 78.5 % — ABNORMAL HIGH (ref 43.0–77.0)
Platelets: 507 10*3/uL — ABNORMAL HIGH (ref 150.0–400.0)
RBC: 4.12 Mil/uL (ref 3.87–5.11)
RDW: 14.7 % (ref 11.5–15.5)
WBC: 19 10*3/uL (ref 4.0–10.5)

## 2023-11-09 MED ORDER — CIPROFLOXACIN HCL 500 MG PO TABS: 500.0000 mg | ORAL_TABLET | Freq: Two times a day (BID) | ORAL | 0 refills | Status: DC

## 2023-11-09 NOTE — Telephone Encounter (Signed)
 Thank you Dottie!

## 2023-11-09 NOTE — Telephone Encounter (Signed)
 Patient called in around 630 - woke up to urinate at 230 AM or so, noted blood per rectum with severe rectal discomfort. The rectal discomfort has persisted since then and very uncomfortable. She has also passed some blood per rectum recently. She remains uncomfortable. Has never had hemorrhoids before, or an anal fissure.   Symptoms concerning for possible anal fissure but hard to say, she needs a DRE in the office and exam. I am not in the office for clinic next 2 days or so.   POD A RN can you see if one of the [facility] have an opening today or tomorrow to see her for an acute visit for rectal pain? Can you let me know? thanks

## 2023-11-09 NOTE — Telephone Encounter (Signed)
 FYI - I think Bayley is gone for the day

## 2023-11-09 NOTE — Progress Notes (Signed)
 Hannah Willis thanks very much for your help seeing this patient.  I appreciate you clarifying her bleeding, I was under the impression it was coming from her colon but sounds like it is coming from her vaginal area.  She definitely needs to see GYN as scheduled and will await her imaging otherwise.  Thank you.

## 2023-11-09 NOTE — Progress Notes (Signed)
 Chief Complaint: Vaginal bleeding Primary GI MD: Dr. General Kenner  HPI: 73 year old female with history of ulcerative colitis presents for evaluation of vaginal bleeding  Seen by Dr. General Kenner follow-up last seen 09/16/2023.  At that time patient was on highest dose oral mesalamine  and Rowasa  enemas daily and although this regimen controlled her symptoms she continued to have persistently mildly elevated fecal calprotectin and has not been on endoscopic remission on colonoscopy.  Ultimately Dr. General Kenner switched her to Entyvio .  Patient called in 11/02/2023 with vaginal bleeding.  She was brought in for CBC with no anemia.  She states that her last night she began having symptoms again prompting her visit today.  Denies rectal bleeding.  History of Present Illness Vaginal bleeding began on Nov 09, 2023, at approximately 2:30 AM, described as 'blood red' with initial blood clots, continuing for several hours before tapering off to occasional clots. She notes this has been coming from her vagina.  She experiences associated lower abdominal and pelvic pain, described as sharp and akin to 'somebody sticking a knife' into her vagina, with additional pain in her lower back and left side of her abdomen.  Increased frequency of urination with occasional burning sensation started around the same time as the bleeding.  She has a history of ulcerative colitis and was recently switched from mesalamine  to Entyvio , with two infusions completed, the most recent two weeks ago.  She has frequent bowel movements with small amounts of stool, a change noted over the past couple of weeks.  She experiences more frequent headaches recently, for which she takes Advil, finding it more effective than Tylenol .   PREVIOUS GI WORKUP   Colonoscopy 02/25/22: The perianal and digital rectal examinations were normal. - The terminal ileum appeared normal. - Multiple small-mouthed diverticula were found in the sigmoid  colon. - Areas of mildly erythematous mucosa was found in the recto-sigmoid colon and in the distal sigmoid colon near areas of diverticulum. Unclear if this represents segmental / diverticular related colitis more so than ulcerative colitis? Biopsies were taken with a cold forceps for histology. - The exam was otherwise without abnormality. - Biopsies were taken with a cold forceps in the rectum for histology.   1. Surgical [P], colon, sigmoid/recto-sigmoid - MINIMALLY ACTIVE CHRONIC COLITIS CONSISTENT WITH INFLAMMATORY BOWEL DISEASE. - NEGATIVE FOR DYSPLASIA. 2. Surgical [P], colon, rectum - MILDLY ACTIVE CHRONIC COLITIS CONSISTENT WITH INFLAMMATORY BOWEL DISEASE. - NEGATIVE FOR DYSPLASIA.     Colonoscopy 11/28/19: The perianal and digital rectal examinations were normal. - Multiple small-mouthed diverticula were found in the sigmoid colon and ascending colon. - A small area of granular mucosa with loss of vascularity - mild was found in the rectosigmoid colon in an area of diverticulosis - unclear if superficial changes from diverticulitis vs. mild active coliis. - Internal hemorrhoids were found during retroflexion. The hemorrhoids were small. - The exam was otherwise without abnormality. No other areas of inflammation appreciated. No polyps. - Biopsies were taken with a cold forceps in the rectum, in the sigmoid colon, in the descending colon and at the splenic flexure for histology.   1. Surgical [P], random left colon bx - FOCAL EROSION WITH MILD INFLAMMATION. - NEGATIVE FOR DYSPLASIA. 2. Surgical [P], colon, rectum/rectosigmoid - MINIMALLY ACTIVE CHRONIC COLITIS. - NEGATIVE FOR DYSPLASIA. - NO GRANULOMAS.     Colonoscopy 09/10/2017 - Preparation of the colon was fair, could not clear the cecal cap. - The examined portion of the ileum was normal. - One 4 mm  polyp in the transverse colon, removed with a cold snare. Resected and retrieved. - Diverticulosis in the sigmoid  colon. - The examination was otherwise normal on direct and retroflexion views. No active inflammation. - Biopsies for surveillance were taken from the descending colon, sigmoid colon and rectum   Path showed focal active colitis, adenomatous polyp   Colonoscopy December 2015 - left sided colitis, path c/w chronic active colitis   Fecal calprotectin 03/2023: 192 - increased lialda  back to 4 tabs per day and continued Rowasa    Fecal calprotectin 06/24/23: 123   Past Medical History:  Diagnosis Date   Allergy    Apnea, sleep    no c-pap machine   Arthritis    OA   Basal cell carcinoma    on front chest   Blood in stool    LAST 2 MONTHS, MEDS CHNAGED FOR   GERD (gastroesophageal reflux disease)    Heart murmur    Hyperlipidemia    Hypertension    Kidney stones    Laryngitis    started monday night   Neuromuscular disorder (HCC)    Post-operative nausea and vomiting    Stroke (HCC) 09/21/2007   Synovitis of right hand 12/29/2015   and wrist   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   Ulcerative colitis (HCC)    Weakness of right hand    due to stroke    Past Surgical History:  Procedure Laterality Date   COLONSCOPY     SHOULDER ARTHROSCOPY W/ ROTATOR CUFF REPAIR Right 2010   SPLENECTOMY, 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     aspirin 325 MG tablet 1 tablet     calcium carbonate (OSCAL) 1500 (600 Ca) MG TABS tablet Take by mouth 2 (two) times daily with a meal.     Cholecalciferol (VITAMIN D  PO) Take 1 tablet by mouth daily.     diphenhydrAMINE  (BENADRYL  ALLERGY) 25 mg capsule Take 25 mg by mouth  daily.     ferrous sulfate  325 (65 FE) MG tablet Take 1 tablet (325 mg total) by mouth 3 (three) times daily after meals.  3   losartan (COZAAR) 50 MG tablet Take 50 mg by mouth daily.     metoprolol  succinate (TOPROL -XL) 50 MG 24 hr tablet Take 50 mg by mouth daily. Take with or immediately following a meal.     rosuvastatin (CRESTOR) 20 MG tablet Take 20 mg by mouth daily.     tamsulosin  (FLOMAX ) 0.4 MG CAPS capsule Take 1 capsule (0.4 mg total) by mouth daily. 30 capsule 0   thiamine (VITAMIN B-1) 100 MG tablet Take 100 mg by mouth daily.     alendronate (FOSAMAX) 70 MG tablet Take 70 mg by mouth once a week. Take with a full glass of water  on an empty stomach. (Patient not taking: Reported on 11/09/2023)     AMBULATORY NON FORMULARY MEDICATION Panothetic Acid B5 Take 1 tablet by mouth once daily (Patient not taking: Reported on 11/09/2023)     Cyanocobalamin (VITAMIN B-12 PO) Take 1 tablet by mouth daily. (Patient not taking: Reported on 09/16/2023)  dicyclomine  (BENTYL ) 10 MG capsule Take 1 capsule (10 mg total) by mouth every 8 (eight) hours as needed for spasms. (Patient not taking: Reported on 11/09/2023) 30 capsule 1   mesalamine  (LIALDA ) 1.2 g EC tablet Take 4 tablets (4.8 g total) by mouth daily with breakfast. (Patient not taking: Reported on 11/09/2023) 360 tablet 1   mesalamine  (ROWASA ) 4 g enema Place 60 mLs (4 g total) rectally at bedtime. Please schedule a yearly follow up for further refills. Thank you (Patient not taking: Reported on 11/09/2023) 5400 mL 1   ondansetron  (ZOFRAN -ODT) 8 MG disintegrating tablet Take 1 tablet (8 mg total) by mouth every 8 (eight) hours as needed for nausea or vomiting. (Patient not taking: Reported on 11/09/2023) 20 tablet 0   No current facility-administered medications for this visit.    Allergies as of 11/09/2023 - Review Complete 11/09/2023  Allergen Reaction Noted   Clindamycin hcl Other (See Comments) 09/24/2021   Codeine Nausea And Vomiting  03/20/2009   Morphine  and codeine  10/06/2011    Family History  Problem Relation Age of Onset   Diabetes Father    Mental illness Father    Narcolepsy Father    Heart disease Paternal Grandmother    Diabetes Paternal Grandmother    Heart attack Maternal Grandfather    Stroke Maternal Grandmother    Colon cancer Neg Hx    Esophageal cancer Neg Hx    Rectal cancer Neg Hx    Stomach cancer Neg Hx     Social History   Socioeconomic History   Marital status: Married    Spouse name: Not on file   Number of children: 0   Years of education: Not on file   Highest education level: Not on file  Occupational History   Occupation: phone sales  Tobacco Use   Smoking status: Former    Current packs/day: 0.00    Types: Cigarettes    Quit date: 06/20/1989    Years since quitting: 34.4   Smokeless tobacco: Never   Tobacco comments:    smoked occasional cigarettes for 5 years  Vaping Use   Vaping status: Never Used  Substance and Sexual Activity   Alcohol use: No    Alcohol/week: 0.0 standard drinks of alcohol   Drug use: No   Sexual activity: Not on file  Other Topics Concern   Not on file  Social History Narrative   Not on file   Social Drivers of Health   Financial Resource Strain: Not on file  Food Insecurity: Not on file  Transportation Needs: Not on file  Physical Activity: Not on file  Stress: Not on file  Social Connections: Not on file  Intimate Partner Violence: Not on file    Review of Systems:    Constitutional: No weight loss, fever, chills, weakness or fatigue HEENT: Eyes: No change in vision               Ears, Nose, Throat:  No change in hearing or congestion Skin: No rash or itching Cardiovascular: No chest pain, chest pressure or palpitations   Respiratory: No SOB or cough Gastrointestinal: See HPI and otherwise negative Genitourinary: No dysuria or change in urinary frequency Neurological: No headache, dizziness or syncope Musculoskeletal: No  new muscle or joint pain Hematologic: No bleeding or bruising Psychiatric: No history of depression or anxiety    Physical Exam:  Vital signs: BP 122/80   Pulse 66   Ht 5\' 2"  (1.575 m)   Wt 146  lb (66.2 kg)   BMI 26.70 kg/m   Constitutional: NAD, alert and cooperative Head:  Normocephalic and atraumatic. Eyes:   PEERL, EOMI. No icterus. Conjunctiva pink. Respiratory: Respirations even and unlabored. Lungs clear to auscultation bilaterally.   No wheezes, crackles, or rhonchi.  Cardiovascular:  Regular rate and rhythm. No peripheral edema, cyanosis or pallor.  Gastrointestinal:  Soft, nondistended, nontender. No rebound or guarding. Normal bowel sounds. No appreciable masses or hepatomegaly. Rectal:  Trevor Fudge CMA chaperone. No external hemorrhoids or fissures. Normal sphincter tone. Hemorrhoid on DRE Msk:  Symmetrical without gross deformities. Without edema, no deformity or joint abnormality.  Neurologic:  Alert and  oriented x4;  grossly normal neurologically.  Skin:   Dry and intact without significant lesions or rashes. Psychiatric: Oriented to person, place and time. Demonstrates good judgement and reason without abnormal affect or behaviors.  ANOSCOPY No fissures, LL internal hemorrhoid moderate sized, no tenderness, normal mucosa  RELEVANT LABS AND IMAGING: CBC    Component Value Date/Time   WBC 8.9 11/02/2023 1455   RBC 4.19 11/02/2023 1455   HGB 13.9 11/02/2023 1455   HGB 14.2 11/04/2021 1357   HGB 14.2 04/04/2015 1010   HCT 38.6 11/02/2023 1455   HCT 40.2 04/04/2015 1010   PLT 204.0 11/02/2023 1455   PLT 264 11/04/2021 1357   PLT 366 04/04/2015 1010   MCV 92.0 11/02/2023 1455   MCV 91 04/04/2015 1010   MCH 32.7 04/02/2022 1805   MCHC 36.1 (H) 11/02/2023 1455   RDW 14.9 11/02/2023 1455   RDW 13.2 04/04/2015 1010   LYMPHSABS 1.9 11/02/2023 1455   LYMPHSABS 2.5 04/04/2015 1010   MONOABS 1.7 (H) 11/02/2023 1455   EOSABS 0.2 11/02/2023 1455   EOSABS 0.4  04/04/2015 1010   BASOSABS 0.1 11/02/2023 1455   BASOSABS 0.2 04/04/2015 1010    CMP     Component Value Date/Time   NA 135 04/02/2022 1805   NA 137 04/04/2015 1010   K 3.9 04/02/2022 1805   CL 101 04/02/2022 1805   CO2 22 04/02/2022 1805   GLUCOSE 118 (H) 04/02/2022 1805   BUN 14 04/02/2022 1805   BUN 10 04/04/2015 1010   CREATININE 0.75 04/02/2022 1805   CREATININE 0.65 11/04/2021 1357   CALCIUM 9.4 04/02/2022 1805   PROT 6.9 04/02/2022 1805   PROT 6.3 04/04/2015 1010   ALBUMIN 4.1 04/02/2022 1805   ALBUMIN 4.1 04/04/2015 1010   AST 23 04/02/2022 1805   AST 25 11/04/2021 1357   ALT 14 04/02/2022 1805   ALT 18 11/04/2021 1357   ALKPHOS 56 04/02/2022 1805   BILITOT 0.8 04/02/2022 1805   BILITOT 0.9 11/04/2021 1357   GFRNONAA >60 04/02/2022 1805   GFRNONAA >60 11/04/2021 1357   GFRAA >60 01/01/2016 0438     Assessment/Plan:   Vaginal bleeding Dysuria Increased vaginal bleeding ongoing for hours starting at 2:30 AM tapering off.  Occasional clots with associated dysuria.  She clarifies she has not had any rectal bleeding and has never had rectal bleeding.  Associated dysuria as well.  Scheduled to see GYN next week.  Sharp knifelike pain in pelvic area. - UA - CBC/CMP - CT abdomen pelvis with contrast - Continue to follow-up with GYN - If chest pain or shortness of breath, syncope, profuse bleeding please proceed to ED  Ulcerative rectosigmoiditis Prior medication includes mesalamine  and Rowasa  enemas daily which previously kept her symptoms controlled but she continued to have mildly elevated fecal calprotectin and has not been on  endoscopic remission on colonoscopy.  Switched to Entyvio  currently s/p 2 infusions.  Having loose stools over the last 2 weeks and fecal incontinence.  Unremarkable rectal exam other than internal hemorrhoid. - CBC/CMP/CRP - Performed Diatherix in office to rule out infectious etiology (GI pathogen and Diatherix) - Continue Entyvio   infusion and reassess with colonoscopy in 6 to 9 months to assess for mucosal healing - Will also be able to evaluate with CT scan   Gigi Kyle [facility] 11/09/2023, 3:28 PM  Cc: Faustina Hood, MD

## 2023-11-09 NOTE — Telephone Encounter (Signed)
 The lab called with a critical WBC of 19.0

## 2023-11-09 NOTE — Telephone Encounter (Signed)
 Patient is scheduled to see Suzanna Erp, PA-C today, 11/09/23. Patient verbalizes understanding.

## 2023-11-09 NOTE — Patient Instructions (Signed)
 Your provider has requested that you go to the basement level for lab work before leaving today. Press "B" on the elevator. The lab is located at the first door on the left as you exit the elevator.   You have been scheduled for a CT scan of the abdomen and pelvis at [facility], 1st floor Radiology. You are scheduled on 11/13/23 at 2:30 pm. You should arrive 15 minutes prior to your appointment time for registration.  Please follow the written instructions below on the day of your exam:   1) Do not eat anything after 10:30 am (4 hours prior to your test)   You may take any medications as prescribed with a small amount of water , if necessary. If you take any of the following medications: METFORMIN, GLUCOPHAGE, GLUCOVANCE, AVANDAMET, RIOMET, FORTAMET, ACTOPLUS MET, JANUMET, GLUMETZA or METAGLIP, you MAY be asked to HOLD this medication 48 hours AFTER the exam.   The purpose of you drinking the oral contrast is to aid in the visualization of your intestinal tract. The contrast solution may cause some diarrhea. Depending on your individual set of symptoms, you may also receive an intravenous injection of x-ray contrast/dye. Plan on being at [facility] for 45 minutes or longer, depending on the type of exam you are having performed.   If you have any questions regarding your exam or if you need to reschedule, you may call [facility] at [phone] between the hours of 8:00 am and 5:00 pm, Monday-Friday.   _______________________________________________________  If your blood pressure at your visit was 140/90 or greater, please contact your primary care physician to follow up on this.  _______________________________________________________  If you are age 56 or older, your body mass index should be between 23-30. Your Body mass index is 26.7 kg/m. If this is out of the aforementioned range listed, please consider follow up with your Primary Care Provider.  If you are age 52 or  younger, your body mass index should be between 19-25. Your Body mass index is 26.7 kg/m. If this is out of the aformentioned range listed, please consider follow up with your Primary Care Provider.   ________________________________________________________  The [facility] providers would like to encourage you to use MYCHART to communicate with providers for non-urgent requests or questions.  Due to long hold times on the telephone, sending your provider a message by [facility] may be a faster and more efficient way to get a response.  Please allow 48 business hours for a response.  Please remember that this is for non-urgent requests.  _______________________________________________________

## 2023-11-09 NOTE — Telephone Encounter (Signed)
 I am just seeing this message now, I called the patient.  I appreciate Bayley seeing Hannah Willis today.  When she initially called I was under the impression she had rectal bleeding, following the visit with you it became clear that she was having vaginal bleeding and issues with urination  She has a significant leukocytosis of 19 and what looks to be a UTI, perhaps she has hemorrhagic cystitis.  Recommend we start her on antibiotics tonight, will start Cipro  500 mg twice daily for 5 days course.  However, I do recommend she contact her primary care in the morning for further advice and to determine if they want to continue this regimen or treat her with something else.  She has pending imaging and a pending visit with gynecology as well.  Patient understands and will go to the pharmacy tonight.  If she has fevers or interval worsening she will need to go to the hospital.

## 2023-11-12 LAB — URINALYSIS W MICROSCOPIC + REFLEX CULTURE

## 2023-11-13 ENCOUNTER — Telehealth: Payer: Self-pay | Admitting: Gastroenterology

## 2023-11-13 ENCOUNTER — Ambulatory Visit (HOSPITAL_COMMUNITY)
Admission: RE | Admit: 2023-11-13 | Discharge: 2023-11-13 | Disposition: A | Discharge status: Home / Self Care | Source: Ambulatory Visit | Attending: Gastroenterology

## 2023-11-13 DIAGNOSIS — R1030 Lower abdominal pain, unspecified: Secondary | ICD-10-CM | POA: Diagnosis not present

## 2023-11-13 DIAGNOSIS — Z9081 Acquired absence of spleen: Secondary | ICD-10-CM | POA: Diagnosis not present

## 2023-11-13 DIAGNOSIS — Z9049 Acquired absence of other specified parts of digestive tract: Secondary | ICD-10-CM | POA: Diagnosis not present

## 2023-11-13 DIAGNOSIS — N281 Cyst of kidney, acquired: Secondary | ICD-10-CM | POA: Diagnosis not present

## 2023-11-13 MED ORDER — IOHEXOL 300 MG/ML  SOLN
100.0000 mL | Freq: Once | INTRAMUSCULAR | Status: AC | PRN
  Administered 2023-11-13: 100 mL via INTRAVENOUS

## 2023-11-13 MED ORDER — IOHEXOL 9 MG/ML PO SOLN
ORAL | Status: AC
  Filled 2023-11-13: qty 1000

## 2023-11-13 MED ORDER — IOHEXOL 9 MG/ML PO SOLN
1000.0000 mL | ORAL | Status: AC
  Administered 2023-11-13: 1000 mL via ORAL

## 2023-11-13 NOTE — Telephone Encounter (Signed)
 Patient advised of negative diatherix h pylori sample. She verbalizes understanding.

## 2023-11-13 NOTE — Telephone Encounter (Signed)
 Please inform patient that her H. pylori Diatherix is negative for any type of infection

## 2023-11-18 ENCOUNTER — Ambulatory Visit (INDEPENDENT_AMBULATORY_CARE_PROVIDER_SITE_OTHER): Admitting: *Deleted

## 2023-11-18 VITALS — BP 146/58 | HR 70 | Temp 98.6°F | Resp 16 | Ht 62.0 in | Wt 146.4 lb

## 2023-11-18 DIAGNOSIS — K5130 Ulcerative (chronic) rectosigmoiditis without complications: Secondary | ICD-10-CM | POA: Diagnosis not present

## 2023-11-18 MED ORDER — VEDOLIZUMAB 300 MG IV SOLR
300.0000 mg | Freq: Once | INTRAVENOUS | Status: AC
  Administered 2023-11-18: 300 mg via INTRAVENOUS
  Filled 2023-11-18: qty 5

## 2023-11-18 NOTE — Progress Notes (Signed)
 Diagnosis: ulcerative rectosigmoiditis  Provider:  Mannam, Praveen MD  Procedure: IV Infusion  IV Type: Peripheral, IV Location: L Antecubital  Entyvio  (Vedolizumab ), Dose: 300 mg  Infusion Start Time: 1135 am  Infusion Stop Time: 1212 pm  Post Infusion IV Care: Observation period completed and Peripheral IV Discontinued  Discharge: Condition: Good, Destination: Home . AVS Provided

## 2023-11-20 ENCOUNTER — Encounter: Payer: Self-pay | Admitting: Gastroenterology

## 2023-11-23 DIAGNOSIS — Z1231 Encounter for screening mammogram for malignant neoplasm of breast: Secondary | ICD-10-CM | POA: Diagnosis not present

## 2023-12-07 ENCOUNTER — Ambulatory Visit (HOSPITAL_COMMUNITY)
Admission: RE | Admit: 2023-12-07 | Discharge: 2023-12-07 | Disposition: A | Discharge status: Home / Self Care | Source: Ambulatory Visit | Attending: Cardiovascular Disease | Admitting: Cardiovascular Disease

## 2023-12-07 DIAGNOSIS — R0609 Other forms of dyspnea: Secondary | ICD-10-CM

## 2023-12-07 LAB — ECHOCARDIOGRAM COMPLETE
Area-P 1/2: 4.08 cm2
P 1/2 time: 276 ms
S' Lateral: 4.1 cm

## 2023-12-11 DIAGNOSIS — H903 Sensorineural hearing loss, bilateral: Secondary | ICD-10-CM | POA: Diagnosis not present

## 2023-12-21 ENCOUNTER — Ambulatory Visit: Attending: Cardiology | Admitting: Cardiology

## 2023-12-21 ENCOUNTER — Other Ambulatory Visit: Payer: Self-pay

## 2023-12-21 ENCOUNTER — Encounter: Payer: Self-pay | Admitting: Cardiology

## 2023-12-21 VITALS — BP 144/56 | HR 69 | Ht 62.0 in | Wt 145.0 lb

## 2023-12-21 DIAGNOSIS — I351 Nonrheumatic aortic (valve) insufficiency: Secondary | ICD-10-CM | POA: Insufficient documentation

## 2023-12-21 DIAGNOSIS — I059 Rheumatic mitral valve disease, unspecified: Secondary | ICD-10-CM

## 2023-12-21 DIAGNOSIS — I340 Nonrheumatic mitral (valve) insufficiency: Secondary | ICD-10-CM | POA: Diagnosis not present

## 2023-12-21 DIAGNOSIS — R079 Chest pain, unspecified: Secondary | ICD-10-CM | POA: Insufficient documentation

## 2023-12-21 DIAGNOSIS — R0600 Dyspnea, unspecified: Secondary | ICD-10-CM | POA: Diagnosis not present

## 2023-12-21 HISTORY — DX: Nonrheumatic aortic (valve) insufficiency: I35.1

## 2023-12-21 MED ORDER — ASPIRIN 81 MG PO TBEC: 81.0000 mg | DELAYED_RELEASE_TABLET | Freq: Every day | ORAL | Status: DC

## 2023-12-21 NOTE — Progress Notes (Addendum)
 Cardiology Office Note:  .   Date:  12/21/2023  ID:  Hannah Willis, DOB September 23, 1950, MRN [id number] PCP: Claudene Pellet, MD  [facility] Providers Cardiologist:  Newman Lawrence, MD PCP: Claudene Pellet, MD  Chief Complaint  Patient presents with   exertional dyspnea     Hannah Willis is a 73 y.o. female with hypertension, hyperlipidemia, remote h/o stroke, s/p splenectomy for hereditary spherocytosis, mitral and aortic valve regurgitation, exertional dyspnea  Discussed the use of AI scribe software for clinical note transcription with the patient, who gave verbal consent to proceed.  History of Present Illness Hannah Willis is a 73 year old female with a history of stroke and mitral valve prolapse who presents with shortness of breath and chest tightness.  She experiences shortness of breath and chest tightness primarily during physical exertion, such as walking uphill. These symptoms have progressively worsened over the past couple of years. The chest tightness is described as a heaviness that occurs mainly when walking and sometimes when lying in bed, lasting for seconds and resolving on its own. She also experiences difficulty breathing when walking.  She has a history of a stroke 17 years ago 2 days after losing her mother, with blood pressures 200/100 mmHg.  She had right-sided loss of sensation, now only has mild residual loss of sensation in 2 fingers. She has been taking aspirin  325 mg daily since the stroke, except for a brief period when she was on a lower dose. She also takes losartan 50 mg, metoprolol  50 mg, and rosuvastatin 20 mg.  She reports swelling in her legs, which is more pronounced at times, and occasional lightheadedness but denies any episodes of passing out. She quit smoking and drinking nearly 30 years ago.  She has a history of mitral valve prolapse, which she recalls was very mild for many years and then started getting a little  bit stronger. Her last echocardiogram was in 2011.      Vitals:   12/21/23 1434  BP: (!) 144/56  Pulse: 69  SpO2: 97%      Review of Systems  Cardiovascular:  Positive for dyspnea on exertion and leg swelling. Negative for chest pain, palpitations and syncope.        Studies Reviewed: SABRA        EKG 12/21/2023: Sinus rhythm with occasional Premature ventricular complexes Minimal voltage criteria for LVH, may be normal variant ( Cornell product ) Nonspecific ST and T wave abnormality  Echocardiogram 12/07/2023:  1. Left ventricular ejection fraction, by estimation, is 45 to 50%. The  left ventricle has mildly decreased function. The left ventricle  demonstrates global hypokinesis. The left ventricular internal cavity size  was mildly dilated. There is mild eccentric left ventricular hypertrophy.  Left ventricular diastolic parameters are consistent with Grade II diastolic dysfunction  (pseudonormalization). Elevated left atrial pressure.   2. Right ventricular systolic function is normal. The right ventricular  size is normal. There is normal pulmonary artery systolic pressure. The  estimated right ventricular systolic pressure is 35.3 mmHg.   3. Left atrial size was severely dilated.   4. 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 No evidence of mitral stenosis. There is severe  holosystolic prolapse of the middle segment of the anterior leaflet of the  mitral valve.   5. The aortic valve is tricuspid. Aortic valve regurgitation is moderate  to severe. No aortic stenosis is present. Aortic regurgitation PHT  measures 276 msec.  6. Aortic dilatation noted. There is moderate dilatation of the aortic  root, measuring 45 mm. There is mild dilatation of the ascending aorta,  measuring 41 mm.   7. The inferior vena cava is normal in size with greater than 50%  respiratory variability, suggesting right atrial pressure of 3 mmHg.    Comparison(s): Prior images unable to be directly viewed, comparison made  by report only. Aortic insufficiency is a new dominant valvular  abnormality and the left ventricle has dilated and has reduced systolic  fumnction. 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   Independently interpreted 11/2023: Chol 126, TG 49, HDL 65, LDL 50 HbA1C 5% Hb 13.5, WBC 19k Cr 0.55, T.bili 1.8 TSH 0.8   Physical Exam Vitals and nursing note reviewed.  Constitutional:      General: She is not in acute distress. Neck:     Vascular: No JVD.   Cardiovascular:     Rate and Rhythm: Normal rate and regular rhythm.     Heart sounds: Murmur heard.     High-pitched blowing holosystolic murmur is present with a grade of 2/6 at the apex.     High-pitched blowing decrescendo early diastolic murmur is present with a grade of 3/4 at the upper right sternal border radiating to the apex.  Pulmonary:     Effort: Pulmonary effort is normal.     Breath sounds: Normal breath sounds. No wheezing or rales.   Musculoskeletal:     Right lower leg: No edema.     Left lower leg: No edema.      VISIT DIAGNOSES:   ICD-10-CM   1. MITRAL VALVE PROLAPSE  I05.9 EKG 12-Lead    2. Nonrheumatic aortic valve insufficiency  I35.1 Basic metabolic panel with GFR    Pro b natriuretic peptide (BNP)    CBC    CANCELED: CBC    CANCELED: Basic metabolic panel with GFR    CANCELED: Pro b natriuretic peptide (BNP)    3. Nonrheumatic mitral valve regurgitation  I34.0 Basic metabolic panel with GFR    Pro b natriuretic peptide (BNP)    CBC    CANCELED: CBC    CANCELED: Basic metabolic panel with GFR    CANCELED: Pro b natriuretic peptide (BNP)    4. Exertional chest pain  R07.9        Hannah Willis is a 73 y.o. female with  hypertension, hyperlipidemia, remote h/o stroke, s/p splenectomy for hereditary spherocytosis, mitral and aortic valve regurgitation, exertional dyspnea Assessment &  Plan Exertional chest tightness, dyspnea: I personally reviewed patient's recent echocardiogram from 12/2023.  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  While there is severe prolapse of the anterior mitral valve leaflet, mitral regurgitation does not appear to be more than moderate at worst.  Aortic regurgitation appears moderate to severe.  It is possible that transthoracic echocardiogram could be underestimating severity of mitral regurgitation.  She does have reduced ejection fraction in the setting of 2 valvular regurgitation, with eccentric hypertrophy.  Therefore, I do think her LV is already seeing large amounts of volume overload, and as a result I has had drop in LVEF.  Given the above findings, and her symptoms of exertional dyspnea, I think she warrants further invasive evaluation with TEE and right and left heart catheterization.  While her chest tightness could well be due to her valvular regurgitation, it is also important to rule out obstructive CAD.  I suspect she will need surgical consultation after her TEE and right and left heart catheterization.  She has been on aspirin  325 mg daily for last several years since her stroke in 2008 or so.  Her stroke primarily appears to have been hypertensive in etiology.  Okay to reduce aspirin  to 81 mg daily for now.  Continue statin.  Blood pressure is currently well-controlled on losartan and metoprolol .  I will check CBC, BMP, proBNP today and plan for TEE and left and right heart catheterization in the coming few days.  Based on her labs, I may add a diuretic.     Informed Consent   Shared Decision Making/Informed Consent The risks [stroke (1 in 1000), death (1 in 1000), kidney failure [usually temporary] (1 in 500), bleeding (1 in 200), allergic reaction [possibly serious] (1 in 200)], benefits (diagnostic support and management of coronary artery disease) and alternatives of a cardiac catheterization were  discussed in detail with Hannah Willis and she is willing to proceed.       Meds ordered this encounter  Medications   aspirin  EC 81 MG tablet    Sig: Take 1 tablet (81 mg total) by mouth daily. Swallow whole.     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   F/u after TEE/cath  Signed, Newman JINNY Lawrence, MD

## 2023-12-21 NOTE — Patient Instructions (Addendum)
 Medications: Start Aspirin 81 mg once daily  Lab Work: BMP CBC ProBNP  If you have labs (blood work) drawn today and your tests are completely normal, you will receive your results only by: MyChart Message (if you have MyChart) OR A paper copy in the mail If you have any lab test that is abnormal or we need to change your treatment, we will call you to review the results.  Testing/Procedures: TEE with left and right heart cath  Follow-Up: At [facility], you and your health needs are our priority.  As part of our continuing mission to provide you with exceptional heart care, our providers are all part of one team.  This team includes your primary Cardiologist (physician) and Advanced Practice Providers or APPs (Physician Assistants and Nurse Practitioners) who all work together to provide you with the care you need, when you need it.  Your next appointment:   2 week(s) post procedure  Provider:   Cody Das, MD        Dear Brown [place] Ketter  You are scheduled for a TEE (Transesophageal Echocardiogram) on Friday, June 20 with Dr. Filiberto Hug.  Please arrive at the [facility] (Main Entrance A) at [facility]: [address] [place], [place] [address] at 12:00 PM (This time is 1 hour(s) before your procedure to ensure your preparation).   Free valet parking service is available. You will check in at ADMITTING.   *Please Note: You will receive a call the day before your procedure to confirm the appointment time. That time may have changed from the original time based on the schedule for that day.*   DIET:  Nothing to eat or drink after midnight except a sip of water  with medications (see medication instructions below)  MEDICATION INSTRUCTIONS: !!IF ANY NEW MEDICATIONS ARE STARTED AFTER TODAY, PLEASE NOTIFY YOUR PROVIDER AS SOON AS POSSIBLE!!  FYI: Medications such as Semaglutide (Ozempic, [place]), Tirzepatide (Mounjaro, Zepbound), Dulaglutide  (Trulicity), etc (GLP1 agonists) AND Canagliflozin (Invokana), Dapagliflozin (Farxiga), Empagliflozin (Jardiance), Ertugliflozin (Steglatro), Bexagliflozin [employer]) or any combination with one of these drugs such as Invokamet (Canagliflozin/Metformin), Synjardy (Empagliflozin/Metformin), etc (SGLT2 inhibitors) must be held around the time of a procedure. This is not a comprehensive list of all of these drugs. Please review all of your medications and talk to your provider if you take any one of these. If you are not sure, ask your provider.    LABS: Today  FYI:  For your safety, and to allow us  to monitor your vital signs accurately during the surgery/procedure we request: If you have artificial nails, gel coating, SNS etc, please have those removed prior to your surgery/procedure. Not having the nail coverings /polish removed may result in cancellation or delay of your surgery/procedure.  Your support person will be asked to wait in the waiting room during your procedure.  It is OK to have someone drop you off and come back when you are ready to be discharged.  You cannot drive after the procedure and will need someone to drive you home.  Bring your insurance cards.  *Special Note: Every effort is made to have your procedure done on time. Occasionally there are emergencies that occur at the hospital that may cause delays. Please be patient if a delay does occur.    [facility] A DEPT OF [place]. [facility] [address] A DEPT OF THE [place]. [facility] [address]  [place] [address] Dept: [phone]  Loc: [phone]  Cornell Gaber [place]  12/21/2023  You are scheduled for a Cardiac Catheterization on Friday, June 20 with Dr. Fransico Ivy.  1. Please arrive at the [facility] (Main Entrance A) at [facility]: [address] [place], [place] [address] for your catheterization after your TEE at 3 PM  Free valet parking service  is available. You will check in at ADMITTING. The support person will be asked to wait in the waiting room.  It is OK to have someone drop you off and come back when you are ready to be discharged.    Special note: Every effort is made to have your procedure done on time. Please understand that emergencies sometimes delay scheduled procedures.  2. Diet: Do not eat solid foods after midnight.  The patient may have clear liquids until 5am upon the day of the procedure.  3. Labs: Today  4. Medication instructions in preparation for your procedure:   Contrast Allergy: No  On the morning of your procedure, take your Aspirin 81 mg and any morning medicines NOT listed above.  You may use sips of water .  5. Plan to go home the same day, you will only stay overnight if medically necessary. 6. Bring a current list of your medications and current insurance cards. 7. You MUST have a responsible person to drive you home. 8. Someone MUST be with you the first 24 hours after you arrive home or your discharge will be delayed. 9. Please wear clothes that are easy to get on and off and wear slip-on shoes.  Thank you for allowing us  to care for you!   -- [facility]

## 2023-12-22 ENCOUNTER — Ambulatory Visit: Payer: Self-pay | Admitting: Cardiology

## 2023-12-22 LAB — BASIC METABOLIC PANEL WITH GFR
BUN/Creatinine Ratio: 20 (ref 12–28)
BUN: 14 mg/dL (ref 8–27)
CO2: 22 mmol/L (ref 20–29)
Calcium: 10.5 mg/dL — ABNORMAL HIGH (ref 8.7–10.3)
Chloride: 105 mmol/L (ref 96–106)
Creatinine, Ser: 0.69 mg/dL (ref 0.57–1.00)
Glucose: 101 mg/dL — ABNORMAL HIGH (ref 70–99)
Potassium: 4.3 mmol/L (ref 3.5–5.2)
Sodium: 140 mmol/L (ref 134–144)
eGFR: 92 mL/min/{1.73_m2} (ref >59)

## 2023-12-22 LAB — CBC
Hematocrit: 43.5 % (ref 34.0–46.6)
Hemoglobin: 14.7 g/dL (ref 11.1–15.9)
MCH: 33 pg (ref 26.6–33.0)
MCHC: 33.8 g/dL (ref 31.5–35.7)
MCV: 98 fL — ABNORMAL HIGH (ref 79–97)
Platelets: 220 10*3/uL (ref 150–450)
RBC: 4.46 x10E6/uL (ref 3.77–5.28)
RDW: 13.3 % (ref 11.7–15.4)
WBC: 9.9 10*3/uL (ref 3.4–10.8)

## 2023-12-22 LAB — PRO B NATRIURETIC PEPTIDE: NT-Pro BNP: 587 pg/mL — ABNORMAL HIGH (ref 0–301)

## 2023-12-22 MED ORDER — FUROSEMIDE 20 MG PO TABS: 20.0000 mg | ORAL_TABLET | Freq: Every day | ORAL | 3 refills | Status: DC

## 2023-12-22 NOTE — Progress Notes (Signed)
 BNP is needed.  Lasix 20 mg daily.  Thanks MJP

## 2023-12-23 ENCOUNTER — Telehealth: Payer: Self-pay | Admitting: *Deleted

## 2023-12-23 NOTE — Telephone Encounter (Addendum)
 Cardiac Catheterization scheduled at [facility] for: Friday December 25, 2023 3 PM/TEE 1 PM Arrival time [facility] Main Entrance A at: 12Noon  Nothing to eat or drink after midnight, may have water  to take medications.  Medication instructions: -Hold:  Lasix-AM of procedure -Other usual morning medications can be taken with sips of water  including aspirin 81 mg.  Plan to go home the same day, you will only stay overnight if medically necessary.  You must have responsible adult to drive you home.  Someone must be with you the first 24 hours after you arrive home.  Reviewed procedure instructions with patient.

## 2023-12-24 NOTE — Progress Notes (Signed)
 Spoke to patient and instructed them to come at 0745  and to be NPO after 0000.  Medications reviewed.    Confirmed that patient will have a ride home and someone to stay with them for 24 hours after the procedure.

## 2023-12-25 ENCOUNTER — Ambulatory Visit (HOSPITAL_COMMUNITY)

## 2023-12-25 ENCOUNTER — Encounter (HOSPITAL_COMMUNITY)
Admission: RE | Disposition: A | Discharge status: Home / Self Care | Payer: Self-pay | Source: Home / Self Care | Attending: Cardiology

## 2023-12-25 ENCOUNTER — Telehealth: Payer: Self-pay

## 2023-12-25 ENCOUNTER — Encounter (HOSPITAL_COMMUNITY): Payer: Self-pay | Admitting: Cardiology

## 2023-12-25 ENCOUNTER — Ambulatory Visit (HOSPITAL_COMMUNITY)
Admission: RE | Admit: 2023-12-25 | Discharge: 2023-12-25 | Disposition: A | Discharge status: Home / Self Care | Attending: Cardiology | Admitting: Cardiology

## 2023-12-25 ENCOUNTER — Other Ambulatory Visit: Payer: Self-pay

## 2023-12-25 DIAGNOSIS — Z87891 Personal history of nicotine dependence: Secondary | ICD-10-CM | POA: Diagnosis not present

## 2023-12-25 DIAGNOSIS — Z8673 Personal history of transient ischemic attack (TIA), and cerebral infarction without residual deficits: Secondary | ICD-10-CM | POA: Insufficient documentation

## 2023-12-25 DIAGNOSIS — I2510 Atherosclerotic heart disease of native coronary artery without angina pectoris: Secondary | ICD-10-CM | POA: Insufficient documentation

## 2023-12-25 DIAGNOSIS — I351 Nonrheumatic aortic (valve) insufficiency: Secondary | ICD-10-CM

## 2023-12-25 DIAGNOSIS — G4730 Sleep apnea, unspecified: Secondary | ICD-10-CM | POA: Insufficient documentation

## 2023-12-25 DIAGNOSIS — I340 Nonrheumatic mitral (valve) insufficiency: Secondary | ICD-10-CM

## 2023-12-25 DIAGNOSIS — I739 Peripheral vascular disease, unspecified: Secondary | ICD-10-CM | POA: Insufficient documentation

## 2023-12-25 DIAGNOSIS — R0789 Other chest pain: Secondary | ICD-10-CM | POA: Insufficient documentation

## 2023-12-25 DIAGNOSIS — I10 Essential (primary) hypertension: Secondary | ICD-10-CM | POA: Diagnosis not present

## 2023-12-25 DIAGNOSIS — Z9081 Acquired absence of spleen: Secondary | ICD-10-CM | POA: Diagnosis not present

## 2023-12-25 DIAGNOSIS — R0609 Other forms of dyspnea: Secondary | ICD-10-CM | POA: Diagnosis not present

## 2023-12-25 DIAGNOSIS — G4733 Obstructive sleep apnea (adult) (pediatric): Secondary | ICD-10-CM

## 2023-12-25 DIAGNOSIS — I080 Rheumatic disorders of both mitral and aortic valves: Secondary | ICD-10-CM | POA: Insufficient documentation

## 2023-12-25 DIAGNOSIS — I700 Atherosclerosis of aorta: Secondary | ICD-10-CM | POA: Diagnosis not present

## 2023-12-25 DIAGNOSIS — I77810 Thoracic aortic ectasia: Secondary | ICD-10-CM

## 2023-12-25 DIAGNOSIS — I639 Cerebral infarction, unspecified: Secondary | ICD-10-CM

## 2023-12-25 DIAGNOSIS — I77819 Aortic ectasia, unspecified site: Secondary | ICD-10-CM | POA: Diagnosis not present

## 2023-12-25 DIAGNOSIS — I7121 Aneurysm of the ascending aorta, without rupture: Secondary | ICD-10-CM

## 2023-12-25 HISTORY — PX: RIGHT/LEFT HEART CATH AND CORONARY ANGIOGRAPHY: CATH118266

## 2023-12-25 HISTORY — PX: TRANSESOPHAGEAL ECHOCARDIOGRAM (CATH LAB): EP1270

## 2023-12-25 LAB — POCT I-STAT EG7
Acid-base deficit: 1 mmol/L (ref 0.0–2.0)
Acid-base deficit: 1 mmol/L (ref 0.0–2.0)
Bicarbonate: 23.7 mmol/L (ref 20.0–28.0)
Bicarbonate: 23.9 mmol/L (ref 20.0–28.0)
Calcium, Ion: 1.24 mmol/L (ref 1.15–1.40)
Calcium, Ion: 1.26 mmol/L (ref 1.15–1.40)
HCT: 34 % — ABNORMAL LOW (ref 36.0–46.0)
HCT: 34 % — ABNORMAL LOW (ref 36.0–46.0)
Hemoglobin: 11.6 g/dL — ABNORMAL LOW (ref 12.0–15.0)
Hemoglobin: 11.6 g/dL — ABNORMAL LOW (ref 12.0–15.0)
O2 Saturation: 66 %
O2 Saturation: 72 %
Potassium: 4.2 mmol/L (ref 3.5–5.1)
Potassium: 4.2 mmol/L (ref 3.5–5.1)
Sodium: 141 mmol/L (ref 135–145)
Sodium: 142 mmol/L (ref 135–145)
TCO2: 25 mmol/L (ref 22–32)
TCO2: 25 mmol/L (ref 22–32)
pCO2, Ven: 39.1 mmHg — ABNORMAL LOW (ref 44–60)
pCO2, Ven: 39.1 mmHg — ABNORMAL LOW (ref 44–60)
pH, Ven: 7.39 (ref 7.25–7.43)
pH, Ven: 7.393 (ref 7.25–7.43)
pO2, Ven: 34 mmHg (ref 32–45)
pO2, Ven: 38 mmHg (ref 32–45)

## 2023-12-25 LAB — POCT I-STAT 7, (LYTES, BLD GAS, ICA,H+H)
Acid-base deficit: 2 mmol/L (ref 0.0–2.0)
Bicarbonate: 22 mmol/L (ref 20.0–28.0)
Calcium, Ion: 1.2 mmol/L (ref 1.15–1.40)
HCT: 33 % — ABNORMAL LOW (ref 36.0–46.0)
Hemoglobin: 11.2 g/dL — ABNORMAL LOW (ref 12.0–15.0)
O2 Saturation: 97 %
Potassium: 4.2 mmol/L (ref 3.5–5.1)
Sodium: 142 mmol/L (ref 135–145)
TCO2: 23 mmol/L (ref 22–32)
pCO2 arterial: 33 mmHg (ref 32–48)
pH, Arterial: 7.432 (ref 7.35–7.45)
pO2, Arterial: 87 mmHg (ref 83–108)

## 2023-12-25 LAB — ECHO TEE

## 2023-12-25 SURGERY — TRANSESOPHAGEAL ECHOCARDIOGRAM (TEE) (CATHLAB)
Anesthesia: Monitor Anesthesia Care

## 2023-12-25 SURGERY — RIGHT/LEFT HEART CATH AND CORONARY ANGIOGRAPHY
Anesthesia: LOCAL

## 2023-12-25 MED ORDER — HEPARIN SODIUM (PORCINE) 1000 UNIT/ML IJ SOLN
INTRAMUSCULAR | Status: DC | PRN
  Administered 2023-12-25: 3500 [IU] via INTRAVENOUS

## 2023-12-25 MED ORDER — FENTANYL CITRATE (PF) 100 MCG/2ML IJ SOLN
INTRAMUSCULAR | Status: DC | PRN
  Administered 2023-12-25: 25 ug via INTRAVENOUS

## 2023-12-25 MED ORDER — HYDRALAZINE HCL 20 MG/ML IJ SOLN: 10.0000 mg | INTRAMUSCULAR | Status: DC | PRN

## 2023-12-25 MED ORDER — VERAPAMIL HCL 2.5 MG/ML IV SOLN
INTRAVENOUS | Status: DC | PRN
  Administered 2023-12-25: 10 mL via INTRA_ARTERIAL

## 2023-12-25 MED ORDER — SODIUM CHLORIDE 0.9 % WEIGHT BASED INFUSION: 1.0000 mL/kg/h | INTRAVENOUS | Status: DC

## 2023-12-25 MED ORDER — ACETAMINOPHEN 325 MG PO TABS: 650.0000 mg | ORAL_TABLET | ORAL | Status: DC | PRN

## 2023-12-25 MED ORDER — MIDAZOLAM HCL 2 MG/2ML IJ SOLN
INTRAMUSCULAR | Status: DC | PRN
  Administered 2023-12-25: 1 mg via INTRAVENOUS

## 2023-12-25 MED ORDER — PROPOFOL 500 MG/50ML IV EMUL
INTRAVENOUS | Status: DC | PRN
  Administered 2023-12-25: 125 ug/kg/min via INTRAVENOUS

## 2023-12-25 MED ORDER — SODIUM CHLORIDE 0.9 % WEIGHT BASED INFUSION
3.0000 mL/kg/h | INTRAVENOUS | Status: AC
  Administered 2023-12-25: 3 mL/kg/h via INTRAVENOUS

## 2023-12-25 MED ORDER — PROPOFOL 10 MG/ML IV BOLUS
INTRAVENOUS | Status: DC | PRN
Start: 2023-12-25 — End: 2023-12-25
  Administered 2023-12-25: 50 mg via INTRAVENOUS
  Administered 2023-12-25: 30 mg via INTRAVENOUS

## 2023-12-25 MED ORDER — FENTANYL CITRATE (PF) 100 MCG/2ML IJ SOLN
INTRAMUSCULAR | Status: AC
  Filled 2023-12-25: qty 2

## 2023-12-25 MED ORDER — MIDAZOLAM HCL 2 MG/2ML IJ SOLN
INTRAMUSCULAR | Status: AC
Start: 2023-12-25 — End: 2023-12-25
  Filled 2023-12-25: qty 2

## 2023-12-25 MED ORDER — SODIUM CHLORIDE 0.9 % IV SOLN: INTRAVENOUS | Status: DC

## 2023-12-25 MED ORDER — LIDOCAINE 2% (20 MG/ML) 5 ML SYRINGE
INTRAMUSCULAR | Status: DC | PRN
  Administered 2023-12-25: 100 mg via INTRAVENOUS

## 2023-12-25 MED ORDER — IOHEXOL 350 MG/ML SOLN
INTRAVENOUS | Status: DC | PRN
  Administered 2023-12-25: 25 mL

## 2023-12-25 MED ORDER — ASPIRIN 81 MG PO CHEW: 81.0000 mg | CHEWABLE_TABLET | ORAL | Status: DC

## 2023-12-25 MED ORDER — HEPARIN SODIUM (PORCINE) 1000 UNIT/ML IJ SOLN
INTRAMUSCULAR | Status: AC
  Filled 2023-12-25: qty 10

## 2023-12-25 MED ORDER — SODIUM CHLORIDE 0.9% FLUSH: 3.0000 mL | Freq: Two times a day (BID) | INTRAVENOUS | Status: DC

## 2023-12-25 MED ORDER — LABETALOL HCL 5 MG/ML IV SOLN: 10.0000 mg | INTRAVENOUS | Status: DC | PRN

## 2023-12-25 MED ORDER — LIDOCAINE HCL (PF) 1 % IJ SOLN
INTRAMUSCULAR | Status: DC | PRN
  Administered 2023-12-25 (×2): 2 mL via INTRADERMAL

## 2023-12-25 MED ORDER — HEPARIN (PORCINE) IN NACL 2000-0.9 UNIT/L-% IV SOLN
INTRAVENOUS | Status: DC | PRN
  Administered 2023-12-25: 1000 mL

## 2023-12-25 MED ORDER — LIDOCAINE HCL (PF) 1 % IJ SOLN
INTRAMUSCULAR | Status: AC
  Filled 2023-12-25: qty 30

## 2023-12-25 MED ORDER — SODIUM CHLORIDE 0.9 % IV SOLN: 250.0000 mL | INTRAVENOUS | Status: DC | PRN

## 2023-12-25 MED ORDER — VERAPAMIL HCL 2.5 MG/ML IV SOLN
INTRAVENOUS | Status: AC
  Filled 2023-12-25: qty 2

## 2023-12-25 MED ORDER — SODIUM CHLORIDE 0.9% FLUSH: 3.0000 mL | INTRAVENOUS | Status: DC | PRN

## 2023-12-25 MED ORDER — ONDANSETRON HCL 4 MG/2ML IJ SOLN: 4.0000 mg | Freq: Four times a day (QID) | INTRAMUSCULAR | Status: DC | PRN

## 2023-12-25 SURGICAL SUPPLY — 10 items
CATH INFINITI 5FR MULTPACK ANG (CATHETERS) IMPLANT
CATH SWAN GANZ 7F STRAIGHT (CATHETERS) IMPLANT
DEVICE RAD COMP TR BAND LRG (VASCULAR PRODUCTS) IMPLANT
GLIDESHEATH SLEND A-KIT 6F 22G (SHEATH) IMPLANT
GLIDESHEATH SLENDER 7FR .021G (SHEATH) IMPLANT
GUIDEWIRE INQWIRE 1.5J.035X260 (WIRE) IMPLANT
PACK CARDIAC CATHETERIZATION (CUSTOM PROCEDURE TRAY) ×2 IMPLANT
SET ATX-X65L (MISCELLANEOUS) IMPLANT
SHEATH PROBE COVER 6X72 (BAG) IMPLANT
WIRE MICROINTRODUCER 60CM (WIRE) IMPLANT

## 2023-12-25 NOTE — Progress Notes (Signed)
 Patients HR currently in the 30's. Patient states that she feels fine other than being slightly dizzy. Leala Prince, PA and Filiberto Hug, MD notified. Per Leala Prince, PA an EKG should be obtained. EKG completed and PA at bedside. PA stated to place patient on telemetry monitor. Patient placed on telemetry. Currently awaiting Patwardhan, MD to come see patient.

## 2023-12-25 NOTE — Telephone Encounter (Signed)
 CVTS requesting need for CTA chest to evaluated TAA. Order placed for CTA.

## 2023-12-25 NOTE — H&P (Signed)
 Office Visit 12/21/2023 [facility] at [facility] [employer]. [facility]    Patwardhan, Kaye Parsons, MD Cardiology MITRAL VALVE PROLAPSE +3 more Dx exertional dyspnea; Referred by Faustina Hood, MD Reason for Visit   Additional Documentation  Vitals: BP 144/56 Important    Pulse 69   Ht 5' 2 (1.575 m)   Wt 65.8 kg   SpO2 97%   BMI 26.52 kg/m   BSA 1.7 m      More Vitals  Flowsheets: Anthropometrics,   NEWS,   MEWS Score,   Vital Signs,   Data  Encounter Info: Billing Info,   History,   Allergies,   Detailed Report   All Notes   Progress Notes by Cody Das, MD at 12/21/2023 2:20 PM  Author: Cody Das, MD Author Type: Physician Filed: 12/24/2023  9:32 AM  Note Status: Addendum Cosign: Cosign Not Required Encounter Date: 12/21/2023  Editor: Cody Das, MD (Physician)      Prior Versions: 1. Cody Das, MD (Physician) at 12/21/2023  5:13 PM - Signed  Expand All Collapse All  Cardiology Office Note:  .   Date:  12/21/2023  ID:  Hannah Willis, DOB [birth year], MRN [id number] PCP: Faustina Hood, MD  [facility] Providers Cardiologist:  Fransico Ivy, MD PCP: Faustina Hood, MD      Chief Complaint  Patient presents with   exertional dyspnea        Hannah Willis is a 73 y.o. female with hypertension, hyperlipidemia, remote h/o stroke, s/p splenectomy for hereditary spherocytosis, mitral and aortic valve regurgitation, exertional dyspnea   Discussed the use of AI scribe software for clinical note transcription with the patient, who gave verbal consent to proceed.   History of Present Illness Hannah Willis is a 72 year old female with a history of stroke and mitral valve prolapse who presents with shortness of breath and chest tightness.   She experiences shortness of breath and chest tightness primarily during physical exertion, such as walking uphill. These  symptoms have progressively worsened over the past couple of years. The chest tightness is described as a heaviness that occurs mainly when walking and sometimes when lying in bed, lasting for seconds and resolving on its own. She also experiences difficulty breathing when walking.   She has a history of a stroke 17 years ago 2 days after losing her mother, with blood pressures 200/100 mmHg.  She had right-sided loss of sensation, now only has mild residual loss of sensation in 2 fingers. She has been taking aspirin 325 mg daily since the stroke, except for a brief period when she was on a lower dose. She also takes losartan 50 mg, metoprolol  50 mg, and rosuvastatin 20 mg.   She reports swelling in her legs, which is more pronounced at times, and occasional lightheadedness but denies any episodes of passing out. She quit smoking and drinking nearly 30 years ago.   She has a history of mitral valve prolapse, which she recalls was very mild for many years and then started getting a little bit stronger. Her last echocardiogram was in 2011.              Vitals:    12/21/23 1434  BP: (!) 144/56  Pulse: 69  SpO2: 97%          Review of Systems  Cardiovascular:  Positive for dyspnea on exertion and leg swelling. Negative for chest pain, palpitations  and syncope.            Studies Reviewed: Aaron Aas         EKG 12/21/2023: Sinus rhythm with occasional Premature ventricular complexes Minimal voltage criteria for LVH, may be normal variant ( Cornell product ) Nonspecific ST and T wave abnormality   Echocardiogram 12/07/2023:  1. Left ventricular ejection fraction, by estimation, is 45 to 50%. The  left ventricle has mildly decreased function. The left ventricle  demonstrates global hypokinesis. The left ventricular internal cavity size  was mildly dilated. There is mild eccentric left ventricular hypertrophy.  Left ventricular diastolic parameters are consistent with Grade II diastolic  dysfunction  (pseudonormalization). Elevated left atrial pressure.   2. Right ventricular systolic function is normal. The right ventricular  size is normal. There is normal pulmonary artery systolic pressure. The  estimated right ventricular systolic pressure is 35.3 mmHg.   3. Left atrial size was severely dilated.   4. 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 No evidence of mitral stenosis. There is severe  holosystolic prolapse of the middle segment of the anterior leaflet of the  mitral valve.   5. The aortic valve is tricuspid. Aortic valve regurgitation is moderate  to severe. No aortic stenosis is present. Aortic regurgitation PHT  measures 276 msec.   6. Aortic dilatation noted. There is moderate dilatation of the aortic  root, measuring 45 mm. There is mild dilatation of the ascending aorta,  measuring 41 mm.   7. The inferior vena cava is normal in size with greater than 50%  respiratory variability, suggesting right atrial pressure of 3 mmHg.   Comparison(s): Prior images unable to be directly viewed, comparison made  by report only. Aortic insufficiency is a new dominant valvular  abnormality and the left ventricle has dilated and has reduced systolic  fumnction. 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    Independently interpreted 11/2023: Chol 126, TG 49, HDL 65, LDL 50 HbA1C 5% Hb 13.5, WBC 19k Cr 0.55, T.bili 1.8 TSH 0.8     Physical Exam Vitals and nursing note reviewed.  Constitutional:      General: She is not in acute distress. Neck:     Vascular: No JVD.    Cardiovascular:     Rate and Rhythm: Normal rate and regular rhythm.     Heart sounds: Murmur heard.     High-pitched blowing holosystolic murmur is present with a grade of 2/6 at the apex.     High-pitched blowing decrescendo early diastolic murmur is present with a grade of 3/4 at the upper right sternal border radiating to the apex.   Pulmonary:     Effort: Pulmonary effort is normal.     Breath sounds: Normal breath sounds. No wheezing or rales.    Musculoskeletal:     Right lower leg: No edema.     Left lower leg: No edema.          VISIT DIAGNOSES:     ICD-10-CM    1. MITRAL VALVE PROLAPSE  I05.9 EKG 12-Lead     2. Nonrheumatic aortic valve insufficiency  I35.1 Basic metabolic panel with GFR      Pro b natriuretic peptide (BNP)      CBC      CANCELED: CBC      CANCELED: Basic metabolic panel with GFR      CANCELED: Pro b natriuretic peptide (BNP)     3. Nonrheumatic mitral  valve regurgitation  I34.0 Basic metabolic panel with GFR      Pro b natriuretic peptide (BNP)      CBC      CANCELED: CBC      CANCELED: Basic metabolic panel with GFR      CANCELED: Pro b natriuretic peptide (BNP)     4. Exertional chest pain  R07.9             Dnya Farhiya Rosten is a 73 y.o. female with  hypertension, hyperlipidemia, remote h/o stroke, s/p splenectomy for hereditary spherocytosis, mitral and aortic valve regurgitation, exertional dyspnea Assessment & Plan Exertional chest tightness, dyspnea: I personally reviewed patient's recent echocardiogram from 12/2023.  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  While there is severe prolapse of the anterior mitral valve leaflet, mitral regurgitation does not appear to be more than moderate at worst.  Aortic regurgitation appears moderate to severe.  It is possible that transthoracic echocardiogram could be underestimating severity of mitral regurgitation.  She does have reduced ejection fraction in the setting of 2 valvular regurgitation, with eccentric hypertrophy.  Therefore, I do think her LV is already seeing large amounts of volume overload, and as a result I has had drop in LVEF.  Given the above findings, and her symptoms of exertional dyspnea, I think she warrants further invasive evaluation with TEE and right and left heart  catheterization.  While her chest tightness could well be due to her valvular regurgitation, it is also important to rule out obstructive CAD.  I suspect she will need surgical consultation after her TEE and right and left heart catheterization.  She has been on aspirin 325 mg daily for last several years since her stroke in 2008 or so.  Her stroke primarily appears to have been hypertensive in etiology.  Okay to reduce aspirin to 81 mg daily for now.  Continue statin.  Blood pressure is currently well-controlled on losartan and metoprolol .  I will check CBC, BMP, proBNP today and plan for TEE and left and right heart catheterization in the coming few days.  Based on her labs, I may add a diuretic.         Informed Consent Shared Decision Making/Informed Consent The risks [stroke (1 in 1000), death (1 in 1000), kidney failure [usually temporary] (1 in 500), bleeding (1 in 200), allergic reaction [possibly serious] (1 in 200)], benefits (diagnostic support and management of coronary artery disease) and alternatives of a cardiac catheterization were discussed in detail with Ms. Hannah Willis and she is willing to proceed.              Meds ordered this encounter  Medications   aspirin EC 81 MG tablet      Sig: Take 1 tablet (81 mg total) by mouth daily. Swallow whole.      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     F/u after TEE/cath   Signed, Cody Das, MD       For TEE, no  changes. Alexandria Angel

## 2023-12-25 NOTE — Anesthesia Postprocedure Evaluation (Signed)
 Anesthesia Post Note  Patient: Arts development officer  Procedure(s) Performed: TRANSESOPHAGEAL ECHOCARDIOGRAM     Patient location during evaluation: PACU Anesthesia Type: MAC Level of consciousness: awake and alert Pain management: pain level controlled Vital Signs Assessment: post-procedure vital signs reviewed and stable Respiratory status: spontaneous breathing, nonlabored ventilation and respiratory function stable Cardiovascular status: blood pressure returned to baseline and stable Postop Assessment: no apparent nausea or vomiting Anesthetic complications: no   No notable events documented.  Last Vitals:  Vitals:   12/25/23 0910 12/25/23 1033  BP: 115/70   Pulse: (!) 55   Resp: 17   Temp: 36.9 C   SpO2: 95% 97%    Last Pain:  Vitals:   12/25/23 1020  TempSrc:   PainSc: 0-No pain                 Earvin Goldberg

## 2023-12-25 NOTE — Telephone Encounter (Addendum)
 Contacted by Dr. Filiberto Hug to do as follows:     Order placed. Provider made aware. Will continue to follow referral to see if pt can see Dr. Filiberto Hug same day before appt with [facility] if Dr. Filiberto Hug is in clinic.

## 2023-12-25 NOTE — Telephone Encounter (Signed)
Thanks MJP  

## 2023-12-25 NOTE — H&P (View-Only) (Signed)
     Transesophageal Echocardiogram Note  Hannah Willis [id number] Nov 29, 1950  Procedure: Transesophageal Echocardiogram Indications: Aortic insufficiency  Procedure Details Consent: Obtained Time Out: Verified patient identification, verified procedure, site/side was marked, verified correct patient position, special equipment/implants available, Radiology Safety Procedures followed,  medications/allergies/relevent history reviewed, required imaging and test results available.  Performed  Medications:  Pt sedated by anesthesia with lidocaine 100 mg and diprovan 200 mg IV total.  Mild global reduction in LV function (EF 45-50); moderate LVE; severe LAE; no LAA thrombus; moderately dilated aortic root (4.7 cm); trileaflet aortic valve with severe, central AI; prolapse of middle scallop of anterior MV leaflet with moderate MR.    Complications: No apparent complications Patient did tolerate procedure well.  Alexandria Angel, MD

## 2023-12-25 NOTE — Progress Notes (Signed)
 TR BAND REMOVAL  LOCATION:    right radial  DEFLATED PER PROTOCOL:    Yes.    TIME BAND OFF / DRESSING APPLIED: 12/25/23 at 1300   SITE UPON ARRIVAL:    Level 0  SITE AFTER BAND REMOVAL:    Level 0  CIRCULATION SENSATION AND MOVEMENT:    Within Normal Limits   Yes.    COMMENTS:

## 2023-12-25 NOTE — Progress Notes (Signed)
     Transesophageal Echocardiogram Note  Hannah Willis [id number] Nov 29, 1950  Procedure: Transesophageal Echocardiogram Indications: Aortic insufficiency  Procedure Details Consent: Obtained Time Out: Verified patient identification, verified procedure, site/side was marked, verified correct patient position, special equipment/implants available, Radiology Safety Procedures followed,  medications/allergies/relevent history reviewed, required imaging and test results available.  Performed  Medications:  Pt sedated by anesthesia with lidocaine 100 mg and diprovan 200 mg IV total.  Mild global reduction in LV function (EF 45-50); moderate LVE; severe LAE; no LAA thrombus; moderately dilated aortic root (4.7 cm); trileaflet aortic valve with severe, central AI; prolapse of middle scallop of anterior MV leaflet with moderate MR.    Complications: No apparent complications Patient did tolerate procedure well.  Alexandria Angel, MD

## 2023-12-25 NOTE — Transfer of Care (Signed)
 Immediate Anesthesia Transfer of Care Note  Patient: Hannah Willis  Procedure(s) Performed: TRANSESOPHAGEAL ECHOCARDIOGRAM  Patient Location: Cath Lab  Anesthesia Type:MAC  Level of Consciousness: awake and patient cooperative  Airway & Oxygen Therapy: Patient Spontanous Breathing and Patient connected to nasal cannula oxygen  Post-op Assessment: Report given to RN, Post -op Vital signs reviewed and stable, and Patient moving all extremities X 4  Post vital signs: Reviewed and stable  Last Vitals:  Vitals Value Taken Time  BP 99/49 12/25/2023 0840  Temp    Pulse 62 12/25/2023 0840  Resp 22 12/25/2023 0840  SpO2 96% 12/25/2023 0840    Last Pain:  Vitals:   12/25/23 0744  TempSrc:   PainSc: 0-No pain         Complications: No notable events documented.

## 2023-12-25 NOTE — Interval H&P Note (Signed)
 History and Physical Interval Note:  12/25/2023 7:42 AM  Hannah Willis  has presented today for surgery, with the diagnosis of AORTIC REGURGITATION.  The various methods of treatment have been discussed with the patient and family. After consideration of risks, benefits and other options for treatment, the patient has consented to  Procedure(s): TRANSESOPHAGEAL ECHOCARDIOGRAM (N/A) as a surgical intervention.  The patient's history has been reviewed, patient examined, no change in status, stable for surgery.  I have reviewed the patient's chart and labs.  Questions were answered to the patient's satisfaction.     Alexandria Angel

## 2023-12-25 NOTE — Anesthesia Preprocedure Evaluation (Signed)
 Anesthesia Evaluation  Patient identified by MRN, date of birth, ID band Patient awake    Reviewed: Allergy & Precautions, NPO status , Patient's Chart, lab work & pertinent test results, reviewed documented beta blocker date and time   History of Anesthesia Complications (+) PONV and history of anesthetic complications  Airway Mallampati: II  TM Distance: >3 FB Neck ROM: Full    Dental  (+) Caps, Dental Advisory Given   Pulmonary sleep apnea , former smoker   Pulmonary exam normal        Cardiovascular hypertension, Pt. on medications and Pt. on home beta blockers + Peripheral Vascular Disease  Normal cardiovascular exam+ Valvular Problems/Murmurs      Neuro/Psych  Headaches CVA, Residual Symptoms  negative psych ROS   GI/Hepatic Neg liver ROS, PUD,GERD  ,,  Endo/Other  negative endocrine ROS    Renal/GU negative Renal ROS     Musculoskeletal   Abdominal   Peds  Hematology negative hematology ROS (+)   Anesthesia Other Findings   Reproductive/Obstetrics                             Anesthesia Physical Anesthesia Plan  ASA: III  Anesthesia Plan: MAC   Post-op Pain Management: Minimal or no pain anticipated   Induction: Intravenous  PONV Risk Score and Plan: 3 and Propofol  infusion and Treatment may vary due to age or medical condition  Airway Management Planned: Natural Airway and Nasal Cannula  Additional Equipment:   Intra-op Plan:   Post-operative Plan:   Informed Consent: I have reviewed the patients History and Physical, chart, labs and discussed the procedure including the risks, benefits and alternatives for the proposed anesthesia with the patient or authorized representative who has indicated his/her understanding and acceptance.     Dental advisory given  Plan Discussed with: Anesthesiologist and CRNA  Anesthesia Plan Comments:         Anesthesia Quick  Evaluation

## 2023-12-25 NOTE — Progress Notes (Signed)
 Patwardhan, MD stated that it is ok for patient to be discharged at this time.

## 2023-12-25 NOTE — Interval H&P Note (Signed)
 History and Physical Interval Note:  12/25/2023 10:18 AM  Hannah Willis  has presented today for surgery, with the diagnosis of mitral req.  The various methods of treatment have been discussed with the patient and family. After consideration of risks, benefits and other options for treatment, the patient has consented to  Procedure(s): RIGHT/LEFT HEART CATH AND CORONARY ANGIOGRAPHY (N/A) as a surgical intervention.  The patient's history has been reviewed, patient examined, no change in status, stable for surgery.  I have reviewed the patient's chart and labs.  Questions were answered to the patient's satisfaction.     Kalli Greenfield J Corrin Hingle

## 2023-12-26 ENCOUNTER — Encounter (HOSPITAL_COMMUNITY): Payer: Self-pay | Admitting: Cardiology

## 2023-12-27 ENCOUNTER — Ambulatory Visit: Payer: Self-pay | Admitting: Physician Assistant

## 2023-12-29 DIAGNOSIS — G4733 Obstructive sleep apnea (adult) (pediatric): Secondary | ICD-10-CM | POA: Diagnosis not present

## 2023-12-29 DIAGNOSIS — I351 Nonrheumatic aortic (valve) insufficiency: Secondary | ICD-10-CM | POA: Diagnosis not present

## 2023-12-29 DIAGNOSIS — I639 Cerebral infarction, unspecified: Secondary | ICD-10-CM | POA: Diagnosis not present

## 2023-12-29 DIAGNOSIS — I10 Essential (primary) hypertension: Secondary | ICD-10-CM | POA: Diagnosis not present

## 2023-12-30 NOTE — Telephone Encounter (Signed)
 Spoke with pt over the phone and she stated she had no heard from anyone regarding scheduling her CTA that [facility] needed. Explained to pt that I will send a message to our CT scheduler to see if we can get this scheduled. Pt verbalized understanding of plan and had no further questions or concerns at this time.

## 2023-12-30 NOTE — Telephone Encounter (Signed)
 Pt requesting a c/b from the nurse

## 2024-01-04 DIAGNOSIS — M810 Age-related osteoporosis without current pathological fracture: Secondary | ICD-10-CM | POA: Diagnosis not present

## 2024-01-04 DIAGNOSIS — I10 Essential (primary) hypertension: Secondary | ICD-10-CM | POA: Diagnosis not present

## 2024-01-04 DIAGNOSIS — E7800 Pure hypercholesterolemia, unspecified: Secondary | ICD-10-CM | POA: Diagnosis not present

## 2024-01-04 NOTE — Telephone Encounter (Signed)
 Unfortunately Im not in the office on 7/21. If patient is okay, keep the appts as they are, unless [facility] can see her on 7/16.  Thanks MJP

## 2024-01-04 NOTE — Telephone Encounter (Signed)
 Left message with [facility] scheduler to call back.

## 2024-01-04 NOTE — Telephone Encounter (Signed)
 Unable to move [facility] appointment up.  Will keep everything the way it is.

## 2024-01-04 NOTE — Telephone Encounter (Signed)
 Appt on 7/21 with [facility]. Pt scheduled to see Dr. Elmira on 7/16. Please advise if you are okay with this.

## 2024-01-05 ENCOUNTER — Ambulatory Visit (HOSPITAL_COMMUNITY)
Admission: RE | Admit: 2024-01-05 | Discharge: 2024-01-05 | Disposition: A | Discharge status: Home / Self Care | Source: Ambulatory Visit | Attending: Cardiology | Admitting: Cardiology

## 2024-01-05 DIAGNOSIS — N200 Calculus of kidney: Secondary | ICD-10-CM | POA: Diagnosis not present

## 2024-01-05 DIAGNOSIS — I7121 Aneurysm of the ascending aorta, without rupture: Secondary | ICD-10-CM | POA: Diagnosis not present

## 2024-01-05 DIAGNOSIS — I2510 Atherosclerotic heart disease of native coronary artery without angina pectoris: Secondary | ICD-10-CM | POA: Diagnosis not present

## 2024-01-05 DIAGNOSIS — I359 Nonrheumatic aortic valve disorder, unspecified: Secondary | ICD-10-CM | POA: Diagnosis not present

## 2024-01-05 MED ORDER — IOHEXOL 350 MG/ML SOLN
100.0000 mL | Freq: Once | INTRAVENOUS | Status: AC | PRN
  Administered 2024-01-05: 100 mL via INTRAVENOUS

## 2024-01-06 ENCOUNTER — Ambulatory Visit: Payer: Self-pay | Admitting: Cardiology

## 2024-01-06 NOTE — Progress Notes (Signed)
 CT scan corroborates TEE finding of aortic root dilatation. Keep f/u w/me and [facility].  Thanks MJP

## 2024-01-07 ENCOUNTER — Ambulatory Visit: Admitting: Cardiology

## 2024-01-13 ENCOUNTER — Ambulatory Visit

## 2024-01-13 VITALS — BP 138/53 | HR 56 | Temp 97.9°F | Resp 16 | Ht 62.0 in | Wt 147.4 lb

## 2024-01-13 DIAGNOSIS — K5130 Ulcerative (chronic) rectosigmoiditis without complications: Secondary | ICD-10-CM

## 2024-01-13 MED ORDER — VEDOLIZUMAB 300 MG IV SOLR
300.0000 mg | Freq: Once | INTRAVENOUS | Status: AC
  Administered 2024-01-13: 300 mg via INTRAVENOUS
  Filled 2024-01-13: qty 5

## 2024-01-13 NOTE — Progress Notes (Signed)
 Diagnosis: Ulcerative Colitis  Provider:  Mannam, Praveen MD  Procedure: IV Infusion  IV Type: Peripheral, IV Location: L Antecubital  Entyvio  (Vedolizumab ), Dose: 300 mg  Infusion Start Time: 1122  Infusion Stop Time: 1156  Post Infusion IV Care: Peripheral IV Discontinued  Discharge: Condition: Good, Destination: Home . AVS Provided  Performed by:  Rocky FORBES Sar, RN

## 2024-01-20 ENCOUNTER — Ambulatory Visit: Attending: Cardiology | Admitting: Cardiology

## 2024-01-20 ENCOUNTER — Encounter: Payer: Self-pay | Admitting: Cardiology

## 2024-01-20 VITALS — BP 130/48 | HR 80 | Ht 62.0 in | Wt 144.0 lb

## 2024-01-20 DIAGNOSIS — I5020 Unspecified systolic (congestive) heart failure: Secondary | ICD-10-CM | POA: Diagnosis not present

## 2024-01-20 DIAGNOSIS — Q2543 Congenital aneurysm of aorta: Secondary | ICD-10-CM | POA: Diagnosis not present

## 2024-01-20 DIAGNOSIS — I351 Nonrheumatic aortic (valve) insufficiency: Secondary | ICD-10-CM

## 2024-01-20 DIAGNOSIS — I7121 Aneurysm of the ascending aorta, without rupture: Secondary | ICD-10-CM | POA: Insufficient documentation

## 2024-01-20 NOTE — Progress Notes (Signed)
 Cardiology Office Note:  .   Date:  01/20/2024  ID:  Hannah Willis, DOB 1950-07-23, MRN [id number] PCP: Claudene Pellet, MD  [facility] Providers Cardiologist:  Newman Lawrence, MD PCP: Claudene Pellet, MD  Chief Complaint  Patient presents with   Aortic regurgitation     Hannah Willis is a 73 y.o. 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  History of Present Illness Hannah Willis is a 73 year old female with a history of stroke and mitral valve prolapse who presents with shortness of breath and chest tightness.  She experiences shortness of breath and chest tightness primarily during physical exertion, such as walking uphill. These symptoms have progressively worsened over the past couple of years. The chest tightness is described as a heaviness that occurs mainly when walking and sometimes when lying in bed, lasting for seconds and resolving on its own. She also experiences difficulty breathing when walking.  She has a history of a stroke 17 years ago 2 days after losing her mother, with blood pressures 200/100 mmHg.  She had right-sided loss of sensation, now only has mild residual loss of sensation in 2 fingers. She has been taking aspirin  325 mg daily since the stroke, except for a brief period when she was on a lower dose. She also takes losartan 50 mg, metoprolol  50 mg, and rosuvastatin 20 mg.  She reports swelling in her legs, which is more pronounced at times, and occasional lightheadedness but denies any episodes of passing out. She quit smoking and drinking nearly 30 years ago.  She has a history of mitral valve prolapse, which she recalls  was very mild for many years and then started getting a little bit stronger. Her last echocardiogram was in 2011.      Vitals:   01/20/24 0751  BP: (!) 130/48  Pulse: 80  SpO2: 97%      Review of Systems  Cardiovascular:  Positive for chest pain and dyspnea on exertion. Negative for leg swelling, palpitations and syncope.        Studies Reviewed: SABRA        EKG 12/21/2023: Sinus rhythm with occasional Premature ventricular complexes Minimal voltage criteria for LVH, may be normal variant ( Cornell product ) Nonspecific ST and T wave abnormality  CTA aorta 01/2024: 1. 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 2. No effacement of the sino-tubular junction and mildly ectatic but nonaneurysmal ascending aorta with normal transverse and descending thoracic aorta diameters. 3. Aortic and coronary artery atherosclerotic vascular calcifications. 4. Four vessel arch anatomy. The left vertebral artery arises directly from the aorta. 5. Cardiomegaly with prominent left atrial enlargement and mild left ventricular dilatation. 6. Nonobstructing left lower pole nephrolithiasis. 7. Multilevel degenerative disc disease.    Coronary angiography 12/25/2023: LM: Normal LAD: Prox 30% , distal 40% disease Ramus: Small, no significant disease Lcx: No significant disease RCA: Large, dominant          RPLA focal 40% disease   LVEDP 30 mmHg   Right heart catheterization 12/25/2023: RA: 7 mmHg RV: 27/3 mmHg PA: 30/15 mmHg, mPAP 16 mmHg PCW: 12 mmHg   AO sats: 97% PA sats: 69%   CO: 3.9  L/min CI: 2.3 L/min/m2   Mild nonobstructive coronary artery disease Elevated LVEDP in the setting of severe aortic regurgitation and reduced LVEF Refer to cardiothoracic surgery for consideration for aortic valve and possible aortic root replacement    TEE 12/2023: 1. AI is central with eccentric course; likely due to dilated aortic  root.   2.  Left ventricular ejection fraction, by estimation, is 45 to 50%. The  left ventricle has mildly decreased function. The left ventricle  demonstrates global hypokinesis. The left ventricular internal cavity size  was moderately dilated.   3. Right ventricular systolic function is normal. The right ventricular  size is normal.   4. Left atrial size was severely dilated. No left atrial/left atrial  appendage thrombus was detected.   5. 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 There is moderate holosystolic prolapse of the middle  segment of the anterior leaflet of the mitral valve.   6. The aortic valve is tricuspid. Aortic valve regurgitation is severe.  Aortic valve sclerosis is present, with no evidence of aortic valve  stenosis.   7. Aortic dilatation noted. There is moderate dilatation of the aortic  root, measuring 47 mm. There is Moderate (Grade III) plaque involving the  descending aorta.   8. 3D performed of the mitral valve and 3D performed of the aortic valve.   Echocardiogram 12/07/2023:  1. Left ventricular ejection fraction, by estimation, is 45 to 50%. The  left ventricle has mildly decreased function. The left ventricle  demonstrates global hypokinesis. The left ventricular internal cavity size  was mildly dilated. There is mild eccentric left ventricular hypertrophy.  Left ventricular diastolic parameters are consistent with Grade II diastolic dysfunction  (pseudonormalization). Elevated left atrial pressure.   2. Right ventricular systolic function is normal. The right ventricular  size is normal. There is normal pulmonary artery systolic pressure. The  estimated right ventricular systolic pressure is 35.3 mmHg.   3. Left atrial size was severely dilated.   4. 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 No evidence of mitral stenosis. There is severe  holosystolic prolapse of the middle segment of the  anterior leaflet of the  mitral valve.   5. The aortic valve is tricuspid. Aortic valve regurgitation is moderate  to severe. No aortic stenosis is present. Aortic regurgitation PHT  measures 276 msec.   6. Aortic dilatation noted. There is moderate dilatation of the aortic  root, measuring 45 mm. There is mild dilatation of the ascending aorta,  measuring 41 mm.   7. The inferior vena cava is normal in size with greater than 50%  respiratory variability, suggesting right atrial pressure of 3 mmHg.   Comparison(s): Prior images unable to be directly viewed, comparison made  by report only. Aortic insufficiency is a new dominant valvular  abnormality and the left ventricle has dilated and has reduced systolic  fumnction. 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   Independently interpreted 11/2023: Chol 126, TG 49, HDL 65, LDL 50 HbA1C 5% Hb 13.5, WBC 19k Cr 0.55, T.bili 1.8 TSH 0.8   Physical Exam Vitals and nursing note reviewed.  Constitutional:      General: She is not in acute distress. Neck:     Vascular: No JVD.  Cardiovascular:     Rate and Rhythm: Normal rate and regular rhythm.     Heart sounds: Murmur heard.     High-pitched blowing holosystolic murmur is present  with a grade of 2/6 at the apex.     High-pitched blowing decrescendo early diastolic murmur is present with a grade of 3/4 at the upper right sternal border radiating to the apex.  Pulmonary:     Effort: Pulmonary effort is normal.     Breath sounds: Normal breath sounds. No wheezing or rales.  Musculoskeletal:     Right lower leg: No edema.     Left lower leg: No edema.      VISIT DIAGNOSES:   ICD-10-CM   1. Severe aortic regurgitation  I35.1     2. Aortic root aneurysm  Q25.43     3. HFrEF (heart failure with reduced ejection fraction) (HCC)  I50.20         Hannah Willis is a 73 y.o. 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  The valve itself appears to be normal.  Patient is symptomatic with exertional dyspnea and occasional chest pain.  LVEF is 45-50%.  I do think she will benefit from surgery given her symptomatic severe AI with LV dysfunction.  Whether she can undergo valve sparing aortic root replacement surgery or she would need aortic valve replacement will depend on her evaluation by Dr. Lucas next week.  In the meantime, continue losartan and metoprolol , along with Lasix .  Depending on her postop recovery and postop EF, we will need to consider if she would need other GDMT for HFrEF or not.  Aortic root aneurysm: Likely the cause for her severe aortic regurgitation.  There is no effacement of sinotubular junction, or aneurysm and rest of her aorta.  Patient does not have any siblings or children.  I offered genetic counseling, but it would likely add no further value to patient care, therefore opted out.  I wished her and her husband the best as they plan for potential upcoming surgery.  F/u in 3 months  Signed, Newman JINNY Lawrence, MD

## 2024-01-20 NOTE — Patient Instructions (Signed)
 Medication Instructions:  The current medical regimen is effective;  continue present plan and medications.  *If you need a refill on your cardiac medications before your next appointment, please call your pharmacy*  Follow-Up: At [facility], you and your health needs are our priority.  As part of our continuing mission to provide you with exceptional heart care, our providers are all part of one team.  This team includes your primary Cardiologist (physician) and Advanced Practice Providers or APPs (Physician Assistants and Nurse Practitioners) who all work together to provide you with the care you need, when you need it.  Your next appointment:   Thursday August 16th 11:30 AM  Provider:   Newman JINNY Lawrence, MD    We recommend signing up for the patient portal called MyChart.  Sign up information is provided on this After Visit Summary.  MyChart is used to connect with patients for Virtual Visits (Telemedicine).  Patients are able to view lab/test results, encounter notes, upcoming appointments, etc.  Non-urgent messages can be sent to your provider as well.   To learn more about what you can do with MyChart, go to [email or web address]

## 2024-01-25 ENCOUNTER — Encounter: Payer: Self-pay | Admitting: Surgery

## 2024-01-25 ENCOUNTER — Ambulatory Visit: Attending: Surgery | Admitting: Surgery

## 2024-01-25 VITALS — BP 154/51 | HR 58 | Resp 18 | Ht 62.0 in | Wt 145.0 lb

## 2024-01-25 DIAGNOSIS — I351 Nonrheumatic aortic (valve) insufficiency: Secondary | ICD-10-CM | POA: Diagnosis not present

## 2024-01-25 DIAGNOSIS — Q2543 Congenital aneurysm of aorta: Secondary | ICD-10-CM

## 2024-01-25 NOTE — Progress Notes (Signed)
 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  She also reports some chest heaviness.  She has had lower extremity edema which is improved with Lasix .  She has had occasional episodes of dizziness but no syncope.  She had a history of mitral valve prolapse which was mildly symptomatic and her last echocardiogram was in 2011.  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  There is mild to moderate mitral valve regurgitation with holosystolic prolapse of the middle segment of the anterior mitral leaflet.  The aortic valve was trileaflet with moderate to severe aortic insufficiency with a pressure half-time of 276 ms.  The aortic root was noted to be 4.5 cm.  Left ventricular ejection fraction was 45 to 50% with global hypokinesis and mild LV dilation to 5.9 cm.  A TEE was performed on 12/25/2023 showing ejection fraction of 45 to 50%.  The aortic valve was trileaflet with severe central AI with an eccentric course.  The aortic root was measured at 4.7 cm.  The mitral valve is myxomatous with moderate holosystolic prolapse of the middle segment of the anterior leaflet with mild to moderate mitral valve regurgitation.  Cardiac catheterization on the same day showed mild nonobstructive coronary disease with an elevated LVEDP of 30.  PA pressure was 30/15 with a mean of 16.  Wedge pressure was 12.  Right atrial pressure was 7.  CTA of the chest showed the aortic  root to measure 5.2 cm.  The patient is here today with her husband.  She stays active walking and riding a stationary bicycle.  Past Medical History:  Diagnosis Date   Allergy    Apnea, sleep    no c-pap machine   Arthritis    OA   Basal cell carcinoma    on front chest   Blood in stool    LAST 2 MONTHS, MEDS CHNAGED FOR   GERD (gastroesophageal reflux disease)    Heart murmur    HFrEF (heart failure with reduced ejection fraction) (HCC) 01/20/2024   Hyperlipidemia    Hypertension    Kidney stones    Laryngitis    started monday night   Neuromuscular disorder (HCC)    Post-operative nausea and vomiting    Severe aortic regurgitation 12/21/2023   Stroke (HCC) 09/21/2007   Synovitis of right hand 12/29/2015   and wrist   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   Ulcerative colitis (HCC)    Weakness of right hand    due to stroke    Past Surgical History:  Procedure Laterality Date   COLONSCOPY     RIGHT/LEFT HEART CATH AND CORONARY ANGIOGRAPHY N/A 12/25/2023   Procedure: RIGHT/LEFT HEART CATH AND CORONARY ANGIOGRAPHY;  Surgeon: Elmira Newman PARAS, MD;  Location: [facility] INVASIVE CV LAB;  Service: Cardiovascular;  Laterality: N/A;   SHOULDER ARTHROSCOPY W/ ROTATOR CUFF REPAIR Right 2010   SPLENECTOMY, 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 at 31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   TOTAL KNEE ARTHROPLASTY Left 2001   TOTAL KNEE ARTHROPLASTY Right 12/31/2015   Procedure: RIGHT TOTAL KNEE ARTHROPLASTY;  Surgeon: Donnice Car, MD;  Location: [facility];  Service: Orthopedics;  Laterality: Right;   TRANSESOPHAGEAL ECHOCARDIOGRAM (CATH LAB) N/A 12/25/2023   Procedure: TRANSESOPHAGEAL ECHOCARDIOGRAM;  Surgeon: Pietro Redell RAMAN, MD;  Location: [facility] INVASIVE CV LAB;  Service: Cardiovascular;  Laterality: N/A;    Family History  Problem Relation Age of Onset   Diabetes Father    Mental illness Father    Narcolepsy Father    Heart disease Paternal Grandmother    Diabetes Paternal Grandmother    Heart attack Maternal  Grandfather    Stroke Maternal Grandmother    Colon cancer Neg Hx    Esophageal cancer Neg Hx    Rectal cancer Neg Hx    Stomach cancer Neg Hx     Social History Social History   Tobacco Use   Smoking status: Former    Current packs/day: 0.00    Types: Cigarettes    Quit date: 06/20/1989    Years since quitting: 34.6   Smokeless tobacco: Never   Tobacco comments:    smoked occasional cigarettes for 5 years  Vaping Use   Vaping status: Never Used  Substance Use Topics   Alcohol use: No    Alcohol/week: 0.0 standard drinks of alcohol   Drug use: No    Current Outpatient Medications  Medication Sig Dispense Refill   alendronate (FOSAMAX) 70 MG tablet Take 70 mg by mouth once a week. Take with a full glass of water  on an empty stomach.     Apoaequorin (PREVAGEN PO) Take 1 tablet by mouth daily.     aspirin  EC 81 MG tablet Take 1 tablet (81 mg total) by mouth daily. Swallow whole.     calcium carbonate (OSCAL) 1500 (600 Ca) MG TABS tablet Take by mouth 2 (two) times daily with a meal.     ciprofloxacin  (CIPRO ) 500 MG tablet Take 1 tablet (500 mg total) by mouth 2 (two) times daily. 10 tablet 0   furosemide  (LASIX ) 20 MG tablet Take 1 tablet (20 mg total) by mouth daily. 90 tablet 3   losartan (COZAAR) 50 MG tablet Take 50 mg by mouth daily.     metoprolol  succinate (TOPROL -XL) 50 MG 24 hr tablet Take 50 mg by mouth daily. Take with or immediately following a meal.     rosuvastatin (CRESTOR) 20 MG tablet Take 20 mg by mouth daily.     thiamine (VITAMIN B-1) 100 MG tablet Take 100 mg by mouth daily.     Vedolizumab  (ENTYVIO  IV) Inject as directed See admin instructions. Experimental every two months, given at the MD office     VITAMIN D  PO Take 1 tablet by mouth daily.     No current facility-administered medications for this visit.    Allergies  Allergen Reactions   Clindamycin Hcl Other (See Comments)   Codeine Nausea And Vomiting   Morphine  And Codeine     Causes  hallucinations     Review of Systems  Constitutional:  Positive for fatigue. Negative for activity change.  HENT:  Positive for hearing loss. Negative for dental problem.        Sees her dentist regularly  Eyes: Negative.   Respiratory:  Positive for chest tightness and shortness of breath.   Cardiovascular:  Positive for leg swelling. Negative for chest pain and palpitations.  Gastrointestinal: Negative.  Endocrine: Negative.   Genitourinary: Negative.   Musculoskeletal:  Positive for arthralgias.  Skin: Negative.   Allergic/Immunologic: Negative.   Neurological:  Positive for dizziness.       Some weakness in grip in the right hand, particular the 4th and 5th fingers since her stroke.  Hematological: Negative.   Psychiatric/Behavioral: Negative.      BP (!) 154/51   Pulse (!) 58   Resp 18   Ht 5' 2 (1.575 m)   Wt 145 lb (65.8 kg)   SpO2 95%   BMI 26.52 kg/m  Physical Exam Constitutional:      Appearance: Normal appearance. She is normal weight.  HENT:     Head: Normocephalic and atraumatic.  Eyes:     Extraocular Movements: Extraocular movements intact.     Conjunctiva/sclera: Conjunctivae normal.     Pupils: Pupils are equal, round, and reactive to light.  Neck:     Vascular: No carotid bruit.  Cardiovascular:     Rate and Rhythm: Normal rate and regular rhythm.     Pulses: Normal pulses.     Heart sounds: Murmur heard.     Comments: 2/6 systolic murmur, 3/6 diastolic murmur right sternal border Pulmonary:     Effort: Pulmonary effort is normal.     Breath sounds: Normal breath sounds.  Abdominal:     General: There is no distension.     Tenderness: There is no abdominal tenderness.  Musculoskeletal:        General: No swelling.  Skin:    General: Skin is warm and dry.  Neurological:     General: No focal deficit present.     Mental Status: She is alert and oriented to person, place, and time.  Psychiatric:        Mood and Affect: Mood normal.         Behavior: Behavior normal.      Diagnostic Tests:   TRANSESOPHOGEAL ECHO REPORT       Patient Name:   GLORIAJEAN OKUN [facility] Date of Exam: 12/25/2023  Medical Rec #:  [id number]             Height:       62.0 in  Accession #:    [id number]            Weight:       145.0 lb  Date of Birth:  Dec 13, 1950             BSA:          1.667 m  Patient Age:    72 years              BP:           120/55 mmHg  Patient Gender: F                     HR:           69 bpm.  Exam Location:  Inpatient   Procedure: Transesophageal Echo, 3D Echo, Cardiac Doppler and Color  Doppler            (Both Spectral and Color Flow Doppler were utilized during             procedure).   Indications:     Mitral Abnormality    History:         Patient has prior history of Echocardiogram examinations,  most  recent 12/07/2023.    Sonographer:     Jayson Gaskins  Referring Phys:  36 BRIAN S CRENSHAW  Diagnosing Phys: Redell Shallow MD   PROCEDURE: After discussion of the risks and benefits of a TEE, an  informed consent was obtained from the patient. The transesophogeal probe  was passed without difficulty through the esophogus of the patient.  Sedation performed by different physician.  The patient was monitored while under deep sedation. Anesthestetic  sedation was provided intravenously by Anesthesiology: 200mg  of Propofol ,  100mg  of Lidocaine . The patient developed no complications during the  procedure.    IMPRESSIONS     1. AI is central with eccentric course; likely due to dilated aortic  root.   2. Left ventricular ejection fraction, by estimation, is 45 to 50%. The  left ventricle has mildly decreased function. The left ventricle  demonstrates global hypokinesis. The left ventricular internal cavity size  was moderately dilated.   3. Right ventricular systolic function is normal. The right ventricular  size is normal.   4. Left atrial size was severely dilated. No left  atrial/left atrial  appendage thrombus was detected.   5. 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 There is moderate holosystolic prolapse of the middle  segment of the anterior leaflet of the mitral valve.   6. The aortic valve is tricuspid. Aortic valve regurgitation is severe.  Aortic valve sclerosis is present, with no evidence of aortic valve  stenosis.   7. Aortic dilatation noted. There is moderate dilatation of the aortic  root, measuring 47 mm. There is Moderate (Grade III) plaque involving the  descending aorta.   8. 3D performed of the mitral valve and 3D performed of the aortic valve.   FINDINGS   Left Ventricle: Left ventricular ejection fraction, by estimation, is 45  to 50%. The left ventricle has mildly decreased function. The left  ventricle demonstrates global hypokinesis. The left ventricular internal  cavity size was moderately dilated.   Right Ventricle: The right ventricular size is normal. Right ventricular  systolic function is normal.   Left Atrium: Left atrial size was severely dilated. No left atrial/left  atrial appendage thrombus was detected.   Right Atrium: Right atrial size was normal in size.   Pericardium: There is no evidence of pericardial effusion.   Mitral Valve: The mitral valve is myxomatous. There is moderate  holosystolic prolapse of the middle segment of the anterior leaflet of the  mitral valve. Mild to moderate mitral valve regurgitation.   Tricuspid Valve: The tricuspid valve is normal in structure. Tricuspid  valve regurgitation is trivial.   Aortic Valve: The aortic valve is tricuspid. Aortic valve regurgitation is  severe. Aortic valve sclerosis is present, with no evidence of aortic  valve stenosis.   Pulmonic Valve: The pulmonic valve was grossly normal. Pulmonic valve  regurgitation is not visualized.   Aorta: Aortic dilatation noted. There is moderate dilatation of the aortic  root,  measuring 47 mm. There is moderate (Grade III) plaque involving the  descending aorta.   IAS/Shunts: No atrial level shunt detected by color flow Doppler.   Additional Comments: AI is central with eccentric course; likely due to  dilated aortic root. 3D was performed not requiring image post processing  on an independent workstation and was abnormal.   Redell Shallow MD  Electronically signed by Redell Shallow MD  Signature Date/Time: 12/25/2023/9:40:08 AM        Final  hysicians  Panel Physicians Referring Physician Case Authorizing Physician  Elmira Newman PARAS, MD (Primary)     Procedures  RIGHT/LEFT HEART CATH AND CORONARY ANGIOGRAPHY   Conclusion  Coronary angiography 12/25/2023: LM: Normal LAD: Prox 30% , distal 40% disease Ramus: Small, no significant disease Lcx: No significant disease RCA: Large, dominant          RPLA focal 40% disease   LVEDP 30 mmHg   Right heart catheterization 12/25/2023: RA: 7 mmHg RV: 27/3 mmHg PA: 30/15 mmHg, mPAP 16 mmHg PCW: 12 mmHg   AO sats: 97% PA sats: 69%   CO: 3.9 L/min CI: 2.3 L/min/m2   Mild nonobstructive coronary artery disease Elevated LVEDP in the setting of severe aortic regurgitation and reduced LVEF Refer to cardiothoracic surgery for consideration for aortic valve and possible aortic root replacement   Manish PARAS Elmira, MD           Recommendations  Antiplatelet/Anticoag No indication for antiplatelet therapy at this time .   Indications  Nonrheumatic aortic valve insufficiency [I35.1 (ICD-10-CM)]   Clinical Presentation  CHF/Shock Congestive heart failure present. NYHA Class II. No shock present.   Procedural Details  Technical Details Procedures: 1. Selective left and right coronary angiography 2. Left heart catheterization 3. Conscious sedation monitoring 6 min  Indication: Aortic regurgitation  History: 73 y.o. female with  hypertension, hyperlipidemia, remote h/o stroke,  s/p splenectomy for hereditary spherocytosis, mild to moderate mitral regurgitation, moderate to severe aortic valve regurgitation, exertional dyspnea, LV dysfunction   Diagnostic Angiography: Catheter/s advances over guidewire under fluoroscopy Left coronary artery: 5 Fr JL 4  Right coronary artery: 5 Fr JR 4 Left heart catheterization: 5 Fr JR 4   Anticoagulation:  3500 units heparin   Total contrast used: 25 cc   Hemostasis: TR band  Total fluoro time: 2.9 min Air Kerma: 63 mGy  During this procedure the patient is administered a total of Versed  1 mg and Fentanyl  25 mcg to achieve and maintain moderate conscious sedation.  The patient's heart rate, blood pressure, and oxygen saturation are monitored continuously during the procedure. The period of conscious sedation is 6 minutes, of which I was present face-to-face 100% of this time. Allena Blackwater, RT is an independent, trained observer who assisted in the monitoring of the patient's level of consciousness.   Total sedation time: 6 minutes.  All wires and catheters removed out of the body at the end of the procedure Final angiogram showed no dissection/perforation  Complications: None        Estimated blood loss <50 mL.   During this procedure medications were administered to achieve and maintain moderate conscious sedation while the patient's heart rate, blood pressure, and oxygen saturation were continuously monitored and I was present face-to-face 100% of this time. Charlene Haire Rad Tech and [employer] Cardiovascular Specialist are independent, trained observers who assisted in the monitoring of the patient's level of consciousness.   Medications (Filter: Administrations occurring from 1019 to 1108 on 12/25/23) lidocaine  (PF) (XYLOCAINE ) 1 % injection (mL)  Total volume: 4 mL Date/Time Rate/Dose/Volume Action   12/25/23 1036 2 mL Given   1038 2 mL Given   Radial Cocktail/Verapamil  only (mL)  Total volume: 10  mL Date/Time Rate/Dose/Volume Action   12/25/23 1040 10 mL Given   midazolam  (VERSED ) injection (mg)  Total dose: 1 mg Date/Time Rate/Dose/Volume Action   12/25/23 1048 1 mg Given   fentaNYL  (SUBLIMAZE ) injection (mcg)  Total dose: 25 mcg Date/Time Rate/Dose/Volume Action  12/25/23 1048 25 mcg Given   heparin  sodium (porcine) injection (Units)  Total dose: 3,500 Units Date/Time Rate/Dose/Volume Action   12/25/23 1050 3,500 Units Given   iohexol  (OMNIPAQUE ) 350 MG/ML injection (mL)  Total volume: 25 mL Date/Time Rate/Dose/Volume Action   12/25/23 1100 25 mL Given   Heparin  (Porcine) in NaCl 2000-0.9 UNIT/L-% SOLN (mL)  Total volume: 1,000 mL Date/Time Rate/Dose/Volume Action   12/25/23 1050 1,000 mL Given    Sedation Time  Sedation Time Physician-1: 6 minutes 37 seconds Contrast     Administrations occurring from 1019 to 1108 on 12/25/23:  Medication Name Total Dose  iohexol  (OMNIPAQUE ) 350 MG/ML injection 25 mL   Radiation/Fluoro  Fluoro time: 2.9 (min) DAP: 4.1 (Gycm2) Cumulative Air Kerma: 63.4 (mGy) Complications  Complications documented before study signed (12/25/2023 11:09 AM)   No complications were associated with this study.  Documented by Sharran Frieze - 12/25/2023 11:01 AM     Coronary Findings  Diagnostic Dominance: Right Left Anterior Descending  Prox LAD lesion is 30% stenosed. The lesion is mildly calcified.  Dist LAD lesion is 40% stenosed.    Ramus Intermedius  Vessel is small.    Right Coronary Artery    Right Posterior Atrioventricular Artery  RPAV lesion is 40% stenosed.    Intervention   No interventions have been documented.   Right Heart  Right Heart Pressures Right heart catheterization 12/25/2023: RA: 7 mmHg RV: 27/3 mmHg PA: 30/15 mmHg, mPAP 16 mmHg PCW: 12 mmHg  AO sats: 97% PA sats: 69%  CO: 3.9 L/min CI: 2.3 L/min/m2   Left Heart  Left Ventricle LV end diastolic pressure is moderately elevated.   Coronary  Diagrams  Diagnostic Dominance: Right  Intervention   Implants   No implant documentation for this case.   Syngo Images   Show images for CARDIAC CATHETERIZATION Images on Long Term Storage   Show images for Dilauro, Emelly Kirkman Link to Procedure Log  Procedure Log    Hemo Data  Flowsheet Row Most Recent Value  Fick Cardiac Output 3.97 L/min  Fick Cardiac Output Index 2.38 (L/min)/BSA  Aortic Mean Gradient 9.31 mmHg  Aortic Peak Gradient 4.1 mmHg  Aortic Valve Area 1.36  Aortic Value Area Index 0.81 cm2/BSA  RA A Wave 9 mmHg  RA V Wave 7 mmHg  RA Mean 7 mmHg  RV Systolic Pressure 27 mmHg  RV Diastolic Pressure 3 mmHg  RV EDP 9 mmHg  PA Systolic Pressure 30 mmHg  PA Diastolic Pressure 15 mmHg  PA Mean 16 mmHg  PW A Wave 19 mmHg  PW V Wave 14 mmHg  PW Mean 12 mmHg  AO Systolic Pressure 161 mmHg  AO Diastolic Pressure 112 mmHg  AO Mean 100 mmHg  LV Systolic Pressure 163 mmHg  LV Diastolic Pressure 61 mmHg  LV EDP 32 mmHg  Arterial Occlusion Pressure Extended Systolic Pressure 163 mmHg  Arterial Occlusion Pressure Extended Diastolic Pressure 61 mmHg  Arterial Occlusion Pressure Extended Mean Pressure 99 mmHg  Left Ventricular Apex Extended Systolic Pressure 164 mmHg  LVp Diastolic Pressure 11 mmHg  Left Ventricular Apex Extended EDP Pressure 29 mmHg  QP/QS 1  TPVR Index 6.73 HRUI  TSVR Index 42.09 HRUI  PVR SVR Ratio 0.04  TPVR/TSVR Ratio 0.16    Narrative & Impression  CLINICAL DATA:  Aortic disease   EXAM: CT ANGIOGRAPHY CHEST WITH CONTRAST   TECHNIQUE: Multidetector CT imaging of the chest was performed using the standard protocol during bolus administration of intravenous  contrast. Multiplanar CT image reconstructions and MIPs were obtained to evaluate the vascular anatomy.   RADIATION DOSE REDUCTION: This exam was performed according to the departmental dose-optimization program which includes automated exposure control, adjustment of the  mA and/or kV according to patient size and/or use of iterative reconstruction technique.   CONTRAST:  OMNIPAQUE  IOHEXOL  350 MG/ML SOLN   COMPARISON:  None Available.   FINDINGS: Cardiovascular: Aneurysmal dilatation of the aortic root measuring 5.2 cm at the sinuses of Valsalva. No effacement of the sino-tubular junction. The tubular portion of the ascending thoracic aorta is ectatic but not aneurysmal at 3.8 cm. The transverse and descending thoracic aorta are normal in caliber. Four vessel arch anatomy. The left vertebral artery arises directly from the aorta. Scattered atherosclerotic plaque throughout the aorta and coronary arteries. Mild cardiomegaly with left atrial enlargement and mild left ventricular dilatation. No pericardial effusion.   Mediastinum/Nodes: Unremarkable CT appearance of the thyroid  gland. No suspicious mediastinal or hilar adenopathy. No soft tissue mediastinal mass. The thoracic esophagus is unremarkable.   Lungs/Pleura: No suspicious pulmonary mass or nodule. Mild dependent atelectasis.   Upper Abdomen: No acute abnormality within the upper abdomen. 3-4 mm nonobstructing stone in the lower pole collecting system of the left kidney.   Musculoskeletal: No acute osseous abnormality. Extensive multilevel degenerative disc disease.   Review of the MIP images confirms the above findings.   IMPRESSION: 1. 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 2. No effacement of the sino-tubular junction and mildly ectatic but nonaneurysmal ascending aorta with normal transverse and descending thoracic aorta diameters. 3. Aortic and coronary artery atherosclerotic vascular calcifications. 4. Four vessel arch anatomy. The left vertebral artery arises directly from the aorta. 5. Cardiomegaly with prominent left atrial enlargement and mild left ventricular dilatation. 6. Nonobstructing left lower pole  nephrolithiasis. 7. Multilevel degenerative disc disease.   Aortic Atherosclerosis (ICD10-I70.0); Aortic aneurysm NOS (ICD10-I71.9).     Electronically Signed   By: Wilkie Lent M.D.   On: 01/06/2024 16:06      Impression:  This 73 year old woman has stage D, severe, symptomatic aortic insufficiency with NYHA class II symptoms of exertional fatigue and shortness of breath.  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  She has mildly decreased left ventricular systolic function with an ejection fraction of 45 to 50% with global hypokinesis and a moderately dilated left ventricular cavity.  I agree that replacement of her aortic valve and aortic root aneurysm is indicated for relief of her symptoms and to prevent progressive left ventricular dysfunction and aortic dissection.  Given her age I would recommend replacement using a bioprosthetic valved conduit with reimplantation of her coronary arteries.  She has mild nonobstructive coronary disease and mild to moderate mitral regurgitation due to myxomatous mitral valve with anterior leaflet prolapse.  I do not think these require intervention.  I reviewed the CT and echo images with the patient and her husband and answered their questions. I discussed the operative procedure with the patient and her husband including alternatives, benefits and risks; including but not limited to bleeding, blood transfusion, infection, stroke, myocardial infarction, heart block requiring a permanent pacemaker, organ dysfunction, and death.  Liliah Kirkman Melkonian understands and agrees to proceed.     Plan:  She is going to think about the timing of surgery and will call us  back to schedule [place]  procedure using a bioprosthetic valve conduit.  I spent 60 minutes performing this consultation and > 50% of this time was spent face to face counseling and coordinating the care of this patient's severe  symptomatic aortic insufficiency and aortic root aneurysm.   Dorise MARLA Fellers, MD [facility] [phone]

## 2024-01-26 ENCOUNTER — Encounter: Payer: Self-pay | Admitting: *Deleted

## 2024-01-26 ENCOUNTER — Other Ambulatory Visit: Payer: Self-pay | Admitting: *Deleted

## 2024-01-26 DIAGNOSIS — I351 Nonrheumatic aortic (valve) insufficiency: Secondary | ICD-10-CM

## 2024-01-26 DIAGNOSIS — I7121 Aneurysm of the ascending aorta, without rupture: Secondary | ICD-10-CM

## 2024-02-04 ENCOUNTER — Ambulatory Visit: Admitting: Gastroenterology

## 2024-02-04 ENCOUNTER — Encounter: Payer: Self-pay | Admitting: Gastroenterology

## 2024-02-04 VITALS — BP 120/64 | HR 62 | Ht 62.0 in | Wt 143.0 lb

## 2024-02-04 DIAGNOSIS — I10 Essential (primary) hypertension: Secondary | ICD-10-CM | POA: Diagnosis not present

## 2024-02-04 DIAGNOSIS — K51319 Ulcerative (chronic) rectosigmoiditis with unspecified complications: Secondary | ICD-10-CM

## 2024-02-04 DIAGNOSIS — M810 Age-related osteoporosis without current pathological fracture: Secondary | ICD-10-CM | POA: Diagnosis not present

## 2024-02-04 DIAGNOSIS — E7800 Pure hypercholesterolemia, unspecified: Secondary | ICD-10-CM | POA: Diagnosis not present

## 2024-02-04 NOTE — Patient Instructions (Signed)
 Continue Entyvio   You will due for fecal calprotectin in 2 to 3 months.  Please follow up in 6 months.  Thank you for entrusting me with your care and for choosing [facility], Dr. Elspeth Naval    _______________________________________________________  If your blood pressure at your visit was 140/90 or greater, please contact your primary care physician to follow up on this.  _______________________________________________________  If you are age 24 or older, your body mass index should be between 23-30. Your Body mass index is 26.16 kg/m. If this is out of the aforementioned range listed, please consider follow up with your Primary Care Provider.  If you are age 74 or younger, your body mass index should be between 19-25. Your Body mass index is 26.16 kg/m. If this is out of the aformentioned range listed, please consider follow up with your Primary Care Provider.   ________________________________________________________  The [facility] providers would like to encourage you to use MYCHART to communicate with providers for non-urgent requests or questions.  Due to long hold times on the telephone, sending your provider a message by [facility] may be a faster and more efficient way to get a response.  Please allow 48 business hours for a response.  Please remember that this is for non-urgent requests.  _______________________________________________________  [facility] is using a team-based approach to care.  Your team is made up of your doctor and two to three APPS. Our APPS (Nurse Practitioners and Physician Assistants) work with your physician to ensure care continuity for you. They are fully qualified to address your health concerns and develop a treatment plan. They communicate directly with your gastroenterologist to care for you. Seeing the Advanced Practice Practitioners on your physician's team can help you by facilitating care more promptly, often  allowing for earlier appointments, access to diagnostic testing, procedures, and other specialty referrals.

## 2024-02-04 NOTE — Pre-Procedure Instructions (Signed)
 Surgical Instructions   Your procedure is scheduled on Monday, August 4th. Report to [facility] Main Entrance A at 05:30 A.M., then check in with the Admitting office. Any questions or running late day of surgery: call [phone]  Questions prior to your surgery date: call [phone], Monday-Friday, 8am-4pm. If you experience any cold or flu symptoms such as cough, fever, chills, shortness of breath, etc. between now and your scheduled surgery, please notify us  at the above number.     Remember:  Do not eat or drink after midnight the night before your surgery   Take these medicines the morning of surgery with A SIP OF WATER   ciprofloxacin  (CIPRO )  metoprolol  succinate (TOPROL -XL)  rosuvastatin (CRESTOR)    **STOP ASPIRIN  1 WEEK PRIOR TO SURGERY**   One week prior to surgery, STOP taking any Aleve, Naproxen, Ibuprofen, Motrin, Advil, Goody's, BC's, all herbal medications, fish oil, and non-prescription vitamins.                     Do NOT Smoke (Tobacco/Vaping) for 24 hours prior to your procedure.  If you use a CPAP at night, you may bring your mask/headgear for your overnight stay.   You will be asked to remove any contacts, glasses, piercing's, hearing aid's, dentures/partials prior to surgery. Please bring cases for these items if needed.    Patients discharged the day of surgery will not be allowed to drive home, and someone needs to stay with them for 24 hours.  SURGICAL WAITING ROOM VISITATION Patients may have no more than 2 support people in the waiting area - these visitors may rotate.   Pre-op nurse will coordinate an appropriate time for 1 ADULT support person, who may not rotate, to accompany patient in pre-op.  Children under the age of 70 must have an adult with them who is not the patient and must remain in the main waiting area with an adult.  If the patient needs to stay at the hospital during part of their recovery, the visitor guidelines for inpatient  rooms apply.  Please refer to the [facility] website for the visitor guidelines for any additional information.   If you received a COVID test during your pre-op visit  it is requested that you wear a mask when out in public, stay away from anyone that may not be feeling well and notify your surgeon if you develop symptoms. If you have been in contact with anyone that has tested positive in the last 10 days please notify you surgeon.      Pre-operative CHG Bathing Instructions   You can play a key role in reducing the risk of infection after surgery. Your skin needs to be as free of germs as possible. You can reduce the number of germs on your skin by washing with CHG (chlorhexidine  gluconate) soap before surgery. CHG is an antiseptic soap that kills germs and continues to kill germs even after washing.   DO NOT use if you have an allergy to chlorhexidine /CHG or antibacterial soaps. If your skin becomes reddened or irritated, stop using the CHG and notify one of our RNs at [phone].              TAKE A SHOWER THE NIGHT BEFORE SURGERY AND THE DAY OF SURGERY    Please keep in mind the following:  DO NOT shave, including legs and underarms, 48 hours prior to surgery.   You may shave your face before/day of surgery.  Place clean  sheets on your bed the night before surgery Use a clean washcloth (not used since being washed) for each shower. DO NOT sleep with pet's night before surgery.  CHG Shower Instructions:  Wash your face and private area with normal soap. If you choose to wash your hair, wash first with your normal shampoo.  After you use shampoo/soap, rinse your hair and body thoroughly to remove shampoo/soap residue.  Turn the water  OFF and apply half the bottle of CHG soap to a CLEAN washcloth.  Apply CHG soap ONLY FROM YOUR NECK DOWN TO YOUR TOES (washing for 3-5 minutes)  DO NOT use CHG soap on face, private areas, open wounds, or sores.  Pay special attention to the area  where your surgery is being performed.  If you are having back surgery, having someone wash your back for you may be helpful. Wait 2 minutes after CHG soap is applied, then you may rinse off the CHG soap.  Pat dry with a clean towel  Put on clean pajamas    Additional instructions for the day of surgery: DO NOT APPLY any lotions, deodorants, cologne, or perfumes.   Do not wear jewelry or makeup Do not wear nail polish, gel polish, artificial nails, or any other type of covering on natural nails (fingers and toes) Do not bring valuables to the hospital. [facility] is not responsible for valuables/personal belongings. Put on clean/comfortable clothes.  Please brush your teeth.  Ask your nurse before applying any prescription medications to the skin.

## 2024-02-04 NOTE — Progress Notes (Signed)
 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 Obie. She was initially treated with Lialda  2.4gm daily as well as Hydrocortizone enemas which helped her symptoms. She has been maintained with Lialda  since that time, Rowasa  added to regimen.  Had persistent elevation in fecal calprotectin, colonoscopy with mild inflammation, transitioned to Entyvio  Spring 2025.   SINCE LAST VISIT   73 year old female here for a follow-up visit for left-sided colitis.  Recall she was on Lialda  and Rowasa  enemas for this in recent years.  She had mildly elevated fecal calprotectin on the regimen to the low 100s.  She was having some occasional urgency and cramps but in general had been doing okay.  She did require the Rowasa  to feel well, she did not want to use enema therapy long-term after discussion of options, and ultimately we transition to Entyvio  in March.  She has had 3 infusions so far, off Lialda  and Rowasa  now and states she is feeling really well.  Her bowel symptoms have resolved.  She has normal stools without bleeding or urgency.  She appears to be tolerating the regimen well.  Her next dose is on August 16.  The main issue she has had with her health since have last seen her has been diagnosis of severe aortic regurgitation in the setting of an aortic aneurysm.  She has been evaluated by cardiology and referred to cardiothoracic surgery.  She is scheduled for aortic valve replacement and repair of the aneurysm on Monday.  She is a bit anxious about this as is her husband and has questions about the surgery and if she can continue Entyvio  in a few weeks.  We discussed this for a bit.  Otherwise she feels well without complaints today       Prior colonoscopy exams: Colonoscopy 02/25/22: The perianal and digital rectal examinations were normal. - The terminal ileum appeared normal. - Multiple small-mouthed diverticula were found in the sigmoid colon. - Areas of  mildly erythematous mucosa was found in the recto-sigmoid colon and in the distal sigmoid colon near areas of diverticulum. Unclear if this represents segmental / diverticular related colitis more so than ulcerative colitis? Biopsies were taken with a cold forceps for histology. - The exam was otherwise without abnormality. - Biopsies were taken with a cold forceps in the rectum for histology.   1. Surgical [P], colon, sigmoid/recto-sigmoid - MINIMALLY ACTIVE CHRONIC COLITIS CONSISTENT WITH INFLAMMATORY BOWEL DISEASE. - NEGATIVE FOR DYSPLASIA. 2. Surgical [P], colon, rectum - MILDLY ACTIVE CHRONIC COLITIS CONSISTENT WITH INFLAMMATORY BOWEL DISEASE. - NEGATIVE FOR DYSPLASIA.     Colonoscopy 11/28/19: The perianal and digital rectal examinations were normal. - Multiple small-mouthed diverticula were found in the sigmoid colon and ascending colon. - A small area of granular mucosa with loss of vascularity - mild was found in the rectosigmoid colon in an area of diverticulosis - unclear if superficial changes from diverticulitis vs. mild active coliis. - Internal hemorrhoids were found during retroflexion. The hemorrhoids were small. - The exam was otherwise without abnormality. No other areas of inflammation appreciated. No polyps. - Biopsies were taken with a cold forceps in the rectum, in the sigmoid colon, in the descending colon and at the splenic flexure for histology.   1. Surgical [P], random left colon bx - FOCAL EROSION WITH MILD INFLAMMATION. - NEGATIVE FOR DYSPLASIA. 2. Surgical [P], colon, rectum/rectosigmoid - MINIMALLY ACTIVE CHRONIC COLITIS. - NEGATIVE FOR DYSPLASIA. - NO GRANULOMAS.  Colonoscopy 09/10/2017 - Preparation of the colon was fair, could not clear the cecal cap. - The examined portion of the ileum was normal. - One 4 mm polyp in the transverse colon, removed with a cold snare. Resected and retrieved. - Diverticulosis in the sigmoid colon. - The  examination was otherwise normal on direct and retroflexion views. No active inflammation. - Biopsies for surveillance were taken from the descending colon, sigmoid colon and rectum   Path showed focal active colitis, adenomatous polyp   Colonoscopy December 2015 - left sided colitis, path c/w chronic active colitis        Fecal calprotectin 03/2023: 192 - increased lialda  back to 4 tabs per day and continued Rowasa    Fecal calprotectin 06/24/23: 123     CT abdomen / pelvis 11/13/23: IMPRESSION: No acute findings in the abdomen or pelvis.   Cardiology workup recently: Cardiac cath 12/25/23: Mild nonobstructive coronary artery disease Elevated LVEDP in the setting of severe aortic regurgitation and reduced LVEF Refer to cardiothoracic surgery for consideration for aortic valve and possible aortic root replacement  TEE 12/25/23: IMPRESSIONS   1. AI is central with eccentric course; likely due to dilated aortic  root.   2. Left ventricular ejection fraction, by estimation, is 45 to 50%. The  left ventricle has mildly decreased function. The left ventricle  demonstrates global hypokinesis. The left ventricular internal cavity size  was moderately dilated.   3. Right ventricular systolic function is normal. The right ventricular  size is normal.   4. Left atrial size was severely dilated. No left atrial/left atrial  appendage thrombus was detected.   5. 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 There is moderate holosystolic prolapse of the middle  segment of the anterior leaflet of the mitral valve.   6. The aortic valve is tricuspid. Aortic valve regurgitation is severe.  Aortic valve sclerosis is present, with no evidence of aortic valve  stenosis.   7. Aortic dilatation noted. There is moderate dilatation of the aortic  root, measuring 47 mm. There is Moderate (Grade III) plaque involving the  descending aorta.   8. 3D performed of the  mitral valve and 3D performed of the aortic valve.    CTA 01/05/24: IMPRESSION: 1. 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 2. No effacement of the sino-tubular junction and mildly ectatic but nonaneurysmal ascending aorta with normal transverse and descending thoracic aorta diameters. 3. Aortic and coronary artery atherosclerotic vascular calcifications. 4. Four vessel arch anatomy. The left vertebral artery arises directly from the aorta. 5. Cardiomegaly with prominent left atrial enlargement and mild left ventricular dilatation. 6. Nonobstructing left lower pole nephrolithiasis. 7. Multilevel degenerative disc disease.    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   and wrist   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   Ulcerative colitis (HCC)    Weakness of right hand    due to stroke     Past Surgical History:  Procedure Laterality Date   COLONSCOPY     RIGHT/LEFT HEART CATH AND CORONARY ANGIOGRAPHY N/A 12/25/2023   Procedure: RIGHT/LEFT HEART CATH AND CORONARY ANGIOGRAPHY;  Surgeon: Elmira Newman PARAS, MD;  Location: [facility] INVASIVE CV LAB;  Service: Cardiovascular;  Laterality: N/A;   SHOULDER ARTHROSCOPY W/ ROTATOR CUFF REPAIR Right 2010   SPLENECTOMY, 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   TOTAL KNEE ARTHROPLASTY Left 2001   TOTAL KNEE ARTHROPLASTY Right 12/31/2015    Procedure: RIGHT TOTAL KNEE ARTHROPLASTY;  Surgeon: Donnice Car, MD;  Location: [facility] ORS;  Service: Orthopedics;  Laterality: Right;   TRANSESOPHAGEAL ECHOCARDIOGRAM (CATH LAB) N/A 12/25/2023   Procedure: TRANSESOPHAGEAL ECHOCARDIOGRAM;  Surgeon: Pietro Redell RAMAN, MD;  Location: [facility] INVASIVE CV LAB;  Service: Cardiovascular;  Laterality: N/A;   Family History  Problem Relation Age of Onset   Diabetes Father    Mental illness Father    Narcolepsy Father    Heart disease Paternal Grandmother    Diabetes Paternal Grandmother    Heart attack Maternal Grandfather    Stroke Maternal Grandmother    Colon cancer Neg Hx    Esophageal cancer Neg Hx    Rectal cancer Neg Hx    Stomach cancer Neg Hx    Social History   Tobacco Use   Smoking status: Former    Current packs/day: 0.00    Types: Cigarettes    Quit date: 06/20/1989    Years since quitting: 34.6   Smokeless tobacco: Never   Tobacco comments:    smoked occasional cigarettes for 5 years  Vaping Use   Vaping status: Never Used  Substance Use Topics   Alcohol use: No    Alcohol/week: 0.0 standard drinks of alcohol   Drug use: No   Current Outpatient Medications  Medication Sig Dispense Refill   alendronate (FOSAMAX) 70 MG tablet Take 70 mg by mouth once a week. Take with a full glass of water  on an empty stomach.     Apoaequorin (PREVAGEN PO) Take 1 tablet by mouth daily.     aspirin  EC 81 MG tablet Take 1 tablet (81 mg total) by mouth daily. Swallow whole.     calcium carbonate (OSCAL) 1500 (600 Ca) MG TABS tablet Take by mouth 2 (two) times daily with a meal.     ciprofloxacin  (CIPRO ) 500 MG tablet Take 1 tablet (500 mg total) by mouth 2 (two) times daily. 10 tablet 0   ferrous sulfate  325 (65 FE) MG tablet Take 325 mg by mouth daily.     furosemide  (LASIX ) 20 MG tablet Take 1 tablet (20 mg total) by mouth daily. 90 tablet 3   losartan (COZAAR) 50 MG tablet Take 50 mg by mouth daily.     metoprolol  succinate (TOPROL -XL) 50  MG 24 hr tablet Take 50 mg by mouth daily. Take with or immediately following a meal.     rosuvastatin (CRESTOR) 20 MG tablet Take 20 mg by mouth daily.     thiamine (VITAMIN B-1) 100 MG tablet Take 100 mg by mouth daily.     Vedolizumab  (ENTYVIO  IV) Inject 1 Dose as directed every 8 (eight) weeks. Experimental every two months, given at the MD office     VITAMIN D  PO Take  1 tablet by mouth daily.     No current facility-administered medications for this visit.   Allergies  Allergen Reactions   Clindamycin Hcl Other (See Comments)   Codeine Nausea And Vomiting   Morphine  And Codeine     Causes hallucinations      Review of Systems: All systems reviewed and negative except where noted in HPI.    CT ANGIO CHEST AORTA W/CM & OR WO/CM Result Date: 01/06/2024 CLINICAL DATA:  Aortic disease EXAM: CT ANGIOGRAPHY CHEST WITH CONTRAST TECHNIQUE: Multidetector CT imaging of the chest was performed using the standard protocol during bolus administration of intravenous contrast. Multiplanar CT image reconstructions and MIPs were obtained to evaluate the vascular anatomy. RADIATION DOSE REDUCTION: This exam was performed according to the departmental dose-optimization program which includes automated exposure control, adjustment of the mA and/or kV according to patient size and/or use of iterative reconstruction technique. CONTRAST:  OMNIPAQUE  IOHEXOL  350 MG/ML SOLN COMPARISON:  None Available. FINDINGS: Cardiovascular: Aneurysmal dilatation of the aortic root measuring 5.2 cm at the sinuses of Valsalva. No effacement of the sino-tubular junction. The tubular portion of the ascending thoracic aorta is ectatic but not aneurysmal at 3.8 cm. The transverse and descending thoracic aorta are normal in caliber. Four vessel arch anatomy. The left vertebral artery arises directly from the aorta. Scattered atherosclerotic plaque throughout the aorta and coronary arteries. Mild cardiomegaly with left atrial  enlargement and mild left ventricular dilatation. No pericardial effusion. Mediastinum/Nodes: Unremarkable CT appearance of the thyroid  gland. No suspicious mediastinal or hilar adenopathy. No soft tissue mediastinal mass. The thoracic esophagus is unremarkable. Lungs/Pleura: No suspicious pulmonary mass or nodule. Mild dependent atelectasis. Upper Abdomen: No acute abnormality within the upper abdomen. 3-4 mm nonobstructing stone in the lower pole collecting system of the left kidney. Musculoskeletal: No acute osseous abnormality. Extensive multilevel degenerative disc disease. Review of the MIP images confirms the above findings. IMPRESSION: 1. 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 2. No effacement of the sino-tubular junction and mildly ectatic but nonaneurysmal ascending aorta with normal transverse and descending thoracic aorta diameters. 3. Aortic and coronary artery atherosclerotic vascular calcifications. 4. Four vessel arch anatomy. The left vertebral artery arises directly from the aorta. 5. Cardiomegaly with prominent left atrial enlargement and mild left ventricular dilatation. 6. Nonobstructing left lower pole nephrolithiasis. 7. Multilevel degenerative disc disease. Aortic Atherosclerosis (ICD10-I70.0); Aortic aneurysm NOS (ICD10-I71.9). Electronically Signed   By: Wilkie Lent M.D.   On: 01/06/2024 16:06    Physical Exam: BP 120/64   Pulse 62   Ht 5' 2 (1.575 m)   Wt 143 lb (64.9 kg)   BMI 26.16 kg/m  Constitutional: Pleasant,well-developed, female in no acute distress. Neurological: Alert and oriented to person place and time. Psychiatric: Normal mood and affect. Behavior is normal.   ASSESSMENT: 73 y.o. female here for assessment of the following  1. Ulcerative rectosigmoiditis with complication (HCC)    As above, had some benefit with combination of oral Lialda  and Rowasa , but still had some mildly active symptoms along  with elevated fecal calprotectin.  After discussion of options she really did not like taking the Rowasa  and wanted to use something else long-term.  We ultimately transition to Entyvio  this past spring.  She has had 3 doses so far, her symptoms seemingly have resolved, she tolerates the infusion and feeling well.  We discussed long-term plan.  We will continue Entyvio  for now as long as  it is working well, she tolerates it, and covered by insurance.  We discussed this drugs excellent safety profile.  Main risk is that of C. difficile.  She will need to be mindful of this as she undergoes her cardiac surgery next week, if she develops diarrhea in the hospital or in the setting of antibiotic use would have low threshold to check her for C. difficile.  Otherwise I think as long as she recovers from her surgery okay she could proceed with her next infusion on August 16.  I would like her to come back for a fecal calprotectin in 2 to 3 months after she has recovered from her aortic surgery and we will trend this.  I will see her back in the office in 6 months for reassessment and can consider colonoscopy at that time assuming she makes a good recover from her cardiac surgery.  She agrees with the plan, all questions answered.  She will let me know if any questions or concerns in the interim.   PLAN: - continue Entyvio  - recall fecal calprotectin in 2-3 months - f/u clinic visit in 6 months - consider colonoscopy at next visit once recovered from her cardiac surgery  Marcey Naval, MD [facility]

## 2024-02-05 ENCOUNTER — Ambulatory Visit (HOSPITAL_COMMUNITY)
Admission: RE | Admit: 2024-02-05 | Discharge: 2024-02-05 | Disposition: A | Discharge status: Home / Self Care | Source: Ambulatory Visit | Attending: Surgery | Admitting: Surgery

## 2024-02-05 ENCOUNTER — Other Ambulatory Visit: Payer: Self-pay

## 2024-02-05 ENCOUNTER — Encounter (HOSPITAL_COMMUNITY): Payer: Self-pay

## 2024-02-05 ENCOUNTER — Encounter (HOSPITAL_COMMUNITY)
Admission: RE | Admit: 2024-02-05 | Discharge: 2024-02-05 | Disposition: A | Discharge status: Home / Self Care | Source: Ambulatory Visit | Attending: Surgery | Admitting: Surgery

## 2024-02-05 ENCOUNTER — Ambulatory Visit (HOSPITAL_COMMUNITY)

## 2024-02-05 VITALS — BP 154/65 | HR 62 | Temp 98.0°F | Resp 18 | Ht 62.0 in | Wt 144.6 lb

## 2024-02-05 DIAGNOSIS — I7121 Aneurysm of the ascending aorta, without rupture: Secondary | ICD-10-CM | POA: Insufficient documentation

## 2024-02-05 DIAGNOSIS — E871 Hypo-osmolality and hyponatremia: Secondary | ICD-10-CM | POA: Diagnosis not present

## 2024-02-05 DIAGNOSIS — Z8249 Family history of ischemic heart disease and other diseases of the circulatory system: Secondary | ICD-10-CM | POA: Diagnosis not present

## 2024-02-05 DIAGNOSIS — Z7983 Long term (current) use of bisphosphonates: Secondary | ICD-10-CM | POA: Diagnosis not present

## 2024-02-05 DIAGNOSIS — E785 Hyperlipidemia, unspecified: Secondary | ICD-10-CM | POA: Diagnosis not present

## 2024-02-05 DIAGNOSIS — J9811 Atelectasis: Secondary | ICD-10-CM | POA: Diagnosis not present

## 2024-02-05 DIAGNOSIS — Z833 Family history of diabetes mellitus: Secondary | ICD-10-CM | POA: Diagnosis not present

## 2024-02-05 DIAGNOSIS — Z8673 Personal history of transient ischemic attack (TIA), and cerebral infarction without residual deficits: Secondary | ICD-10-CM | POA: Diagnosis not present

## 2024-02-05 DIAGNOSIS — F17210 Nicotine dependence, cigarettes, uncomplicated: Secondary | ICD-10-CM | POA: Diagnosis not present

## 2024-02-05 DIAGNOSIS — I351 Nonrheumatic aortic (valve) insufficiency: Secondary | ICD-10-CM

## 2024-02-05 DIAGNOSIS — I340 Nonrheumatic mitral (valve) insufficiency: Secondary | ICD-10-CM | POA: Diagnosis not present

## 2024-02-05 DIAGNOSIS — I341 Nonrheumatic mitral (valve) prolapse: Secondary | ICD-10-CM | POA: Diagnosis not present

## 2024-02-05 DIAGNOSIS — Z01818 Encounter for other preprocedural examination: Secondary | ICD-10-CM | POA: Insufficient documentation

## 2024-02-05 DIAGNOSIS — I517 Cardiomegaly: Secondary | ICD-10-CM | POA: Diagnosis not present

## 2024-02-05 DIAGNOSIS — G4733 Obstructive sleep apnea (adult) (pediatric): Secondary | ICD-10-CM | POA: Diagnosis not present

## 2024-02-05 DIAGNOSIS — I110 Hypertensive heart disease with heart failure: Secondary | ICD-10-CM | POA: Diagnosis not present

## 2024-02-05 DIAGNOSIS — I480 Paroxysmal atrial fibrillation: Secondary | ICD-10-CM | POA: Diagnosis not present

## 2024-02-05 DIAGNOSIS — Z79899 Other long term (current) drug therapy: Secondary | ICD-10-CM | POA: Diagnosis not present

## 2024-02-05 DIAGNOSIS — I5022 Chronic systolic (congestive) heart failure: Secondary | ICD-10-CM | POA: Diagnosis not present

## 2024-02-05 DIAGNOSIS — E876 Hypokalemia: Secondary | ICD-10-CM | POA: Diagnosis not present

## 2024-02-05 DIAGNOSIS — K5900 Constipation, unspecified: Secondary | ICD-10-CM | POA: Diagnosis not present

## 2024-02-05 DIAGNOSIS — D6959 Other secondary thrombocytopenia: Secondary | ICD-10-CM | POA: Diagnosis not present

## 2024-02-05 DIAGNOSIS — Z96653 Presence of artificial knee joint, bilateral: Secondary | ICD-10-CM | POA: Diagnosis not present

## 2024-02-05 DIAGNOSIS — Z85828 Personal history of other malignant neoplasm of skin: Secondary | ICD-10-CM | POA: Diagnosis not present

## 2024-02-05 DIAGNOSIS — Q2543 Congenital aneurysm of aorta: Secondary | ICD-10-CM | POA: Diagnosis not present

## 2024-02-05 DIAGNOSIS — Z96611 Presence of right artificial shoulder joint: Secondary | ICD-10-CM | POA: Diagnosis not present

## 2024-02-05 HISTORY — DX: Nonrheumatic aortic (valve) insufficiency: I35.1

## 2024-02-05 HISTORY — DX: Hereditary spherocytosis: D58.0

## 2024-02-05 HISTORY — DX: Thoracic aortic aneurysm, without rupture, unspecified: I71.20

## 2024-02-05 LAB — URINALYSIS, ROUTINE W REFLEX MICROSCOPIC
Bilirubin Urine: NEGATIVE
Glucose, UA: NEGATIVE mg/dL
Hgb urine dipstick: NEGATIVE
Ketones, ur: NEGATIVE mg/dL
Leukocytes,Ua: NEGATIVE
Nitrite: NEGATIVE
Protein, ur: NEGATIVE mg/dL
Specific Gravity, Urine: 1.01 (ref 1.005–1.030)
pH: 5 (ref 5.0–8.0)

## 2024-02-05 LAB — CBC
HCT: 42.7 % (ref 36.0–46.0)
Hemoglobin: 14.7 g/dL (ref 12.0–15.0)
MCH: 32.5 pg (ref 26.0–34.0)
MCHC: 34.4 g/dL (ref 30.0–36.0)
MCV: 94.5 fL (ref 80.0–100.0)
Platelets: 237 K/uL (ref 150–400)
RBC: 4.52 MIL/uL (ref 3.87–5.11)
RDW: 12.9 % (ref 11.5–15.5)
WBC: 10 K/uL (ref 4.0–10.5)
nRBC: 0 % (ref 0.0–0.2)

## 2024-02-05 LAB — COMPREHENSIVE METABOLIC PANEL WITH GFR
ALT: 20 U/L (ref 0–44)
AST: 28 U/L (ref 15–41)
Albumin: 4.1 g/dL (ref 3.5–5.0)
Alkaline Phosphatase: 53 U/L (ref 38–126)
Anion gap: 9 (ref 5–15)
BUN: 14 mg/dL (ref 8–23)
CO2: 26 mmol/L (ref 22–32)
Calcium: 9.1 mg/dL (ref 8.9–10.3)
Chloride: 103 mmol/L (ref 98–111)
Creatinine, Ser: 0.6 mg/dL (ref 0.44–1.00)
GFR, Estimated: >60 mL/min (ref >60)
Glucose, Bld: 90 mg/dL (ref 70–99)
Potassium: 3.5 mmol/L (ref 3.5–5.1)
Sodium: 138 mmol/L (ref 135–145)
Total Bilirubin: 2.5 mg/dL — ABNORMAL HIGH (ref 0.0–1.2)
Total Protein: 6.7 g/dL (ref 6.5–8.1)

## 2024-02-05 LAB — APTT: aPTT: 34 s (ref 24–36)

## 2024-02-05 LAB — SURGICAL PCR SCREEN
MRSA, PCR: NEGATIVE
Staphylococcus aureus: NEGATIVE

## 2024-02-05 LAB — PROTIME-INR
INR: 1.1 (ref 0.8–1.2)
Prothrombin Time: 14.5 s (ref 11.4–15.2)

## 2024-02-05 MED ORDER — MILRINONE LACTATE IN DEXTROSE 20-5 MG/100ML-% IV SOLN
0.3000 ug/kg/min | INTRAVENOUS | Status: DC
  Filled 2024-02-05 (×2): qty 100

## 2024-02-05 MED ORDER — NITROGLYCERIN IN D5W 200-5 MCG/ML-% IV SOLN
2.0000 ug/min | INTRAVENOUS | Status: DC
  Filled 2024-02-05 (×2): qty 250

## 2024-02-05 MED ORDER — HEPARIN 30,000 UNITS/1000 ML (OHS) CELLSAVER SOLUTION
Status: DC
  Filled 2024-02-05 (×2): qty 1000

## 2024-02-05 MED ORDER — PLASMA-LYTE A IV SOLN
INTRAVENOUS | Status: DC
  Filled 2024-02-05 (×2): qty 2.5

## 2024-02-05 MED ORDER — MANNITOL 20 % IV SOLN
INTRAVENOUS | Status: DC
  Filled 2024-02-05 (×2): qty 13

## 2024-02-05 MED ORDER — EPINEPHRINE HCL 5 MG/250ML IV SOLN IN NS
0.0000 ug/min | INTRAVENOUS | Status: DC
  Filled 2024-02-05 (×2): qty 250

## 2024-02-05 MED ORDER — PHENYLEPHRINE HCL-NACL 20-0.9 MG/250ML-% IV SOLN
30.0000 ug/min | INTRAVENOUS | Status: AC
  Administered 2024-02-08: 20 ug/min via INTRAVENOUS
  Filled 2024-02-05 (×2): qty 250

## 2024-02-05 MED ORDER — CEFAZOLIN SODIUM-DEXTROSE 2-4 GM/100ML-% IV SOLN
2.0000 g | INTRAVENOUS | Status: AC
  Administered 2024-02-08: 2 g via INTRAVENOUS
  Filled 2024-02-05 (×2): qty 100

## 2024-02-05 MED ORDER — TRANEXAMIC ACID (OHS) BOLUS VIA INFUSION
15.0000 mg/kg | INTRAVENOUS | Status: AC
  Administered 2024-02-08: 973.5 mg via INTRAVENOUS
  Filled 2024-02-05 (×2): qty 974

## 2024-02-05 MED ORDER — INSULIN REGULAR(HUMAN) IN NACL 100-0.9 UT/100ML-% IV SOLN
INTRAVENOUS | Status: AC
  Administered 2024-02-08: .7 [IU]/h via INTRAVENOUS
  Filled 2024-02-05 (×2): qty 100

## 2024-02-05 MED ORDER — POTASSIUM CHLORIDE 2 MEQ/ML IV SOLN
80.0000 meq | INTRAVENOUS | Status: DC
  Filled 2024-02-05 (×2): qty 40

## 2024-02-05 MED ORDER — TRANEXAMIC ACID 1000 MG/10ML IV SOLN
1.5000 mg/kg/h | INTRAVENOUS | Status: AC
  Administered 2024-02-08: 1.5 mg/kg/h via INTRAVENOUS
  Filled 2024-02-05 (×2): qty 25

## 2024-02-05 MED ORDER — NOREPINEPHRINE 4 MG/250ML-% IV SOLN
0.0000 ug/min | INTRAVENOUS | Status: DC
  Filled 2024-02-05 (×2): qty 250

## 2024-02-05 MED ORDER — DEXMEDETOMIDINE HCL IN NACL 400 MCG/100ML IV SOLN
0.1000 ug/kg/h | INTRAVENOUS | Status: AC
  Administered 2024-02-08: .5 ug/kg/h via INTRAVENOUS
  Filled 2024-02-05 (×2): qty 100

## 2024-02-05 MED ORDER — VANCOMYCIN HCL 1250 MG/250ML IV SOLN
1250.0000 mg | INTRAVENOUS | Status: AC
  Administered 2024-02-08: 1250 mg via INTRAVENOUS
  Filled 2024-02-05 (×2): qty 250

## 2024-02-05 MED ORDER — TRANEXAMIC ACID (OHS) PUMP PRIME SOLUTION
2.0000 mg/kg | INTRAVENOUS | Status: DC
  Filled 2024-02-05 (×2): qty 1.3

## 2024-02-05 NOTE — Progress Notes (Signed)
 PCP - Dr. Alberta Sharps Cardiologist - Dr. Newman Lawrence  PPM/ICD - denies   Chest x-ray - 02/05/24 EKG - 02/05/24 Stress Test - around 15-20 years ago, normal, w/ Dr. Wonda ECHO - 12/25/23 Cardiac Cath - 12/25/23  Sleep Study - OSA+ CPAP - denies  DM- denies  Last dose of GLP1 agonist-  n/a   Blood Thinner Instructions: n/a Aspirin  Instructions: hold 1 week. Last dose 7/26  ERAS Protcol - no, NPO   COVID TEST- n/a   Anesthesia review: yes, cardiac hx  Patient denies shortness of breath, fever, cough and chest pain at PAT appointment   All instructions explained to the patient, with a verbal understanding of the material. Patient agrees to go over the instructions while at home for a better understanding. The opportunity to ask questions was provided.

## 2024-02-06 LAB — HEMOGLOBIN A1C
Hgb A1c MFr Bld: 5.4 % (ref 4.8–5.6)
Mean Plasma Glucose: 108 mg/dL

## 2024-02-06 NOTE — H&P (Signed)
 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  She also reports some chest heaviness.  She has had lower extremity edema which is improved with Lasix .  She has had occasional episodes of dizziness but no syncope.  She had a history of mitral valve prolapse which was mildly symptomatic and her last echocardiogram was in 2011.  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  There is mild to moderate mitral valve regurgitation with holosystolic prolapse of the middle segment of the anterior mitral leaflet.  The aortic valve was trileaflet with moderate to severe aortic insufficiency with a pressure half-time of 276 ms.  The aortic root was noted to be 4.5 cm.  Left ventricular ejection fraction was 45 to 50% with global hypokinesis and mild LV dilation to 5.9 cm.  A TEE was performed on 12/25/2023 showing ejection fraction of 45 to 50%.  The aortic valve was trileaflet with severe central AI with an eccentric course.  The aortic root was measured at 4.7 cm.  The mitral valve is myxomatous with moderate holosystolic prolapse of the middle segment of the anterior leaflet with mild to moderate mitral valve regurgitation.  Cardiac catheterization on the same day showed mild nonobstructive coronary disease with an elevated LVEDP of 30.  PA pressure was 30/15 with a mean of 16.  Wedge pressure was 12.  Right atrial pressure was 7.   CTA of the chest showed the aortic root to measure 5.2 cm.   The patient is here today with her husband.  She stays active walking and riding a stationary bicycle.       Past Medical History:  Diagnosis Date   Allergy     Apnea, sleep      no c-pap machine   Arthritis      OA   Basal cell carcinoma      on front chest   Blood in stool      LAST 2 MONTHS, MEDS CHNAGED FOR   GERD (gastroesophageal reflux disease)     Heart murmur     HFrEF (heart failure with reduced ejection fraction) (HCC) 01/20/2024   Hyperlipidemia     Hypertension     Kidney stones     Laryngitis      started monday night   Neuromuscular disorder (HCC)     Post-operative nausea and vomiting     Severe aortic regurgitation 12/21/2023   Stroke (HCC) 09/21/2007   Synovitis of right hand 12/29/2015    and wrist   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   Ulcerative colitis (HCC)     Weakness of right hand      due to stroke               Past Surgical History:  Procedure  Laterality Date   COLONSCOPY       RIGHT/LEFT HEART CATH AND CORONARY ANGIOGRAPHY N/A 12/25/2023    Procedure: RIGHT/LEFT HEART CATH AND CORONARY ANGIOGRAPHY;  Surgeon: Elmira Newman PARAS, MD;  Location: [facility] INVASIVE CV LAB;  Service: Cardiovascular;  Laterality: N/A;   SHOULDER ARTHROSCOPY W/ ROTATOR CUFF REPAIR Right 2010   SPLENECTOMY, 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 at 49 yers old   TOTAL KNEE ARTHROPLASTY Left 2001   TOTAL KNEE ARTHROPLASTY Right 12/31/2015    Procedure: RIGHT TOTAL KNEE ARTHROPLASTY;  Surgeon: Donnice Car, MD;  Location: [facility];  Service: Orthopedics;  Laterality: Right;   TRANSESOPHAGEAL ECHOCARDIOGRAM (CATH LAB) N/A 12/25/2023    Procedure: TRANSESOPHAGEAL ECHOCARDIOGRAM;  Surgeon: Pietro Redell RAMAN, MD;  Location: [facility] INVASIVE CV LAB;  Service: Cardiovascular;  Laterality: N/A;               Family History  Problem Relation Age of Onset   Diabetes Father     Mental illness Father     Narcolepsy Father      Heart disease Paternal Grandmother     Diabetes Paternal Grandmother     Heart attack Maternal Grandfather     Stroke Maternal Grandmother     Colon cancer Neg Hx     Esophageal cancer Neg Hx     Rectal cancer Neg Hx     Stomach cancer Neg Hx            Social History Social History  Social History         Tobacco Use   Smoking status: Former      Current packs/day: 0.00      Types: Cigarettes      Quit date: 06/20/1989      Years since quitting: 34.6   Smokeless tobacco: Never   Tobacco comments:      smoked occasional cigarettes for 5 years  Vaping Use   Vaping status: Never Used  Substance Use Topics   Alcohol use: No      Alcohol/week: 0.0 standard drinks of alcohol   Drug use: No              Current Outpatient Medications  Medication Sig Dispense Refill   alendronate (FOSAMAX) 70 MG tablet Take 70 mg by mouth once a week. Take with a full glass of water  on an empty stomach.       Apoaequorin (PREVAGEN PO) Take 1 tablet by mouth daily.       aspirin  EC 81 MG tablet Take 1 tablet (81 mg total) by mouth daily. Swallow whole.       calcium  carbonate (OSCAL) 1500 (600 Ca) MG TABS tablet Take by mouth 2 (two) times daily with a meal.       ciprofloxacin  (CIPRO ) 500 MG tablet Take 1 tablet (500 mg total) by mouth 2 (two) times daily. 10 tablet 0   furosemide  (LASIX ) 20 MG tablet Take 1 tablet (20 mg total) by mouth daily. 90 tablet 3   losartan (COZAAR) 50 MG tablet Take 50 mg by mouth daily.       metoprolol  succinate (TOPROL -XL) 50 MG 24 hr tablet Take 50 mg by mouth daily. Take with or immediately following a meal.       rosuvastatin  (CRESTOR ) 20 MG tablet Take 20 mg by mouth daily.       thiamine  (VITAMIN B-1) 100 MG tablet Take 100 mg by mouth daily.  Vedolizumab  (ENTYVIO  IV) Inject as directed See admin instructions. Experimental every two months, given at the MD office       VITAMIN D  PO Take 1 tablet by mouth daily.          No current  facility-administered medications for this visit.        Allergies       Allergies  Allergen Reactions   Clindamycin Hcl Other (See Comments)   Codeine Nausea And Vomiting   Morphine  And Codeine        Causes hallucinations         Review of Systems  Constitutional:  Positive for fatigue. Negative for activity change.  HENT:  Positive for hearing loss. Negative for dental problem.        Sees her dentist regularly  Eyes: Negative.   Respiratory:  Positive for chest tightness and shortness of breath.   Cardiovascular:  Positive for leg swelling. Negative for chest pain and palpitations.  Gastrointestinal: Negative.   Endocrine: Negative.   Genitourinary: Negative.   Musculoskeletal:  Positive for arthralgias.  Skin: Negative.   Allergic/Immunologic: Negative.   Neurological:  Positive for dizziness.       Some weakness in grip in the right hand, particular the 4th and 5th fingers since her stroke.  Hematological: Negative.   Psychiatric/Behavioral: Negative.       BP (!) 154/51   Pulse (!) 58   Resp 18   Ht 5' 2 (1.575 m)   Wt 145 lb (65.8 kg)   SpO2 95%   BMI 26.52 kg/m  Physical Exam Constitutional:      Appearance: Normal appearance. She is normal weight.  HENT:     Head: Normocephalic and atraumatic.  Eyes:     Extraocular Movements: Extraocular movements intact.     Conjunctiva/sclera: Conjunctivae normal.     Pupils: Pupils are equal, round, and reactive to light.  Neck:     Vascular: No carotid bruit.  Cardiovascular:     Rate and Rhythm: Normal rate and regular rhythm.     Pulses: Normal pulses.     Heart sounds: Murmur heard.     Comments: 2/6 systolic murmur, 3/6 diastolic murmur right sternal border Pulmonary:     Effort: Pulmonary effort is normal.     Breath sounds: Normal breath sounds.  Abdominal:     General: There is no distension.     Tenderness: There is no abdominal tenderness.  Musculoskeletal:        General: No swelling.   Skin:    General: Skin is warm and dry.  Neurological:     General: No focal deficit present.     Mental Status: She is alert and oriented to person, place, and time.  Psychiatric:        Mood and Affect: Mood normal.        Behavior: Behavior normal.         Diagnostic Tests:   TRANSESOPHOGEAL ECHO REPORT       Patient Name:   HALIMAH BEWICK [facility] Date of Exam: 12/25/2023  Medical Rec #:  [id number]             Height:       62.0 in  Accession #:    [id number]            Weight:       145.0 lb  Date of Birth:  10-24-1950             BSA:  1.667 m  Patient Age:    72 years              BP:           120/55 mmHg  Patient Gender: F                     HR:           69 bpm.  Exam Location:  Inpatient   Procedure: Transesophageal Echo, 3D Echo, Cardiac Doppler and Color  Doppler            (Both Spectral and Color Flow Doppler were utilized during             procedure).   Indications:     Mitral Abnormality    History:         Patient has prior history of Echocardiogram examinations,  most                  recent 12/07/2023.    Sonographer:     Jayson Gaskins  Referring Phys:  31 BRIAN S CRENSHAW  Diagnosing Phys: Redell Shallow MD   PROCEDURE: After discussion of the risks and benefits of a TEE, an  informed consent was obtained from the patient. The transesophogeal probe  was passed without difficulty through the esophogus of the patient.  Sedation performed by different physician.  The patient was monitored while under deep sedation. Anesthestetic  sedation was provided intravenously by Anesthesiology: 200mg  of Propofol ,  100mg  of Lidocaine . The patient developed no complications during the  procedure.    IMPRESSIONS     1. AI is central with eccentric course; likely due to dilated aortic  root.   2. Left ventricular ejection fraction, by estimation, is 45 to 50%. The  left ventricle has mildly decreased function. The left ventricle  demonstrates global  hypokinesis. The left ventricular internal cavity size  was moderately dilated.   3. Right ventricular systolic function is normal. The right ventricular  size is normal.   4. Left atrial size was severely dilated. No left atrial/left atrial  appendage thrombus was detected.   5. 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 There is moderate holosystolic prolapse of the middle  segment of the anterior leaflet of the mitral valve.   6. The aortic valve is tricuspid. Aortic valve regurgitation is severe.  Aortic valve sclerosis is present, with no evidence of aortic valve  stenosis.   7. Aortic dilatation noted. There is moderate dilatation of the aortic  root, measuring 47 mm. There is Moderate (Grade III) plaque involving the  descending aorta.   8. 3D performed of the mitral valve and 3D performed of the aortic valve.   FINDINGS   Left Ventricle: Left ventricular ejection fraction, by estimation, is 45  to 50%. The left ventricle has mildly decreased function. The left  ventricle demonstrates global hypokinesis. The left ventricular internal  cavity size was moderately dilated.   Right Ventricle: The right ventricular size is normal. Right ventricular  systolic function is normal.   Left Atrium: Left atrial size was severely dilated. No left atrial/left  atrial appendage thrombus was detected.   Right Atrium: Right atrial size was normal in size.   Pericardium: There is no evidence of pericardial effusion.   Mitral Valve: The mitral valve is myxomatous. There is moderate  holosystolic prolapse of the middle segment of the anterior leaflet of the  mitral valve. Mild  to moderate mitral valve regurgitation.   Tricuspid Valve: The tricuspid valve is normal in structure. Tricuspid  valve regurgitation is trivial.   Aortic Valve: The aortic valve is tricuspid. Aortic valve regurgitation is  severe. Aortic valve sclerosis is present, with no  evidence of aortic  valve stenosis.   Pulmonic Valve: The pulmonic valve was grossly normal. Pulmonic valve  regurgitation is not visualized.   Aorta: Aortic dilatation noted. There is moderate dilatation of the aortic  root, measuring 47 mm. There is moderate (Grade III) plaque involving the  descending aorta.   IAS/Shunts: No atrial level shunt detected by color flow Doppler.   Additional Comments: AI is central with eccentric course; likely due to  dilated aortic root. 3D was performed not requiring image post processing  on an independent workstation and was abnormal.   Redell Shallow MD  Electronically signed by Redell Shallow MD  Signature Date/Time: 12/25/2023/9:40:08 AM        Final      hysicians   Panel Physicians Referring Physician Case Authorizing Physician  Elmira Newman PARAS, MD (Primary)        Procedures   RIGHT/LEFT HEART CATH AND CORONARY ANGIOGRAPHY    Conclusion   Coronary angiography 12/25/2023: LM: Normal LAD: Prox 30% , distal 40% disease Ramus: Small, no significant disease Lcx: No significant disease RCA: Large, dominant          RPLA focal 40% disease   LVEDP 30 mmHg   Right heart catheterization 12/25/2023: RA: 7 mmHg RV: 27/3 mmHg PA: 30/15 mmHg, mPAP 16 mmHg PCW: 12 mmHg   AO sats: 97% PA sats: 69%   CO: 3.9 L/min CI: 2.3 L/min/m2   Mild nonobstructive coronary artery disease Elevated LVEDP in the setting of severe aortic regurgitation and reduced LVEF Refer to cardiothoracic surgery for consideration for aortic valve and possible aortic root replacement   Manish PARAS Elmira, MD           Recommendations   Antiplatelet/Anticoag No indication for antiplatelet therapy at this time .    Indications   Nonrheumatic aortic valve insufficiency [I35.1 (ICD-10-CM)]    Clinical Presentation   CHF/Shock Congestive heart failure present. NYHA Class II. No shock present.    Procedural Details   Technical Details  Procedures: 1. Selective left and right coronary angiography 2. Left heart catheterization 3. Conscious sedation monitoring 6 min  Indication: Aortic regurgitation  History: 73 y.o. female with  hypertension, hyperlipidemia, remote h/o stroke, s/p splenectomy for hereditary spherocytosis, mild to moderate mitral regurgitation, moderate to severe aortic valve regurgitation, exertional dyspnea, LV dysfunction   Diagnostic Angiography: Catheter/s advances over guidewire under fluoroscopy Left coronary artery: 5 Fr JL 4  Right coronary artery: 5 Fr JR 4 Left heart catheterization: 5 Fr JR 4   Anticoagulation:  3500 units heparin   Total contrast used: 25 cc   Hemostasis: TR band  Total fluoro time: 2.9 min Air Kerma: 63 mGy  During this procedure the patient is administered a total of Versed  1 mg and Fentanyl  25 mcg to achieve and maintain moderate conscious sedation.  The patient's heart rate, blood pressure, and oxygen saturation are monitored continuously during the procedure. The period of conscious sedation is 6 minutes, of which I was present face-to-face 100% of this time. Allena Blackwater, RT is an independent, trained observer who assisted in the monitoring of the patient's level of consciousness.   Total sedation time: 6 minutes.  All wires and catheters removed out of the body  at the end of the procedure Final angiogram showed no dissection/perforation  Complications: None        Estimated blood loss <50 mL.   During this procedure medications were administered to achieve and maintain moderate conscious sedation while the patient's heart rate, blood pressure, and oxygen saturation were continuously monitored and I was present face-to-face 100% of this time. Charlene Haire Rad Tech and [employer] Cardiovascular Specialist are independent, trained observers who assisted in the monitoring of the patient's level of consciousness.    Medications (Filter:  Administrations occurring from 1019 to 1108 on 12/25/23) lidocaine  (PF) (XYLOCAINE ) 1 % injection (mL)  Total volume: 4 mL Date/Time Rate/Dose/Volume Action    12/25/23 1036 2 mL Given    1038 2 mL Given    Radial Cocktail/Verapamil  only (mL)  Total volume: 10 mL Date/Time Rate/Dose/Volume Action    12/25/23 1040 10 mL Given    midazolam  (VERSED ) injection (mg)  Total dose: 1 mg Date/Time Rate/Dose/Volume Action    12/25/23 1048 1 mg Given    fentaNYL  (SUBLIMAZE ) injection (mcg)  Total dose: 25 mcg Date/Time Rate/Dose/Volume Action    12/25/23 1048 25 mcg Given    heparin  sodium (porcine) injection (Units)  Total dose: 3,500 Units Date/Time Rate/Dose/Volume Action    12/25/23 1050 3,500 Units Given    iohexol  (OMNIPAQUE ) 350 MG/ML injection (mL)  Total volume: 25 mL Date/Time Rate/Dose/Volume Action    12/25/23 1100 25 mL Given    Heparin  (Porcine) in NaCl 2000-0.9 UNIT/L-% SOLN (mL)  Total volume: 1,000 mL Date/Time Rate/Dose/Volume Action    12/25/23 1050 1,000 mL Given      Sedation Time   Sedation Time Physician-1: 6 minutes 37 seconds Contrast        Administrations occurring from 1019 to 1108 on 12/25/23:  Medication Name Total Dose  iohexol  (OMNIPAQUE ) 350 MG/ML injection 25 mL    Radiation/Fluoro   Fluoro time: 2.9 (min) DAP: 4.1 (Gycm2) Cumulative Air Kerma: 63.4 (mGy) Complications   Complications documented before study signed (12/25/2023 11:09 AM)    No complications were associated with this study.  Documented by Sharran Frieze - 12/25/2023 11:01 AM      Coronary Findings   Diagnostic Dominance: Right Left Anterior Descending  Prox LAD lesion is 30% stenosed. The lesion is mildly calcified.  Dist LAD lesion is 40% stenosed.    Ramus Intermedius  Vessel is small.    Right Coronary Artery    Right Posterior Atrioventricular Artery  RPAV lesion is 40% stenosed.    Intervention    No interventions have been documented.    Right Heart    Right Heart Pressures Right heart catheterization 12/25/2023: RA: 7 mmHg RV: 27/3 mmHg PA: 30/15 mmHg, mPAP 16 mmHg PCW: 12 mmHg  AO sats: 97% PA sats: 69%  CO: 3.9 L/min CI: 2.3 L/min/m2    Left Heart   Left Ventricle LV end diastolic pressure is moderately elevated.    Coronary Diagrams   Diagnostic Dominance: Right  Intervention    Implants    No implant documentation for this case.    Syngo Images    Show images for CARDIAC CATHETERIZATION Images on Long Term Storage    Show images for Kobus, Shian Kirkman Link to Procedure Log   Procedure Log    Hemo Data   Flowsheet Row Most Recent Value  Fick Cardiac Output 3.97 L/min  Fick Cardiac Output Index 2.38 (L/min)/BSA  Aortic Mean Gradient 9.31 mmHg  Aortic Peak Gradient 4.1 mmHg  Aortic Valve Area 1.36  Aortic Value Area Index 0.81 cm2/BSA  RA A Wave 9 mmHg  RA V Wave 7 mmHg  RA Mean 7 mmHg  RV Systolic Pressure 27 mmHg  RV Diastolic Pressure 3 mmHg  RV EDP 9 mmHg  PA Systolic Pressure 30 mmHg  PA Diastolic Pressure 15 mmHg  PA Mean 16 mmHg  PW A Wave 19 mmHg  PW V Wave 14 mmHg  PW Mean 12 mmHg  AO Systolic Pressure 161 mmHg  AO Diastolic Pressure 112 mmHg  AO Mean 100 mmHg  LV Systolic Pressure 163 mmHg  LV Diastolic Pressure 61 mmHg  LV EDP 32 mmHg  Arterial Occlusion Pressure Extended Systolic Pressure 163 mmHg  Arterial Occlusion Pressure Extended Diastolic Pressure 61 mmHg  Arterial Occlusion Pressure Extended Mean Pressure 99 mmHg  Left Ventricular Apex Extended Systolic Pressure 164 mmHg  LVp Diastolic Pressure 11 mmHg  Left Ventricular Apex Extended EDP Pressure 29 mmHg  QP/QS 1  TPVR Index 6.73 HRUI  TSVR Index 42.09 HRUI  PVR SVR Ratio 0.04  TPVR/TSVR Ratio 0.16      Narrative & Impression  CLINICAL DATA:  Aortic disease   EXAM: CT ANGIOGRAPHY CHEST WITH CONTRAST   TECHNIQUE: Multidetector CT imaging of the chest was performed using the standard protocol during bolus  administration of intravenous contrast. Multiplanar CT image reconstructions and MIPs were obtained to evaluate the vascular anatomy.   RADIATION DOSE REDUCTION: This exam was performed according to the departmental dose-optimization program which includes automated exposure control, adjustment of the mA and/or kV according to patient size and/or use of iterative reconstruction technique.   CONTRAST:  OMNIPAQUE  IOHEXOL  350 MG/ML SOLN   COMPARISON:  None Available.   FINDINGS: Cardiovascular: Aneurysmal dilatation of the aortic root measuring 5.2 cm at the sinuses of Valsalva. No effacement of the sino-tubular junction. The tubular portion of the ascending thoracic aorta is ectatic but not aneurysmal at 3.8 cm. The transverse and descending thoracic aorta are normal in caliber. Four vessel arch anatomy. The left vertebral artery arises directly from the aorta. Scattered atherosclerotic plaque throughout the aorta and coronary arteries. Mild cardiomegaly with left atrial enlargement and mild left ventricular dilatation. No pericardial effusion.   Mediastinum/Nodes: Unremarkable CT appearance of the thyroid  gland. No suspicious mediastinal or hilar adenopathy. No soft tissue mediastinal mass. The thoracic esophagus is unremarkable.   Lungs/Pleura: No suspicious pulmonary mass or nodule. Mild dependent atelectasis.   Upper Abdomen: No acute abnormality within the upper abdomen. 3-4 mm nonobstructing stone in the lower pole collecting system of the left kidney.   Musculoskeletal: No acute osseous abnormality. Extensive multilevel degenerative disc disease.   Review of the MIP images confirms the above findings.   IMPRESSION: 1. 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 2. No effacement of the sino-tubular junction and mildly ectatic but nonaneurysmal ascending aorta with normal transverse and descending thoracic aorta  diameters. 3. Aortic and coronary artery atherosclerotic vascular calcifications. 4. Four vessel arch anatomy. The left vertebral artery arises directly from the aorta. 5. Cardiomegaly with prominent left atrial enlargement and mild left ventricular dilatation. 6. Nonobstructing left lower pole nephrolithiasis. 7. Multilevel degenerative disc disease.   Aortic Atherosclerosis (ICD10-I70.0); Aortic aneurysm NOS (ICD10-I71.9).     Electronically Signed   By: Wilkie Lent M.D.   On: 01/06/2024 16:06        Impression:   This 73 year old woman has stage D, severe, symptomatic  aortic insufficiency with NYHA class II symptoms of exertional fatigue and shortness of breath.  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  She has mildly decreased left ventricular systolic function with an ejection fraction of 45 to 50% with global hypokinesis and a moderately dilated left ventricular cavity.  I agree that replacement of her aortic valve and aortic root aneurysm is indicated for relief of her symptoms and to prevent progressive left ventricular dysfunction and aortic dissection.  Given her age I would recommend replacement using a bioprosthetic valved conduit with reimplantation of her coronary arteries.  She has mild nonobstructive coronary disease and mild to moderate mitral regurgitation due to myxomatous mitral valve with anterior leaflet prolapse.  I do not think these require intervention.  I reviewed the CT and echo images with the patient and her husband and answered their questions. I discussed the operative procedure with the patient and her husband including alternatives, benefits and risks; including but not limited to bleeding, blood transfusion, infection, stroke, myocardial infarction, heart block requiring a permanent pacemaker, organ dysfunction, and death.  Carley Kirkman Haft understands and agrees to proceed.       Plan:   Bentall procedure using a bioprosthetic valve conduit.      Dorise MARLA Fellers, MD [facility] [phone]

## 2024-02-08 ENCOUNTER — Inpatient Hospital Stay (HOSPITAL_COMMUNITY)

## 2024-02-08 ENCOUNTER — Inpatient Hospital Stay (HOSPITAL_COMMUNITY): Payer: Self-pay | Admitting: Certified Registered Nurse Anesthetist

## 2024-02-08 ENCOUNTER — Encounter (HOSPITAL_COMMUNITY): Payer: Self-pay | Admitting: Surgery

## 2024-02-08 ENCOUNTER — Inpatient Hospital Stay (HOSPITAL_COMMUNITY)
Admission: RE | Disposition: A | Discharge status: Home / Self Care | Payer: Self-pay | Source: Home / Self Care | Attending: Surgery

## 2024-02-08 ENCOUNTER — Inpatient Hospital Stay (HOSPITAL_COMMUNITY)
Admission: RE | Admit: 2024-02-08 | Discharge: 2024-02-15 | DRG: 220 | Disposition: A | Discharge status: Home Health | Attending: Surgery | Admitting: Surgery

## 2024-02-08 ENCOUNTER — Other Ambulatory Visit: Payer: Self-pay

## 2024-02-08 ENCOUNTER — Other Ambulatory Visit: Payer: Self-pay | Admitting: Cardiology

## 2024-02-08 DIAGNOSIS — Z87891 Personal history of nicotine dependence: Secondary | ICD-10-CM | POA: Diagnosis not present

## 2024-02-08 DIAGNOSIS — G4733 Obstructive sleep apnea (adult) (pediatric): Secondary | ICD-10-CM | POA: Diagnosis present

## 2024-02-08 DIAGNOSIS — E876 Hypokalemia: Secondary | ICD-10-CM | POA: Diagnosis not present

## 2024-02-08 DIAGNOSIS — D6959 Other secondary thrombocytopenia: Secondary | ICD-10-CM | POA: Diagnosis not present

## 2024-02-08 DIAGNOSIS — Z952 Presence of prosthetic heart valve: Principal | ICD-10-CM

## 2024-02-08 DIAGNOSIS — I5022 Chronic systolic (congestive) heart failure: Secondary | ICD-10-CM | POA: Diagnosis not present

## 2024-02-08 DIAGNOSIS — I351 Nonrheumatic aortic (valve) insufficiency: Secondary | ICD-10-CM | POA: Diagnosis not present

## 2024-02-08 DIAGNOSIS — Z7983 Long term (current) use of bisphosphonates: Secondary | ICD-10-CM

## 2024-02-08 DIAGNOSIS — Z823 Family history of stroke: Secondary | ICD-10-CM

## 2024-02-08 DIAGNOSIS — Z833 Family history of diabetes mellitus: Secondary | ICD-10-CM

## 2024-02-08 DIAGNOSIS — E785 Hyperlipidemia, unspecified: Secondary | ICD-10-CM | POA: Diagnosis present

## 2024-02-08 DIAGNOSIS — I493 Ventricular premature depolarization: Secondary | ICD-10-CM | POA: Diagnosis not present

## 2024-02-08 DIAGNOSIS — K5900 Constipation, unspecified: Secondary | ICD-10-CM | POA: Diagnosis not present

## 2024-02-08 DIAGNOSIS — Z9081 Acquired absence of spleen: Secondary | ICD-10-CM

## 2024-02-08 DIAGNOSIS — I7121 Aneurysm of the ascending aorta, without rupture: Secondary | ICD-10-CM | POA: Diagnosis not present

## 2024-02-08 DIAGNOSIS — I517 Cardiomegaly: Secondary | ICD-10-CM | POA: Diagnosis not present

## 2024-02-08 DIAGNOSIS — I340 Nonrheumatic mitral (valve) insufficiency: Secondary | ICD-10-CM | POA: Diagnosis present

## 2024-02-08 DIAGNOSIS — H9190 Unspecified hearing loss, unspecified ear: Secondary | ICD-10-CM | POA: Diagnosis present

## 2024-02-08 DIAGNOSIS — K219 Gastro-esophageal reflux disease without esophagitis: Secondary | ICD-10-CM | POA: Diagnosis present

## 2024-02-08 DIAGNOSIS — F17210 Nicotine dependence, cigarettes, uncomplicated: Secondary | ICD-10-CM | POA: Diagnosis present

## 2024-02-08 DIAGNOSIS — Y92239 Unspecified place in hospital as the place of occurrence of the external cause: Secondary | ICD-10-CM | POA: Diagnosis not present

## 2024-02-08 DIAGNOSIS — E871 Hypo-osmolality and hyponatremia: Secondary | ICD-10-CM | POA: Diagnosis not present

## 2024-02-08 DIAGNOSIS — Z452 Encounter for adjustment and management of vascular access device: Secondary | ICD-10-CM | POA: Diagnosis not present

## 2024-02-08 DIAGNOSIS — I480 Paroxysmal atrial fibrillation: Secondary | ICD-10-CM | POA: Diagnosis present

## 2024-02-08 DIAGNOSIS — I110 Hypertensive heart disease with heart failure: Secondary | ICD-10-CM | POA: Diagnosis not present

## 2024-02-08 DIAGNOSIS — Q2543 Congenital aneurysm of aorta: Secondary | ICD-10-CM

## 2024-02-08 DIAGNOSIS — M1990 Unspecified osteoarthritis, unspecified site: Secondary | ICD-10-CM | POA: Diagnosis present

## 2024-02-08 DIAGNOSIS — Z96653 Presence of artificial knee joint, bilateral: Secondary | ICD-10-CM | POA: Diagnosis present

## 2024-02-08 DIAGNOSIS — Z85828 Personal history of other malignant neoplasm of skin: Secondary | ICD-10-CM | POA: Diagnosis not present

## 2024-02-08 DIAGNOSIS — Z96611 Presence of right artificial shoulder joint: Secondary | ICD-10-CM | POA: Diagnosis not present

## 2024-02-08 DIAGNOSIS — J90 Pleural effusion, not elsewhere classified: Secondary | ICD-10-CM | POA: Diagnosis not present

## 2024-02-08 DIAGNOSIS — Z4682 Encounter for fitting and adjustment of non-vascular catheter: Secondary | ICD-10-CM | POA: Diagnosis not present

## 2024-02-08 DIAGNOSIS — R918 Other nonspecific abnormal finding of lung field: Secondary | ICD-10-CM | POA: Diagnosis not present

## 2024-02-08 DIAGNOSIS — D72829 Elevated white blood cell count, unspecified: Secondary | ICD-10-CM | POA: Diagnosis not present

## 2024-02-08 DIAGNOSIS — J9811 Atelectasis: Secondary | ICD-10-CM | POA: Diagnosis not present

## 2024-02-08 DIAGNOSIS — Z87442 Personal history of urinary calculi: Secondary | ICD-10-CM

## 2024-02-08 DIAGNOSIS — M549 Dorsalgia, unspecified: Secondary | ICD-10-CM | POA: Diagnosis not present

## 2024-02-08 DIAGNOSIS — Z8711 Personal history of peptic ulcer disease: Secondary | ICD-10-CM

## 2024-02-08 DIAGNOSIS — I10 Essential (primary) hypertension: Secondary | ICD-10-CM

## 2024-02-08 DIAGNOSIS — Z818 Family history of other mental and behavioral disorders: Secondary | ICD-10-CM

## 2024-02-08 DIAGNOSIS — Z8673 Personal history of transient ischemic attack (TIA), and cerebral infarction without residual deficits: Secondary | ICD-10-CM

## 2024-02-08 DIAGNOSIS — Z8249 Family history of ischemic heart disease and other diseases of the circulatory system: Secondary | ICD-10-CM

## 2024-02-08 DIAGNOSIS — I712 Thoracic aortic aneurysm, without rupture, unspecified: Secondary | ICD-10-CM | POA: Diagnosis not present

## 2024-02-08 DIAGNOSIS — I358 Other nonrheumatic aortic valve disorders: Secondary | ICD-10-CM | POA: Diagnosis not present

## 2024-02-08 DIAGNOSIS — T502X5A Adverse effect of carbonic-anhydrase inhibitors, benzothiadiazides and other diuretics, initial encounter: Secondary | ICD-10-CM | POA: Diagnosis not present

## 2024-02-08 DIAGNOSIS — M542 Cervicalgia: Secondary | ICD-10-CM | POA: Diagnosis not present

## 2024-02-08 DIAGNOSIS — I341 Nonrheumatic mitral (valve) prolapse: Secondary | ICD-10-CM | POA: Diagnosis present

## 2024-02-08 DIAGNOSIS — R0989 Other specified symptoms and signs involving the circulatory and respiratory systems: Secondary | ICD-10-CM | POA: Diagnosis not present

## 2024-02-08 DIAGNOSIS — Z7982 Long term (current) use of aspirin: Secondary | ICD-10-CM

## 2024-02-08 DIAGNOSIS — R110 Nausea: Secondary | ICD-10-CM | POA: Diagnosis not present

## 2024-02-08 DIAGNOSIS — Z48812 Encounter for surgical aftercare following surgery on the circulatory system: Secondary | ICD-10-CM | POA: Diagnosis not present

## 2024-02-08 DIAGNOSIS — J984 Other disorders of lung: Secondary | ICD-10-CM | POA: Diagnosis not present

## 2024-02-08 DIAGNOSIS — Z79899 Other long term (current) drug therapy: Secondary | ICD-10-CM | POA: Diagnosis not present

## 2024-02-08 DIAGNOSIS — I5020 Unspecified systolic (congestive) heart failure: Secondary | ICD-10-CM | POA: Diagnosis not present

## 2024-02-08 HISTORY — PX: INTRAOPERATIVE TRANSESOPHAGEAL ECHOCARDIOGRAM: SHX5062

## 2024-02-08 HISTORY — PX: BENTALL PROCEDURE: SHX5058

## 2024-02-08 LAB — POCT I-STAT, CHEM 8
BUN: 21 mg/dL (ref 8–23)
BUN: 16 mg/dL (ref 8–23)
BUN: 17 mg/dL (ref 8–23)
BUN: 18 mg/dL (ref 8–23)
BUN: 18 mg/dL (ref 8–23)
BUN: 19 mg/dL (ref 8–23)
BUN: 16 mg/dL (ref 8–23)
Calcium, Ion: 1.26 mmol/L (ref 1.15–1.40)
Calcium, Ion: 1.12 mmol/L — ABNORMAL LOW (ref 1.15–1.40)
Calcium, Ion: 1.17 mmol/L (ref 1.15–1.40)
Calcium, Ion: 1.11 mmol/L — ABNORMAL LOW (ref 1.15–1.40)
Calcium, Ion: 1.11 mmol/L — ABNORMAL LOW (ref 1.15–1.40)
Calcium, Ion: 1.24 mmol/L (ref 1.15–1.40)
Calcium, Ion: 1.2 mmol/L (ref 1.15–1.40)
Chloride: 102 mmol/L (ref 98–111)
Chloride: 104 mmol/L (ref 98–111)
Chloride: 107 mmol/L (ref 98–111)
Chloride: 102 mmol/L (ref 98–111)
Chloride: 104 mmol/L (ref 98–111)
Chloride: 106 mmol/L (ref 98–111)
Chloride: 105 mmol/L (ref 98–111)
Creatinine, Ser: 0.5 mg/dL (ref 0.44–1.00)
Creatinine, Ser: 0.4 mg/dL — ABNORMAL LOW (ref 0.44–1.00)
Creatinine, Ser: 0.5 mg/dL (ref 0.44–1.00)
Creatinine, Ser: 0.3 mg/dL — ABNORMAL LOW (ref 0.44–1.00)
Creatinine, Ser: 0.4 mg/dL — ABNORMAL LOW (ref 0.44–1.00)
Creatinine, Ser: 0.4 mg/dL — ABNORMAL LOW (ref 0.44–1.00)
Creatinine, Ser: 0.4 mg/dL — ABNORMAL LOW (ref 0.44–1.00)
Glucose, Bld: 93 mg/dL (ref 70–99)
Glucose, Bld: 103 mg/dL — ABNORMAL HIGH (ref 70–99)
Glucose, Bld: 110 mg/dL — ABNORMAL HIGH (ref 70–99)
Glucose, Bld: 126 mg/dL — ABNORMAL HIGH (ref 70–99)
Glucose, Bld: 131 mg/dL — ABNORMAL HIGH (ref 70–99)
Glucose, Bld: 139 mg/dL — ABNORMAL HIGH (ref 70–99)
Glucose, Bld: 143 mg/dL — ABNORMAL HIGH (ref 70–99)
HCT: 30 % — ABNORMAL LOW (ref 36.0–46.0)
HCT: 21 % — ABNORMAL LOW (ref 36.0–46.0)
HCT: 24 % — ABNORMAL LOW (ref 36.0–46.0)
HCT: 23 % — ABNORMAL LOW (ref 36.0–46.0)
HCT: 23 % — ABNORMAL LOW (ref 36.0–46.0)
HCT: 21 % — ABNORMAL LOW (ref 36.0–46.0)
HCT: 20 % — ABNORMAL LOW (ref 36.0–46.0)
Hemoglobin: 10.2 g/dL — ABNORMAL LOW (ref 12.0–15.0)
Hemoglobin: 7.1 g/dL — ABNORMAL LOW (ref 12.0–15.0)
Hemoglobin: 8.2 g/dL — ABNORMAL LOW (ref 12.0–15.0)
Hemoglobin: 7.8 g/dL — ABNORMAL LOW (ref 12.0–15.0)
Hemoglobin: 7.8 g/dL — ABNORMAL LOW (ref 12.0–15.0)
Hemoglobin: 7.1 g/dL — ABNORMAL LOW (ref 12.0–15.0)
Hemoglobin: 6.8 g/dL — CL (ref 12.0–15.0)
Potassium: 3.7 mmol/L (ref 3.5–5.1)
Potassium: 3.3 mmol/L — ABNORMAL LOW (ref 3.5–5.1)
Potassium: 4.4 mmol/L (ref 3.5–5.1)
Potassium: 4.3 mmol/L (ref 3.5–5.1)
Potassium: 4.3 mmol/L (ref 3.5–5.1)
Potassium: 4.6 mmol/L (ref 3.5–5.1)
Potassium: 3.7 mmol/L (ref 3.5–5.1)
Sodium: 142 mmol/L (ref 135–145)
Sodium: 137 mmol/L (ref 135–145)
Sodium: 141 mmol/L (ref 135–145)
Sodium: 138 mmol/L (ref 135–145)
Sodium: 138 mmol/L (ref 135–145)
Sodium: 139 mmol/L (ref 135–145)
Sodium: 140 mmol/L (ref 135–145)
TCO2: 22 mmol/L (ref 22–32)
TCO2: 22 mmol/L (ref 22–32)
TCO2: 27 mmol/L (ref 22–32)
TCO2: 26 mmol/L (ref 22–32)
TCO2: 26 mmol/L (ref 22–32)
TCO2: 27 mmol/L (ref 22–32)
TCO2: 26 mmol/L (ref 22–32)

## 2024-02-08 LAB — CBC
HCT: 32.3 % — ABNORMAL LOW (ref 36.0–46.0)
HCT: 31.1 % — ABNORMAL LOW (ref 36.0–46.0)
Hemoglobin: 11.2 g/dL — ABNORMAL LOW (ref 12.0–15.0)
Hemoglobin: 11 g/dL — ABNORMAL LOW (ref 12.0–15.0)
MCH: 32.1 pg (ref 26.0–34.0)
MCH: 32.2 pg (ref 26.0–34.0)
MCHC: 34.7 g/dL (ref 30.0–36.0)
MCHC: 35.4 g/dL (ref 30.0–36.0)
MCV: 92.6 fL (ref 80.0–100.0)
MCV: 90.9 fL (ref 80.0–100.0)
Platelets: 57 K/uL — ABNORMAL LOW (ref 150–400)
Platelets: 72 K/uL — ABNORMAL LOW (ref 150–400)
RBC: 3.49 MIL/uL — ABNORMAL LOW (ref 3.87–5.11)
RBC: 3.42 MIL/uL — ABNORMAL LOW (ref 3.87–5.11)
RDW: 14 % (ref 11.5–15.5)
RDW: 14.6 % (ref 11.5–15.5)
WBC: 15.4 K/uL — ABNORMAL HIGH (ref 4.0–10.5)
WBC: 14.2 K/uL — ABNORMAL HIGH (ref 4.0–10.5)
nRBC: 0.1 % (ref 0.0–0.2)
nRBC: 0 % (ref 0.0–0.2)

## 2024-02-08 LAB — POCT I-STAT 7, (LYTES, BLD GAS, ICA,H+H)
Acid-Base Excess: 0 mmol/L (ref 0.0–2.0)
Acid-base deficit: 2 mmol/L (ref 0.0–2.0)
Acid-base deficit: 2 mmol/L (ref 0.0–2.0)
Acid-base deficit: 4 mmol/L — ABNORMAL HIGH (ref 0.0–2.0)
Acid-base deficit: 2 mmol/L (ref 0.0–2.0)
Acid-base deficit: 3 mmol/L — ABNORMAL HIGH (ref 0.0–2.0)
Bicarbonate: 21.4 mmol/L (ref 20.0–28.0)
Bicarbonate: 23.3 mmol/L (ref 20.0–28.0)
Bicarbonate: 22.3 mmol/L (ref 20.0–28.0)
Bicarbonate: 21.6 mmol/L (ref 20.0–28.0)
Bicarbonate: 22.8 mmol/L (ref 20.0–28.0)
Bicarbonate: 21.4 mmol/L (ref 20.0–28.0)
Calcium, Ion: 0.92 mmol/L — ABNORMAL LOW (ref 1.15–1.40)
Calcium, Ion: 1.26 mmol/L (ref 1.15–1.40)
Calcium, Ion: 1.23 mmol/L (ref 1.15–1.40)
Calcium, Ion: 1.21 mmol/L (ref 1.15–1.40)
Calcium, Ion: 1.18 mmol/L (ref 1.15–1.40)
Calcium, Ion: 1.17 mmol/L (ref 1.15–1.40)
HCT: 21 % — ABNORMAL LOW (ref 36.0–46.0)
HCT: 20 % — ABNORMAL LOW (ref 36.0–46.0)
HCT: 20 % — ABNORMAL LOW (ref 36.0–46.0)
HCT: 28 % — ABNORMAL LOW (ref 36.0–46.0)
HCT: 28 % — ABNORMAL LOW (ref 36.0–46.0)
HCT: 26 % — ABNORMAL LOW (ref 36.0–46.0)
Hemoglobin: 7.1 g/dL — ABNORMAL LOW (ref 12.0–15.0)
Hemoglobin: 6.8 g/dL — CL (ref 12.0–15.0)
Hemoglobin: 6.8 g/dL — CL (ref 12.0–15.0)
Hemoglobin: 9.5 g/dL — ABNORMAL LOW (ref 12.0–15.0)
Hemoglobin: 9.5 g/dL — ABNORMAL LOW (ref 12.0–15.0)
Hemoglobin: 8.8 g/dL — ABNORMAL LOW (ref 12.0–15.0)
O2 Saturation: 100 %
O2 Saturation: 100 %
O2 Saturation: 100 %
O2 Saturation: 97 %
O2 Saturation: 97 %
O2 Saturation: 97 %
Patient temperature: 35
Patient temperature: 37.8
Patient temperature: 37.5
Potassium: 4.2 mmol/L (ref 3.5–5.1)
Potassium: 4.6 mmol/L (ref 3.5–5.1)
Potassium: 3.7 mmol/L (ref 3.5–5.1)
Potassium: 4.2 mmol/L (ref 3.5–5.1)
Potassium: 4.2 mmol/L (ref 3.5–5.1)
Potassium: 4.1 mmol/L (ref 3.5–5.1)
Sodium: 140 mmol/L (ref 135–145)
Sodium: 140 mmol/L (ref 135–145)
Sodium: 141 mmol/L (ref 135–145)
Sodium: 142 mmol/L (ref 135–145)
Sodium: 142 mmol/L (ref 135–145)
Sodium: 142 mmol/L (ref 135–145)
TCO2: 22 mmol/L (ref 22–32)
TCO2: 24 mmol/L (ref 22–32)
TCO2: 23 mmol/L (ref 22–32)
TCO2: 23 mmol/L (ref 22–32)
TCO2: 24 mmol/L (ref 22–32)
TCO2: 23 mmol/L (ref 22–32)
pCO2 arterial: 28.5 mmHg — ABNORMAL LOW (ref 32–48)
pCO2 arterial: 32.2 mmHg (ref 32–48)
pCO2 arterial: 33.3 mmHg (ref 32–48)
pCO2 arterial: 35.7 mmHg (ref 32–48)
pCO2 arterial: 40.3 mmHg (ref 32–48)
pCO2 arterial: 36.8 mmHg (ref 32–48)
pH, Arterial: 7.484 — ABNORMAL HIGH (ref 7.35–7.45)
pH, Arterial: 7.467 — ABNORMAL HIGH (ref 7.35–7.45)
pH, Arterial: 7.433 (ref 7.35–7.45)
pH, Arterial: 7.381 (ref 7.35–7.45)
pH, Arterial: 7.364 (ref 7.35–7.45)
pH, Arterial: 7.375 (ref 7.35–7.45)
pO2, Arterial: 399 mmHg — ABNORMAL HIGH (ref 83–108)
pO2, Arterial: 477 mmHg — ABNORMAL HIGH (ref 83–108)
pO2, Arterial: 404 mmHg — ABNORMAL HIGH (ref 83–108)
pO2, Arterial: 89 mmHg (ref 83–108)
pO2, Arterial: 101 mmHg (ref 83–108)
pO2, Arterial: 91 mmHg (ref 83–108)

## 2024-02-08 LAB — PROTIME-INR
INR: 1.9 — ABNORMAL HIGH (ref 0.8–1.2)
Prothrombin Time: 22.6 s — ABNORMAL HIGH (ref 11.4–15.2)

## 2024-02-08 LAB — GLUCOSE, CAPILLARY
Glucose-Capillary: 126 mg/dL — ABNORMAL HIGH (ref 70–99)
Glucose-Capillary: 132 mg/dL — ABNORMAL HIGH (ref 70–99)
Glucose-Capillary: 122 mg/dL — ABNORMAL HIGH (ref 70–99)
Glucose-Capillary: 117 mg/dL — ABNORMAL HIGH (ref 70–99)
Glucose-Capillary: 155 mg/dL — ABNORMAL HIGH (ref 70–99)
Glucose-Capillary: 163 mg/dL — ABNORMAL HIGH (ref 70–99)
Glucose-Capillary: 143 mg/dL — ABNORMAL HIGH (ref 70–99)
Glucose-Capillary: 157 mg/dL — ABNORMAL HIGH (ref 70–99)

## 2024-02-08 LAB — BASIC METABOLIC PANEL WITH GFR
Anion gap: 8 (ref 5–15)
BUN: 15 mg/dL (ref 8–23)
CO2: 21 mmol/L — ABNORMAL LOW (ref 22–32)
Calcium: 7.9 mg/dL — ABNORMAL LOW (ref 8.9–10.3)
Chloride: 111 mmol/L (ref 98–111)
Creatinine, Ser: 0.59 mg/dL (ref 0.44–1.00)
GFR, Estimated: >60 mL/min (ref >60)
Glucose, Bld: 169 mg/dL — ABNORMAL HIGH (ref 70–99)
Potassium: 4.2 mmol/L (ref 3.5–5.1)
Sodium: 140 mmol/L (ref 135–145)

## 2024-02-08 LAB — POCT I-STAT EG7
Acid-base deficit: 1 mmol/L (ref 0.0–2.0)
Bicarbonate: 23.3 mmol/L (ref 20.0–28.0)
Calcium, Ion: 1.03 mmol/L — ABNORMAL LOW (ref 1.15–1.40)
HCT: 20 % — ABNORMAL LOW (ref 36.0–46.0)
Hemoglobin: 6.8 g/dL — CL (ref 12.0–15.0)
O2 Saturation: 72 %
Potassium: 3.8 mmol/L (ref 3.5–5.1)
Sodium: 140 mmol/L (ref 135–145)
TCO2: 24 mmol/L (ref 22–32)
pCO2, Ven: 34.4 mmHg — ABNORMAL LOW (ref 44–60)
pH, Ven: 7.44 — ABNORMAL HIGH (ref 7.25–7.43)
pO2, Ven: 36 mmHg (ref 32–45)

## 2024-02-08 LAB — APTT: aPTT: 64 s — ABNORMAL HIGH (ref 24–36)

## 2024-02-08 LAB — ECHO INTRAOPERATIVE TEE
AV Vena cont: 0.62 cm
Height: 62 in
Weight: 2288 [oz_av]

## 2024-02-08 LAB — HEMOGLOBIN AND HEMATOCRIT, BLOOD
HCT: 25.6 % — ABNORMAL LOW (ref 36.0–46.0)
Hemoglobin: 9 g/dL — ABNORMAL LOW (ref 12.0–15.0)

## 2024-02-08 LAB — MAGNESIUM: Magnesium: 3.3 mg/dL — ABNORMAL HIGH (ref 1.7–2.4)

## 2024-02-08 LAB — PREPARE RBC (CROSSMATCH)

## 2024-02-08 LAB — PLATELET COUNT: Platelets: 102 K/uL — ABNORMAL LOW (ref 150–400)

## 2024-02-08 SURGERY — BENTALL PROCEDURE
Anesthesia: General | Site: Chest

## 2024-02-08 MED ORDER — METOPROLOL TARTRATE 5 MG/5ML IV SOLN: 2.5000 mg | INTRAVENOUS | Status: DC | PRN

## 2024-02-08 MED ORDER — SODIUM CHLORIDE 0.45 % IV SOLN: INTRAVENOUS | Status: AC | PRN

## 2024-02-08 MED ORDER — CHLORHEXIDINE GLUCONATE 4 % EX SOLN: 30.0000 mL | CUTANEOUS | Status: DC

## 2024-02-08 MED ORDER — THROMBIN 20000 UNITS EX SOLR: OROMUCOSAL | Status: DC | PRN

## 2024-02-08 MED ORDER — INSULIN REGULAR(HUMAN) IN NACL 100-0.9 UT/100ML-% IV SOLN: INTRAVENOUS | Status: DC

## 2024-02-08 MED ORDER — ROCURONIUM BROMIDE 10 MG/ML (PF) SYRINGE
PREFILLED_SYRINGE | INTRAVENOUS | Status: AC
  Filled 2024-02-08: qty 10

## 2024-02-08 MED ORDER — FENTANYL CITRATE (PF) 250 MCG/5ML IJ SOLN
INTRAMUSCULAR | Status: AC
  Filled 2024-02-08: qty 5

## 2024-02-08 MED ORDER — METOPROLOL TARTRATE 12.5 MG HALF TABLET
12.5000 mg | ORAL_TABLET | Freq: Once | ORAL | Status: DC
  Filled 2024-02-08: qty 1

## 2024-02-08 MED ORDER — MIDAZOLAM HCL 2 MG/2ML IJ SOLN: 2.0000 mg | INTRAMUSCULAR | Status: DC | PRN

## 2024-02-08 MED ORDER — LIDOCAINE 2% (20 MG/ML) 5 ML SYRINGE
INTRAMUSCULAR | Status: DC | PRN
  Administered 2024-02-08: 100 mg via INTRAVENOUS

## 2024-02-08 MED ORDER — GLYCOPYRROLATE PF 0.2 MG/ML IJ SOSY
PREFILLED_SYRINGE | INTRAMUSCULAR | Status: AC
  Filled 2024-02-08: qty 1

## 2024-02-08 MED ORDER — METOCLOPRAMIDE HCL 5 MG/ML IJ SOLN
10.0000 mg | Freq: Four times a day (QID) | INTRAMUSCULAR | Status: AC
  Administered 2024-02-08 – 2024-02-09 (×6): 10 mg via INTRAVENOUS
  Filled 2024-02-08 (×6): qty 2

## 2024-02-08 MED ORDER — ONDANSETRON HCL 4 MG/2ML IJ SOLN
4.0000 mg | Freq: Four times a day (QID) | INTRAMUSCULAR | Status: DC | PRN
  Administered 2024-02-09 – 2024-02-12 (×6): 4 mg via INTRAVENOUS
  Filled 2024-02-08 (×6): qty 2

## 2024-02-08 MED ORDER — PHENYLEPHRINE 80 MCG/ML (10ML) SYRINGE FOR IV PUSH (FOR BLOOD PRESSURE SUPPORT): PREFILLED_SYRINGE | INTRAVENOUS | Status: DC | PRN

## 2024-02-08 MED ORDER — ROCURONIUM BROMIDE 10 MG/ML (PF) SYRINGE
PREFILLED_SYRINGE | INTRAVENOUS | Status: DC | PRN
  Administered 2024-02-08: 30 mg via INTRAVENOUS
  Administered 2024-02-08: 100 mg via INTRAVENOUS

## 2024-02-08 MED ORDER — PHENYLEPHRINE HCL-NACL 20-0.9 MG/250ML-% IV SOLN: 0.0000 ug/min | INTRAVENOUS | Status: DC

## 2024-02-08 MED ORDER — OXYCODONE HCL 5 MG PO TABS
5.0000 mg | ORAL_TABLET | ORAL | Status: DC | PRN
  Administered 2024-02-08 – 2024-02-09 (×2): 5 mg via ORAL
  Filled 2024-02-08 (×2): qty 1

## 2024-02-08 MED ORDER — ACETAMINOPHEN 500 MG PO TABS
1000.0000 mg | ORAL_TABLET | Freq: Four times a day (QID) | ORAL | Status: AC
  Administered 2024-02-08 – 2024-02-13 (×19): 1000 mg via ORAL
  Filled 2024-02-08 (×20): qty 2

## 2024-02-08 MED ORDER — PROPOFOL 500 MG/50ML IV EMUL
INTRAVENOUS | Status: DC | PRN
  Administered 2024-02-08: 25 ug/kg/min via INTRAVENOUS

## 2024-02-08 MED ORDER — ACETAMINOPHEN 160 MG/5ML PO SOLN: 1000.0000 mg | Freq: Four times a day (QID) | ORAL | Status: AC

## 2024-02-08 MED ORDER — ASPIRIN 81 MG PO CHEW
324.0000 mg | CHEWABLE_TABLET | Freq: Once | ORAL | Status: AC
  Administered 2024-02-08: 324 mg
  Filled 2024-02-08: qty 4

## 2024-02-08 MED ORDER — DOCUSATE SODIUM 100 MG PO CAPS
200.0000 mg | ORAL_CAPSULE | Freq: Every day | ORAL | Status: DC
  Administered 2024-02-09 – 2024-02-12 (×3): 200 mg via ORAL
  Filled 2024-02-08 (×6): qty 2

## 2024-02-08 MED ORDER — BISACODYL 5 MG PO TBEC
10.0000 mg | DELAYED_RELEASE_TABLET | Freq: Every day | ORAL | Status: DC
  Administered 2024-02-09 – 2024-02-11 (×2): 10 mg via ORAL
  Filled 2024-02-08 (×3): qty 2

## 2024-02-08 MED ORDER — ASPIRIN 81 MG PO CHEW: 324.0000 mg | CHEWABLE_TABLET | Freq: Every day | ORAL | Status: DC

## 2024-02-08 MED ORDER — POTASSIUM CHLORIDE 10 MEQ/50ML IV SOLN: 10.0000 meq | INTRAVENOUS | Status: AC

## 2024-02-08 MED ORDER — ACETAMINOPHEN 160 MG/5ML PO SOLN
650.0000 mg | Freq: Once | ORAL | Status: AC
  Administered 2024-02-08: 650 mg
  Filled 2024-02-08: qty 20.3

## 2024-02-08 MED ORDER — SODIUM CHLORIDE 0.9 % IV SOLN: INTRAVENOUS | Status: DC | PRN

## 2024-02-08 MED ORDER — PROPOFOL 10 MG/ML IV BOLUS
INTRAVENOUS | Status: AC
  Filled 2024-02-08: qty 20

## 2024-02-08 MED ORDER — CHLORHEXIDINE GLUCONATE 0.12 % MT SOLN
15.0000 mL | Freq: Once | OROMUCOSAL | Status: AC
  Administered 2024-02-08: 15 mL via OROMUCOSAL
  Filled 2024-02-08: qty 15

## 2024-02-08 MED ORDER — ~~LOC~~ CARDIAC SURGERY, PATIENT & FAMILY EDUCATION
Freq: Once | Status: DC
  Filled 2024-02-08: qty 1

## 2024-02-08 MED ORDER — CHLORHEXIDINE GLUCONATE 0.12 % MT SOLN
15.0000 mL | OROMUCOSAL | Status: AC
  Administered 2024-02-08: 15 mL via OROMUCOSAL
  Filled 2024-02-08: qty 15

## 2024-02-08 MED ORDER — LIDOCAINE 2% (20 MG/ML) 5 ML SYRINGE
INTRAMUSCULAR | Status: AC
  Filled 2024-02-08: qty 5

## 2024-02-08 MED ORDER — SODIUM CHLORIDE 0.9 % IV SOLN
20.0000 ug | INTRAVENOUS | Status: AC
  Administered 2024-02-08: 20 ug via INTRAVENOUS
  Filled 2024-02-08: qty 5

## 2024-02-08 MED ORDER — HEMOSTATIC AGENTS (NO CHARGE) OPTIME
TOPICAL | Status: DC | PRN
  Administered 2024-02-08: 1
  Administered 2024-02-08: 2

## 2024-02-08 MED ORDER — LACTATED RINGERS IV SOLN: INTRAVENOUS | Status: DC | PRN

## 2024-02-08 MED ORDER — LACTATED RINGERS IV SOLN: INTRAVENOUS | Status: AC

## 2024-02-08 MED ORDER — ALBUMIN HUMAN 5 % IV SOLN: INTRAVENOUS | Status: DC | PRN

## 2024-02-08 MED ORDER — ALBUMIN HUMAN 5 % IV SOLN
250.0000 mL | INTRAVENOUS | Status: DC | PRN
  Administered 2024-02-08 (×3): 12.5 g via INTRAVENOUS
  Filled 2024-02-08: qty 250

## 2024-02-08 MED ORDER — SODIUM CHLORIDE 0.9 % IV SOLN: INTRAVENOUS | Status: AC

## 2024-02-08 MED ORDER — PANTOPRAZOLE SODIUM 40 MG PO TBEC
40.0000 mg | DELAYED_RELEASE_TABLET | Freq: Every day | ORAL | Status: DC
  Administered 2024-02-10 – 2024-02-15 (×7): 40 mg via ORAL
  Filled 2024-02-08 (×6): qty 1

## 2024-02-08 MED ORDER — THROMBIN (RECOMBINANT) 20000 UNITS EX SOLR
CUTANEOUS | Status: AC
  Filled 2024-02-08: qty 20000

## 2024-02-08 MED ORDER — CALCIUM CARBONATE 1500 (600 CA) MG PO TABS
1500.0000 mg | ORAL_TABLET | Freq: Two times a day (BID) | ORAL | Status: DC
  Filled 2024-02-08: qty 1

## 2024-02-08 MED ORDER — GLYCOPYRROLATE PF 0.2 MG/ML IJ SOSY
PREFILLED_SYRINGE | INTRAMUSCULAR | Status: DC | PRN
  Administered 2024-02-08: .2 mg via INTRAVENOUS

## 2024-02-08 MED ORDER — CEFAZOLIN SODIUM-DEXTROSE 2-4 GM/100ML-% IV SOLN
2.0000 g | Freq: Three times a day (TID) | INTRAVENOUS | Status: AC
  Administered 2024-02-08 – 2024-02-10 (×6): 2 g via INTRAVENOUS
  Filled 2024-02-08 (×6): qty 100

## 2024-02-08 MED ORDER — TRAMADOL HCL 50 MG PO TABS
50.0000 mg | ORAL_TABLET | ORAL | Status: DC | PRN
  Administered 2024-02-09 (×2): 50 mg via ORAL
  Administered 2024-02-10: 100 mg via ORAL
  Administered 2024-02-11 – 2024-02-14 (×3): 50 mg via ORAL
  Filled 2024-02-08 (×2): qty 1
  Filled 2024-02-08: qty 2
  Filled 2024-02-08 (×3): qty 1

## 2024-02-08 MED ORDER — DEXMEDETOMIDINE HCL IN NACL 400 MCG/100ML IV SOLN: 0.0000 ug/kg/h | INTRAVENOUS | Status: DC

## 2024-02-08 MED ORDER — MAGNESIUM SULFATE 4 GM/100ML IV SOLN
4.0000 g | Freq: Once | INTRAVENOUS | Status: AC
  Administered 2024-02-08: 4 g via INTRAVENOUS
  Filled 2024-02-08: qty 100

## 2024-02-08 MED ORDER — PROPOFOL 1000 MG/100ML IV EMUL
INTRAVENOUS | Status: AC
  Filled 2024-02-08: qty 100

## 2024-02-08 MED ORDER — ROSUVASTATIN CALCIUM 20 MG PO TABS
20.0000 mg | ORAL_TABLET | Freq: Every day | ORAL | Status: DC
  Administered 2024-02-09 – 2024-02-15 (×8): 20 mg via ORAL
  Filled 2024-02-08 (×7): qty 1

## 2024-02-08 MED ORDER — SODIUM CHLORIDE 0.9% FLUSH
3.0000 mL | INTRAVENOUS | Status: DC | PRN
  Administered 2024-02-10: 3 mL via INTRAVENOUS

## 2024-02-08 MED ORDER — 0.9 % SODIUM CHLORIDE (POUR BTL) OPTIME
TOPICAL | Status: DC | PRN
  Administered 2024-02-08: 5000 mL

## 2024-02-08 MED ORDER — MORPHINE SULFATE (PF) 2 MG/ML IV SOLN: 1.0000 mg | INTRAVENOUS | Status: DC | PRN

## 2024-02-08 MED ORDER — SODIUM CHLORIDE 0.9% FLUSH
3.0000 mL | Freq: Two times a day (BID) | INTRAVENOUS | Status: DC
  Administered 2024-02-09 – 2024-02-11 (×5): 3 mL via INTRAVENOUS

## 2024-02-08 MED ORDER — PROTAMINE SULFATE 10 MG/ML IV SOLN
INTRAVENOUS | Status: AC
  Filled 2024-02-08: qty 25

## 2024-02-08 MED ORDER — PANTOPRAZOLE SODIUM 40 MG IV SOLR
40.0000 mg | Freq: Every day | INTRAVENOUS | Status: AC
  Administered 2024-02-08 – 2024-02-09 (×2): 40 mg via INTRAVENOUS
  Filled 2024-02-08 (×2): qty 10

## 2024-02-08 MED ORDER — THIAMINE MONONITRATE 100 MG PO TABS
100.0000 mg | ORAL_TABLET | Freq: Every day | ORAL | Status: DC
  Administered 2024-02-09 – 2024-02-15 (×8): 100 mg via ORAL
  Filled 2024-02-08 (×7): qty 1

## 2024-02-08 MED ORDER — CHLORHEXIDINE GLUCONATE 0.12 % MT SOLN: 15.0000 mL | Freq: Once | OROMUCOSAL | Status: DC

## 2024-02-08 MED ORDER — PHENYLEPHRINE 80 MCG/ML (10ML) SYRINGE FOR IV PUSH (FOR BLOOD PRESSURE SUPPORT)
PREFILLED_SYRINGE | INTRAVENOUS | Status: AC
  Filled 2024-02-08: qty 10

## 2024-02-08 MED ORDER — VANCOMYCIN HCL IN DEXTROSE 1-5 GM/200ML-% IV SOLN
1000.0000 mg | Freq: Once | INTRAVENOUS | Status: AC
  Administered 2024-02-08: 1000 mg via INTRAVENOUS
  Filled 2024-02-08: qty 200

## 2024-02-08 MED ORDER — FENTANYL CITRATE (PF) 250 MCG/5ML IJ SOLN
INTRAMUSCULAR | Status: DC | PRN
  Administered 2024-02-08: 50 ug via INTRAVENOUS
  Administered 2024-02-08: 75 ug via INTRAVENOUS
  Administered 2024-02-08 (×3): 100 ug via INTRAVENOUS
  Administered 2024-02-08: 200 ug via INTRAVENOUS
  Administered 2024-02-08: 100 ug via INTRAVENOUS
  Administered 2024-02-08: 25 ug via INTRAVENOUS

## 2024-02-08 MED ORDER — DEXTROSE 50 % IV SOLN: 0.0000 mL | INTRAVENOUS | Status: DC | PRN

## 2024-02-08 MED ORDER — HEPARIN SODIUM (PORCINE) 1000 UNIT/ML IJ SOLN
INTRAMUSCULAR | Status: AC
  Filled 2024-02-08: qty 1

## 2024-02-08 MED ORDER — MIDAZOLAM HCL (PF) 10 MG/2ML IJ SOLN
INTRAMUSCULAR | Status: AC
  Filled 2024-02-08: qty 2

## 2024-02-08 MED ORDER — CALCIUM CHLORIDE 10 % IV SOLN
INTRAVENOUS | Status: AC
  Filled 2024-02-08: qty 10

## 2024-02-08 MED ORDER — MIDAZOLAM HCL 5 MG/5ML IJ SOLN
INTRAMUSCULAR | Status: DC | PRN
  Administered 2024-02-08 (×3): 1 mg via INTRAVENOUS

## 2024-02-08 MED ORDER — HEPARIN SODIUM (PORCINE) 1000 UNIT/ML IJ SOLN
INTRAMUSCULAR | Status: DC | PRN
  Administered 2024-02-08: 23000 [IU] via INTRAVENOUS

## 2024-02-08 MED ORDER — FERROUS SULFATE 325 (65 FE) MG PO TABS
325.0000 mg | ORAL_TABLET | Freq: Every day | ORAL | Status: DC
  Administered 2024-02-09 – 2024-02-15 (×8): 325 mg via ORAL
  Filled 2024-02-08 (×7): qty 1

## 2024-02-08 MED ORDER — PROPOFOL 10 MG/ML IV BOLUS
INTRAVENOUS | Status: DC | PRN
  Administered 2024-02-08: 50 mg via INTRAVENOUS
  Administered 2024-02-08 (×2): 30 mg via INTRAVENOUS

## 2024-02-08 MED ORDER — METOPROLOL TARTRATE 25 MG/10 ML ORAL SUSPENSION: 12.5000 mg | Freq: Two times a day (BID) | ORAL | Status: DC

## 2024-02-08 MED ORDER — PHENYLEPHRINE 80 MCG/ML (10ML) SYRINGE FOR IV PUSH (FOR BLOOD PRESSURE SUPPORT)
PREFILLED_SYRINGE | INTRAVENOUS | Status: DC | PRN
  Administered 2024-02-08: 80 ug via INTRAVENOUS

## 2024-02-08 MED ORDER — SODIUM CHLORIDE 0.9 % IV SOLN: 250.0000 mL | INTRAVENOUS | Status: AC

## 2024-02-08 MED ORDER — SODIUM BICARBONATE 8.4 % IV SOLN
50.0000 meq | Freq: Once | INTRAVENOUS | Status: AC
  Administered 2024-02-08: 50 meq via INTRAVENOUS

## 2024-02-08 MED ORDER — BISACODYL 10 MG RE SUPP: 10.0000 mg | Freq: Every day | RECTAL | Status: DC

## 2024-02-08 MED ORDER — ASPIRIN 325 MG PO TBEC: 325.0000 mg | DELAYED_RELEASE_TABLET | Freq: Every day | ORAL | Status: DC

## 2024-02-08 MED ORDER — PROTAMINE SULFATE 10 MG/ML IV SOLN
INTRAVENOUS | Status: DC | PRN
  Administered 2024-02-08: 220 mg via INTRAVENOUS
  Administered 2024-02-08: 10 mg via INTRAVENOUS

## 2024-02-08 MED ORDER — CALCIUM CARBONATE 1250 (500 CA) MG PO TABS
1250.0000 mg | ORAL_TABLET | Freq: Two times a day (BID) | ORAL | Status: DC
  Administered 2024-02-09 – 2024-02-15 (×14): 1250 mg via ORAL
  Filled 2024-02-08 (×14): qty 1

## 2024-02-08 MED ORDER — METOPROLOL TARTRATE 12.5 MG HALF TABLET: 12.5000 mg | ORAL_TABLET | Freq: Two times a day (BID) | ORAL | Status: DC

## 2024-02-08 SURGICAL SUPPLY — 72 items
ADAPTER CARDIO PERF ANTE/RETRO (ADAPTER) ×3 IMPLANT
BAG DECANTER FOR FLEXI CONT (MISCELLANEOUS) ×3 IMPLANT
BLADE STERNUM SYSTEM 6 (BLADE) ×3 IMPLANT
BLADE SURG 15 STRL LF DISP TIS (BLADE) ×3 IMPLANT
CANISTER SUCTION 3000ML PPV (SUCTIONS) ×3 IMPLANT
CANNULA AORTIC ROOT 9FR (CANNULA) ×3 IMPLANT
CANNULA ARTERIAL VENT 3/8 20FR (CANNULA) IMPLANT
CANNULA GUNDRY RCSP 15FR (MISCELLANEOUS) ×3 IMPLANT
CANNULA MC2 2 STG 36/46 NON-V (CANNULA) IMPLANT
CANNULA SUMP PERICARDIAL (CANNULA) IMPLANT
CATH HEART VENT LEFT (CATHETERS) ×3 IMPLANT
CATH ROBINSON RED A/P 18FR (CATHETERS) ×9 IMPLANT
CATH THORACIC 36FR (CATHETERS) ×3 IMPLANT
CATH THORACIC 36FR RT ANG (CATHETERS) ×3 IMPLANT
CNTNR URN SCR LID CUP LEK RST (MISCELLANEOUS) ×3 IMPLANT
CONTAINER PROTECT SURGISLUSH (MISCELLANEOUS) ×3 IMPLANT
DRAPE WARM FLUID 44X44 (DRAPES) IMPLANT
DRSG COVADERM 4X14 (GAUZE/BANDAGES/DRESSINGS) ×3 IMPLANT
ELECT CAUTERY BLADE 6.4 (BLADE) ×3 IMPLANT
ELECTRODE REM PT RTRN 9FT ADLT (ELECTROSURGICAL) ×6 IMPLANT
FELT TEFLON 1X6 (MISCELLANEOUS) ×3 IMPLANT
GAUZE 4X4 16PLY ~~LOC~~+RFID DBL (SPONGE) ×3 IMPLANT
GAUZE SPONGE 4X4 12PLY STRL (GAUZE/BANDAGES/DRESSINGS) ×3 IMPLANT
GLOVE BIO SURGEON STRL SZ 6 (GLOVE) IMPLANT
GLOVE BIO SURGEON STRL SZ 6.5 (GLOVE) IMPLANT
GLOVE BIO SURGEON STRL SZ7 (GLOVE) IMPLANT
GLOVE BIO SURGEON STRL SZ7.5 (GLOVE) IMPLANT
GOWN STRL REUS W/ TWL LRG LVL3 (GOWN DISPOSABLE) ×12 IMPLANT
GOWN STRL REUS W/ TWL XL LVL3 (GOWN DISPOSABLE) ×3 IMPLANT
HEMOSTAT POWDER SURGIFOAM 1G (HEMOSTASIS) ×9 IMPLANT
HEMOSTAT SURGICEL 2X14 (HEMOSTASIS) ×3 IMPLANT
INSERT FOGARTY XLG (MISCELLANEOUS) IMPLANT
IV CATH 22GX1 FEP (IV SOLUTION) IMPLANT
KIT BASIN OR (CUSTOM PROCEDURE TRAY) ×3 IMPLANT
KIT SUCTION CATH 14FR (SUCTIONS) ×3 IMPLANT
KIT TURNOVER KIT B (KITS) ×3 IMPLANT
LINE VENT (MISCELLANEOUS) IMPLANT
NS IRRIG 1000ML POUR BTL (IV SOLUTION) ×15 IMPLANT
PACK E OPEN HEART (SUTURE) ×3 IMPLANT
PACK OPEN HEART (CUSTOM PROCEDURE TRAY) ×3 IMPLANT
PAD ARMBOARD POSITIONER FOAM (MISCELLANEOUS) ×6 IMPLANT
POSITIONER HEAD DONUT 9IN (MISCELLANEOUS) ×3 IMPLANT
SEALANT HEMOST PREVELEAK 4ML (HEMOSTASIS) IMPLANT
SET MPS 3-ND DEL (MISCELLANEOUS) IMPLANT
SET VEIN GRAFT PERF (SET/KITS/TRAYS/PACK) IMPLANT
SPONGE T-LAP 18X18 ~~LOC~~+RFID (SPONGE) ×12 IMPLANT
SPONGE T-LAP 4X18 ~~LOC~~+RFID (SPONGE) ×3 IMPLANT
SUT BONE WAX W31G (SUTURE) ×3 IMPLANT
SUT EB EXC GRN/WHT 2-0 V-5 (SUTURE) ×6 IMPLANT
SUT ETHIBON EXCEL 2-0 V-5 (SUTURE) IMPLANT
SUT ETHIBOND 2 0 SH 36X2 (SUTURE) IMPLANT
SUT ETHIBOND V-5 VALVE (SUTURE) IMPLANT
SUT PROLENE 3 0 SH 1 (SUTURE) ×3 IMPLANT
SUT PROLENE 3 0 SH 48 (SUTURE) ×6 IMPLANT
SUT PROLENE 4-0 RB1 .5 CRCL 36 (SUTURE) ×12 IMPLANT
SUT PROLENE 5 0 C 1 36 (SUTURE) IMPLANT
SUT SILK 2 0 SH CR/8 (SUTURE) IMPLANT
SUT STEEL 6MS V (SUTURE) IMPLANT
SUT VIC AB 1 CTX36XBRD ANBCTR (SUTURE) ×6 IMPLANT
SUT VIC AB 2-0 CT1 TAPERPNT 27 (SUTURE) IMPLANT
SUT VIC AB 3-0 X1 27 (SUTURE) IMPLANT
SYSTEM SAHARA CHEST DRAIN ATS (WOUND CARE) ×3 IMPLANT
TAPE CLOTH SURG 4X10 WHT LF (GAUZE/BANDAGES/DRESSINGS) IMPLANT
TAPE PAPER 2X10 WHT MICROPORE (GAUZE/BANDAGES/DRESSINGS) IMPLANT
TOWEL GREEN STERILE (TOWEL DISPOSABLE) ×3 IMPLANT
TOWEL GREEN STERILE FF (TOWEL DISPOSABLE) ×3 IMPLANT
TRAY FOLEY SLVR 14FR TEMP STAT (SET/KITS/TRAYS/PACK) ×3 IMPLANT
TUBE CONNECTING 20X1/4 (TUBING) IMPLANT
UNDERPAD 30X36 HEAVY ABSORB (UNDERPADS AND DIAPERS) ×3 IMPLANT
VALVE AORTIC KONECT RESILIA 25 (Valve) IMPLANT
WATER STERILE IRR 1000ML POUR (IV SOLUTION) ×6 IMPLANT
YANKAUER SUCT BULB TIP NO VENT (SUCTIONS) IMPLANT

## 2024-02-08 NOTE — Anesthesia Postprocedure Evaluation (Signed)
 Anesthesia Post Note  Patient: Hannah Willis  Procedure(s) Performed: BENTALL PROCEDURE USING KONECT AORTIC VALVE CONDUIT SIZE (Chest) ECHOCARDIOGRAM, TRANSESOPHAGEAL, INTRAOPERATIVE     Patient location during evaluation: ICU Anesthesia Type: General Level of consciousness: sedated Pain management: pain level controlled Vital Signs Assessment: post-procedure vital signs reviewed and stable Respiratory status: patient on ventilator - see flowsheet for VS and patient remains intubated per anesthesia plan Cardiovascular status: blood pressure returned to baseline and stable Postop Assessment: no apparent nausea or vomiting Anesthetic complications: no   No notable events documented.  Last Vitals:  Vitals:   02/08/24 1509 02/08/24 1510  BP:    Pulse: 74 75  Resp: 16 16  Temp: 36.6 C 36.7 C  SpO2: 97% 97%    Last Pain:  Vitals:   02/08/24 0612  TempSrc:   PainSc: 0-No pain                 Hannah Willis

## 2024-02-08 NOTE — Progress Notes (Signed)
 EVENING ROUNDS NOTE :     [address]       [place] [address]             [phone]                 * Day of Surgery * Procedure(s) (LRB): BENTALL PROCEDURE USING KONECT AORTIC VALVE CONDUIT SIZE (N/A) ECHOCARDIOGRAM, TRANSESOPHAGEAL, INTRAOPERATIVE (N/A)   Total Length of Stay:  LOS: 0 days  Events:   No events Good hemodynamics Low CT output      BP 113/69   Pulse 74   Temp 99 F (37.2 C)   Resp 16   Ht 5' 2 (1.575 m)   Wt 64.9 kg   SpO2 98%   BMI 26.16 kg/m   PAP: (17-33)/(9-22) 19/11 CO:  [2.6 L/min-3.2 L/min] 2.9 L/min CI:  [1.56 L/min/m2-1.9 L/min/m2] 1.8 L/min/m2  Vent Mode: SIMV;PSV;PRVC FiO2 (%):  [50 %] 50 % Set Rate:  [16 bmp] 16 bmp Vt Set:  [400 mL] 400 mL PEEP:  [5 cmH20] 5 cmH20 Pressure Support:  [10 cmH20] 10 cmH20 Plateau Pressure:  [17 cmH20] 17 cmH20   sodium chloride  10 mL/hr at 02/08/24 1600   [START ON 02/09/2024] sodium chloride      sodium chloride  20 mL/hr at 02/08/24 1600   albumin  human 999 mL/hr at 02/08/24 1600    ceFAZolin  (ANCEF ) IV Stopped (02/08/24 1420)   dexmedetomidine  (PRECEDEX ) IV infusion 0.3 mcg/kg/hr (02/08/24 1600)   insulin  1.1 Units/hr (02/08/24 1600)   lactated ringers  Stopped (02/08/24 1305)   lactated ringers  20 mL/hr at 02/08/24 1600   magnesium  sulfate 20 mL/hr at 02/08/24 1600   phenylephrine  (NEO-SYNEPHRINE) Adult infusion 30 mcg/min (02/08/24 1600)   vancomycin       No intake/output data recorded.      Latest Ref Rng & Units 02/08/2024    1:12 PM 02/08/2024   11:56 AM 02/08/2024   11:47 AM  CBC  WBC 4.0 - 10.5 K/uL 15.4     Hemoglobin 12.0 - 15.0 g/dL 88.7  6.8  6.8   Hematocrit 36.0 - 46.0 % 32.3  20.0  20.0   Platelets 150 - 400 K/uL 57          Latest Ref Rng & Units 02/08/2024   11:56 AM 02/08/2024   11:47 AM 02/08/2024   11:20 AM  BMP  Glucose 70 - 99 mg/dL  856  860   BUN 8 - 23 mg/dL  16  19   Creatinine 9.55 - 1.00 mg/dL  9.59  9.59   Sodium 864 - 145 mmol/L  141  140  139   Potassium 3.5 - 5.1 mmol/L 3.7  3.7  4.6   Chloride 98 - 111 mmol/L  105  106     ABG    Component Value Date/Time   PHART 7.433 02/08/2024 1156   PCO2ART 33.3 02/08/2024 1156   PO2ART 404 (H) 02/08/2024 1156   HCO3 22.3 02/08/2024 1156   TCO2 23 02/08/2024 1156   ACIDBASEDEF 2.0 02/08/2024 1156   O2SAT 100 02/08/2024 1156       Linnie Rayas, MD 02/08/2024 4:04 PM

## 2024-02-08 NOTE — Anesthesia Procedure Notes (Signed)
 Arterial Line Insertion Start/End8/10/2023 7:15 AM, 02/08/2024 7:17 AM Performed by: Leopoldo Wanda DASEN, CRNA, CRNA  Preanesthetic checklist: patient identified, IV checked, site marked, risks and benefits discussed, surgical consent, monitors and equipment checked, pre-op evaluation, timeout performed and anesthesia consent Lidocaine  1% used for infiltration and patient sedated Left, radial was placed Catheter size: 20 G Hand hygiene performed  and maximum sterile barriers used  Allen's test indicative of satisfactory collateral circulation Attempts: 1 Procedure performed without using ultrasound guided technique. Following insertion, Biopatch and dressing applied. Post procedure assessment: normal  Patient tolerated the procedure well with no immediate complications.

## 2024-02-08 NOTE — Anesthesia Preprocedure Evaluation (Addendum)
 Anesthesia Evaluation  Patient identified by MRN, date of birth, ID band Patient awake    Reviewed: Allergy & Precautions, NPO status , Patient's Chart, lab work & pertinent test results, reviewed documented beta blocker date and time   History of Anesthesia Complications (+) PONV and history of anesthetic complications  Airway Mallampati: II  TM Distance: >3 FB     Dental no notable dental hx.    Pulmonary sleep apnea , neg COPD, former smoker   breath sounds clear to auscultation       Cardiovascular hypertension, (-) CAD, (-) Past MI, (-) Cardiac Stents and (-) CABG + Valvular Problems/Murmurs AI  Rhythm:Regular Rate:Normal  IMPRESSIONS     1. AI is central with eccentric course; likely due to dilated aortic  root.   2. Left ventricular ejection fraction, by estimation, is 45 to 50%. The  left ventricle has mildly decreased function. The left ventricle  demonstrates global hypokinesis. The left ventricular internal cavity size  was moderately dilated.   3. Right ventricular systolic function is normal. The right ventricular  size is normal.   4. Left atrial size was severely dilated. No left atrial/left atrial  appendage thrombus was detected.   5. 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 There is moderate holosystolic prolapse of the middle  segment of the anterior leaflet of the mitral valve.   6. The aortic valve is tricuspid. Aortic valve regurgitation is severe.  Aortic valve sclerosis is present, with no evidence of aortic valve  stenosis.   7. Aortic dilatation noted. There is moderate dilatation of the aortic  root, measuring 47 mm. There is Moderate (Grade III) plaque involving the  descending aorta.   8. 3D performed of the mitral valve and 3D performed of the aortic valve.     Neuro/Psych  Headaches  Neuromuscular disease CVA, No Residual Symptoms    GI/Hepatic PUD,GERD   Medicated and Controlled,,  Endo/Other    Renal/GU Renal disease     Musculoskeletal  (+) Arthritis ,    Abdominal   Peds  Hematology  (+) Blood dyscrasia, anemia   Anesthesia Other Findings   Reproductive/Obstetrics                              Anesthesia Physical Anesthesia Plan  ASA: 4  Anesthesia Plan: General   Post-op Pain Management:    Induction: Intravenous  PONV Risk Score and Plan: 2 and Ondansetron  and Dexamethasone   Airway Management Planned: Oral ETT  Additional Equipment: ClearSight, Arterial line, CVP, TEE and 3D TEE  Intra-op Plan:   Post-operative Plan: Post-operative intubation/ventilation  Informed Consent: I have reviewed the patients History and Physical, chart, labs and discussed the procedure including the risks, benefits and alternatives for the proposed anesthesia with the patient or authorized representative who has indicated his/her understanding and acceptance.     Dental advisory given  Plan Discussed with: CRNA  Anesthesia Plan Comments:         Anesthesia Quick Evaluation

## 2024-02-08 NOTE — Hospital Course (Addendum)
 History of Present Illness:  Hannah Willis is a 73 year old woman with a history of hypertension, hyperlipidemia, mitral valve prolapse, OSA not on CPAP, and remote stroke who recently presented with progressive exertional shortness of breath and fatigue particularly with walking uphill. She also reports some chest heaviness. She has had lower extremity edema which is improved with Lasix . She has had occasional episodes of dizziness but no syncope. She had a history of mitral valve prolapse which was mildly symptomatic and her last chocardiogram was in 2011. 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 There is mild to moderate mitral valve regurgitation with holosystolic prolapse of the middle segment of the anterior mitral leaflet. The aortic valve was trileaflet with moderate to severe aortic insufficiency with a pressure half-time of 276 ms. The aortic root was noted to be 4.5 cm. Left ventricular ejection fraction was 45 to 50% with global hypokinesis and mild LV dilation to 5.9 cm. A TEE was performed on 12/25/2023 showing ejection fraction of 45 to 50%. The aortic valve was trileaflet with severe central AI with an eccentric course. The aortic root was measured at 4.7 cm. The mitral valve is myxomatous with moderate holosystolic prolapse of the middle segment of the anterior leaflet with mild to moderate mitral valve regurgitation. Cardiac catheterization on the same day showed mild nonobstructive coronary disease with an elevated LVEDP of 30. PA pressure was 30/15 with a mean of 16. Wedge pressure was 12. Right atrial pressure was 7. CTA of the chest showed the aortic root to measure 5.2 cm. The patient was evaluated by Dr. Lucas who felt surgical intervention would be indicated.  The risks and benefits of the procedure were explained to the patient and she was agreeable proceed.  Hospital Course:  Hannah Willis presented to [facility] on 02/08/2024.  She was taken to  the operating room and underwent Bentall Procedure with a 25 mm Edwards Resilia Konect Bioprosthetic Conduit.  She tolerated the procedure without difficulty and was taken to the SICU in stable condition.  She was extubated the evening of surgery.  She did not require pressor support post operatively.  She was started on Lasix  to facilitate diuresis.  Metolazone  was added for additional diuretic response.  She had expected post operative thrombocytopenia, due to this she was not started on Lovenox for Aspirin .  The patient developed Atrial Fibrillation with RVR.  She was treated with Amiodarone  bolus and drip.  The patient's chest tubes were removed without difficulty.  She was stable for transfer to the progressive care unit on 02/11/2024.  She remained in rate controlled Atrial Fibrillation.  She was started on Eliquis  for stroke prophylaxis/prevention. Her lopressor  was also increased to 75 mg BID to aid in HR control. She had persistent complaints of nausea.  This was treated with anti-emetics. There was a concern this could be related to Amiodarone  use. However she had not moved her bowels since surgery. She was given a dose of lactulose  for mild constipation and her bowels began moving. Her nausea resolved.  She converted to NSR with additional increase of her Lopressor .  She developed hypokalemia which was supplemented. She is ambulating independently.  Her surgical incisions are healing without evidence of infection.  She does have an area of swelling on the right side which is likely hematoma.  She was given instructions on how to monitor.  She is stable for discharge home today.

## 2024-02-08 NOTE — Brief Op Note (Signed)
 02/08/2024  8:28 AM  PATIENT:  Hannah Willis  73 y.o. female  PRE-OPERATIVE DIAGNOSIS:  SEVERE AI  TAA  POST-OPERATIVE DIAGNOSIS:  SEVERE AI TAA  PROCEDURE:  Procedure(s):  BENTALL PROCEDURE  -25 mm Edwards Resilia Konect Bioprosthetic Valve -Reimplantation of Right and Left Coronary Arteries -Endarterectomy Right Coronary Button  ECHOCARDIOGRAM, TRANSESOPHAGEAL, INTRAOPERATIVE (N/A)  SURGEON:  Surgeons and Role:    * Lucas Dorise POUR, MD - Primary  PHYSICIAN ASSISTANT: Rocky Shad PA-C   ASSISTANTS: Luke Stacks RNFA   ANESTHESIA:   general  EBL:  850 mL   BLOOD ADMINISTERED:  CC CELLSAVER  DRAINS: Mediastinal Chest Drains   LOCAL MEDICATIONS USED:  NONE  SPECIMEN:  Source of Specimen:  Aortic Root Aneurysm, Aortic Valve Leaflets  DISPOSITION OF SPECIMEN:  PATHOLOGY  COUNTS:  YES  TOURNIQUET:  * No tourniquets in log *  DICTATION: .Dragon Dictation  PLAN OF CARE: Admit to inpatient   PATIENT DISPOSITION:  ICU - intubated and hemodynamically stable.   Delay start of Pharmacological VTE agent (>24hrs) due to surgical blood loss or risk of bleeding: yes

## 2024-02-08 NOTE — Interval H&P Note (Signed)
 History and Physical Interval Note:  02/08/2024 6:43 AM  Hannah Willis  has presented today for surgery, with the diagnosis of SEVERE AI  TAA.  The various methods of treatment have been discussed with the patient and family. After consideration of risks, benefits and other options for treatment, the patient has consented to  Procedure(s): BENTALL PROCEDURE (N/A) ECHOCARDIOGRAM, TRANSESOPHAGEAL, INTRAOPERATIVE (N/A) as a surgical intervention.  The patient's history has been reviewed, patient examined, no change in status, stable for surgery.  I have reviewed the patient's chart and labs.  Questions were answered to the patient's satisfaction.     Ibraheem Voris K Mia Milan

## 2024-02-08 NOTE — Anesthesia Procedure Notes (Signed)
 Central Venous Catheter Insertion Performed by: Keneth Lynwood POUR, MD, anesthesiologist Start/End8/10/2023 7:00 AM, 02/08/2024 7:15 AM Patient location: Pre-op. Preanesthetic checklist: patient identified, IV checked, site marked, risks and benefits discussed, surgical consent, monitors and equipment checked, pre-op evaluation, timeout performed and anesthesia consent Position: Trendelenburg Lidocaine  1% used for infiltration and patient sedated Hand hygiene performed , maximum sterile barriers used  and Seldinger technique used Catheter size: 8.5 Fr Total catheter length 10. Central line and PA cath was placed.MAC introducer Swan type:thermodilution PA Cath depth:50 Procedure performed using ultrasound guided technique. Ultrasound Notes:anatomy identified, needle tip was noted to be adjacent to the nerve/plexus identified, no ultrasound evidence of intravascular and/or intraneural injection and image(s) printed for medical record Attempts: 1 Following insertion, line sutured and dressing applied. Post procedure assessment: blood return through all ports, free fluid flow and no air  Patient tolerated the procedure well with no immediate complications.

## 2024-02-08 NOTE — Procedures (Signed)
 Extubation Procedure Note  Patient Details:   Name: Hannah Willis DOB: [birth year] MRN: [id number]   Airway Documentation:    Vent end date: 02/08/24 Vent end time: 1705   Evaluation  O2 sats: stable throughout Complications: No apparent complications Patient did tolerate procedure well. Bilateral Breath Sounds: Clear   Yes  Pt was extubated per rapid wean protocol and placed on 4 L [place].  VC 950 ml and NIF -20 on the ventilator. Cuff leak noted prior to extubation and no stridor post.Pt is stable. RT will monitor.   Arabela Basaldua 02/08/2024, 5:15 PM

## 2024-02-08 NOTE — Anesthesia Procedure Notes (Signed)
 Procedure Name: Intubation Date/Time: 02/08/2024 7:45 AM  Performed by: Leopoldo Wanda DASEN, CRNAPre-anesthesia Checklist: Patient identified, Emergency Drugs available, Suction available and Patient being monitored Patient Re-evaluated:Patient Re-evaluated prior to induction Oxygen Delivery Method: Circle System Utilized Preoxygenation: Pre-oxygenation with 100% oxygen Induction Type: IV induction Ventilation: Mask ventilation without difficulty Laryngoscope Size: Mac and 3 Grade View: Grade I Tube type: Oral Tube size: 7.5 mm Number of attempts: 1 Airway Equipment and Method: Stylet Placement Confirmation: ETT inserted through vocal cords under direct vision, positive ETCO2 and breath sounds checked- equal and bilateral Secured at: 21 cm Tube secured with: Tape Dental Injury: Teeth and Oropharynx as per pre-operative assessment

## 2024-02-08 NOTE — Transfer of Care (Signed)
 Immediate Anesthesia Transfer of Care Note  Patient: Hannah Willis  Procedure(s) Performed: BENTALL PROCEDURE USING KONECT AORTIC VALVE CONDUIT SIZE (Chest) ECHOCARDIOGRAM, TRANSESOPHAGEAL, INTRAOPERATIVE  Patient Location: SICU  Anesthesia Type:General  Level of Consciousness: sedated and Patient remains intubated per anesthesia plan  Airway & Oxygen Therapy: Patient remains intubated per anesthesia plan and Patient placed on Ventilator (see vital sign flow sheet for setting)  Post-op Assessment: Report given to RN and Post -op Vital signs reviewed and stable  Post vital signs: Reviewed and stable  Last Vitals:  Vitals Value Taken Time  BP 98/51 02/08/24 13:12  Temp 35 C 02/08/24 13:13  Pulse 80 02/08/24 13:12  Resp 16 02/08/24 13:13  SpO2 99 % 02/08/24 13:12  Vitals shown include unfiled device data.  Last Pain:  Vitals:   02/08/24 0612  TempSrc:   PainSc: 0-No pain         Complications: No notable events documented.

## 2024-02-08 NOTE — Progress Notes (Signed)
  Echocardiogram Echocardiogram Transesophageal has been performed.  Hannah Willis 02/08/2024, 8:54 AM

## 2024-02-08 NOTE — Op Note (Signed)
 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   Biological Bentall Procedure using a 25 mm Edwards KONECT RESILIA pericardial valved conduit. Reimplantation of left and right coronary arteries  Anesthesia:  General Endotracheal   Clinical History/Surgical Indication:  This 73 year old woman has stage D, severe, symptomatic aortic insufficiency with NYHA class II symptoms of exertional fatigue and shortness of breath. 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 She has mildly decreased left ventricular systolic function with an ejection fraction of 45 to 50% with global hypokinesis and a moderately dilated left ventricular cavity. I agree that replacement of her aortic valve and aortic root aneurysm is indicated for relief of her symptoms and to prevent progressive left ventricular dysfunction and aortic dissection. Given her age I would recommend replacement using a bioprosthetic valved conduit with reimplantation of her coronary arteries. She has mild nonobstructive coronary disease and mild to moderate mitral regurgitation due to myxomatous mitral valve with anterior leaflet prolapse. I do not think these require intervention. I reviewed the CT and echo images with the patient and her husband and answered their questions. I discussed the operative procedure with the patient and her husband including alternatives, benefits and risks; including but not limited to bleeding, blood transfusion, infection, stroke, myocardial infarction, heart block requiring a permanent pacemaker, organ dysfunction, and death. Hannah Willis understands and  agrees to proceed.   Preparation:  The patient was seen in the preoperative holding area and the correct patient, correct operation were confirmed with the patient after reviewing the medical record and catheterization. The consent was signed by me. Preoperative antibiotics were given. A pulmonary arterial line and radial arterial line were placed by the anesthesia team. The patient was taken back to the operating room and positioned supine on the operating room table. After being placed under general endotracheal anesthesia by the anesthesia team a foley catheter was placed. The neck, chest, abdomen, and both legs were prepped with betadine soap and solution and draped in the usual sterile manner. A surgical time-out was taken and the correct patient and operative procedure were confirmed with the nursing and anesthesia staff.  TEE:  Performed by Dr. Keneth. This showed moderate AI, mild MR, normal LV systolic function.   Cardiopulmonary Bypass:  A median sternotomy was performed. The pericardium was opened in the midline. Right ventricular function appeared normal. The aortic root and proximal to mid ascending aorta were aneursymal with tapering to fairly normal size in the distal ascending aorta.  There were no contraindications to aortic cannulation or cross-clamping. The patient was fully systemically heparinized and the ACT was maintained > 400 sec. The proximal arch  was cannulated with a 2 F aortic cannula for arterial inflow. Venous cannulation was performed via the right atrial appendage using a two-staged venous cannula. Aortic occlusion was performed with a single cross-clamp. Systemic cooling to 32 degrees Centigrade and topical cooling of the heart with iced saline were used. Cold KBC retrograde cardioplegia was used to induce diastolic arrest and was then given retrograde at about 60 minute intervals throughout the period of arrest to maintain myocardial temperature at or below 10 degrees  centigrade. A  temperature probe was inserted into the interventricular septum and an insulating pad was placed in the pericardium. CO2 was insufflated into the pericardium throughout the case to minimize intracardiac air.     Bentall Procedure:   The ascending aorta was transected 1 cm proximal to the cross clamp and mobilized from the right pulmonary artery and main PA. It was opened longitudinally and the valve inspected. It was a trileaflet valve with prolapse of the non-coronary leaflet. There were multiple fenestrations at the commissures and lack of central coaptation. The right and left coronary arteries were removed from the aortic root with a button of aortic wall around the ostia. The left coronary ostia had a small amount of calcium  around it but no stenosis. The RCA ostium also had some calcified plaque around it but no stenosis. They were retracted carefully out of the way with stay sutures to prevent rotation. The native valve was excised taking care to remove all particulate debri. The annulus was sized and a 25 mm Edwards KONECT RESILIA pericardial valved conduit was chosen. ( Model # [id number], Serial # [id number]). A series of pledgetted 2-0 Ethibond horizontal mattress sutures were placed around the annulus with the pledgets in a sub-annular position. The sutures were placed through the valve sewing ring. The valve was lowered into place and the sutures tied . The valve seated nicely.  Small openings were made in the graft for the coronary anastomoses using a thermal cautery. Then the left and right coronary buttons were anastomosed to the graft in an end to side manner using continuous 5-0 prolene suture. A light coating of Prevaleal was applied to each anastomosis for hemostasis. The graft was then cut to the appropriate length and anastomosed end to end to the distal ascending aorta using continuous 3-0 prolene suture with a felt strip. Prevaleak was applied to seal the needle holes in the  grafts. A vent cannula was placed into the graft to remove any air. Deairing maneuvers were performed and the bed placed in trendelenburg position.   Completion:   The patient was rewarmed to 37 degrees Centigrade. A dose of warm retrograde reanimation cardioplegia was given. The crossclamp was removed with a time of 119 minutes. There was spontaneous return of ventricular fibrillation and the heart was defibrillated to sinus rhythm. The position of the graft was satisfactory. The vascular anastomoses all appeared hemostatic. Two temporary epicardial pacing wires were placed on the right atrium and two on the right ventricle. The patient was weaned from CPB without difficulty on no inotropic agents. CPB time was . Cardiac output was 4 LPM. TEE showed a normal functioning aortic valve prosthesis with no AI. There was unchanged mild MR. LV function appeared normal. Heparin  was fully reversed with protamine  and the aortic and venous cannulas removed. Hemostasis was achieved. Mediastinal drainage tubes were placed. The sternum was closed with  #6 stainless steel wires. The fascia was closed with continuous # 1 vicryl suture. The subcutaneous tissue was closed with 2-0 vicryl continuous suture. The skin was closed with 3-0 vicryl subcuticular suture. All sponge, needle, and instrument counts were reported correct at the end of the case. Dry sterile dressings were placed over the incisions and around the chest tubes which were connected to pleurevac suction. The patient was then transported to the surgical intensive care unit in stable condition.

## 2024-02-09 ENCOUNTER — Encounter (HOSPITAL_COMMUNITY): Payer: Self-pay | Admitting: Surgery

## 2024-02-09 ENCOUNTER — Inpatient Hospital Stay (HOSPITAL_COMMUNITY)

## 2024-02-09 DIAGNOSIS — R0989 Other specified symptoms and signs involving the circulatory and respiratory systems: Secondary | ICD-10-CM | POA: Diagnosis not present

## 2024-02-09 DIAGNOSIS — J984 Other disorders of lung: Secondary | ICD-10-CM | POA: Diagnosis not present

## 2024-02-09 DIAGNOSIS — R918 Other nonspecific abnormal finding of lung field: Secondary | ICD-10-CM | POA: Diagnosis not present

## 2024-02-09 DIAGNOSIS — Z48812 Encounter for surgical aftercare following surgery on the circulatory system: Secondary | ICD-10-CM | POA: Diagnosis not present

## 2024-02-09 DIAGNOSIS — Z95828 Presence of other vascular implants and grafts: Secondary | ICD-10-CM

## 2024-02-09 LAB — CBC
HCT: 29.2 % — ABNORMAL LOW (ref 36.0–46.0)
HCT: 29.1 % — ABNORMAL LOW (ref 36.0–46.0)
Hemoglobin: 10.1 g/dL — ABNORMAL LOW (ref 12.0–15.0)
Hemoglobin: 10.3 g/dL — ABNORMAL LOW (ref 12.0–15.0)
MCH: 32 pg (ref 26.0–34.0)
MCH: 32.9 pg (ref 26.0–34.0)
MCHC: 34.6 g/dL (ref 30.0–36.0)
MCHC: 35.4 g/dL (ref 30.0–36.0)
MCV: 92.4 fL (ref 80.0–100.0)
MCV: 93 fL (ref 80.0–100.0)
Platelets: 68 K/uL — ABNORMAL LOW (ref 150–400)
Platelets: 64 K/uL — ABNORMAL LOW (ref 150–400)
RBC: 3.16 MIL/uL — ABNORMAL LOW (ref 3.87–5.11)
RBC: 3.13 MIL/uL — ABNORMAL LOW (ref 3.87–5.11)
RDW: 14.8 % (ref 11.5–15.5)
RDW: 14.8 % (ref 11.5–15.5)
WBC: 14.1 K/uL — ABNORMAL HIGH (ref 4.0–10.5)
WBC: 20.5 K/uL — ABNORMAL HIGH (ref 4.0–10.5)
nRBC: 0 % (ref 0.0–0.2)
nRBC: 0.1 % (ref 0.0–0.2)

## 2024-02-09 LAB — BASIC METABOLIC PANEL WITH GFR
Anion gap: 7 (ref 5–15)
Anion gap: 7 (ref 5–15)
BUN: 17 mg/dL (ref 8–23)
BUN: 15 mg/dL (ref 8–23)
CO2: 21 mmol/L — ABNORMAL LOW (ref 22–32)
CO2: 23 mmol/L (ref 22–32)
Calcium: 7.7 mg/dL — ABNORMAL LOW (ref 8.9–10.3)
Calcium: 7.6 mg/dL — ABNORMAL LOW (ref 8.9–10.3)
Chloride: 110 mmol/L (ref 98–111)
Chloride: 103 mmol/L (ref 98–111)
Creatinine, Ser: 0.6 mg/dL (ref 0.44–1.00)
Creatinine, Ser: 0.66 mg/dL (ref 0.44–1.00)
GFR, Estimated: >60 mL/min (ref >60)
GFR, Estimated: >60 mL/min (ref >60)
Glucose, Bld: 127 mg/dL — ABNORMAL HIGH (ref 70–99)
Glucose, Bld: 159 mg/dL — ABNORMAL HIGH (ref 70–99)
Potassium: 3.9 mmol/L (ref 3.5–5.1)
Potassium: 4.1 mmol/L (ref 3.5–5.1)
Sodium: 138 mmol/L (ref 135–145)
Sodium: 133 mmol/L — ABNORMAL LOW (ref 135–145)

## 2024-02-09 LAB — GLUCOSE, CAPILLARY
Glucose-Capillary: 115 mg/dL — ABNORMAL HIGH (ref 70–99)
Glucose-Capillary: 126 mg/dL — ABNORMAL HIGH (ref 70–99)
Glucose-Capillary: 137 mg/dL — ABNORMAL HIGH (ref 70–99)
Glucose-Capillary: 139 mg/dL — ABNORMAL HIGH (ref 70–99)
Glucose-Capillary: 144 mg/dL — ABNORMAL HIGH (ref 70–99)
Glucose-Capillary: 148 mg/dL — ABNORMAL HIGH (ref 70–99)
Glucose-Capillary: 148 mg/dL — ABNORMAL HIGH (ref 70–99)
Glucose-Capillary: 148 mg/dL — ABNORMAL HIGH (ref 70–99)
Glucose-Capillary: 157 mg/dL — ABNORMAL HIGH (ref 70–99)

## 2024-02-09 LAB — MAGNESIUM
Magnesium: 2.6 mg/dL — ABNORMAL HIGH (ref 1.7–2.4)
Magnesium: 2.2 mg/dL (ref 1.7–2.4)

## 2024-02-09 MED ORDER — INSULIN ASPART 100 UNIT/ML IJ SOLN: 0.0000 [IU] | INTRAMUSCULAR | Status: DC

## 2024-02-09 MED ORDER — METOPROLOL SUCCINATE ER 25 MG PO TB24
25.0000 mg | ORAL_TABLET | Freq: Every day | ORAL | Status: DC
  Administered 2024-02-09 – 2024-02-10 (×2): 25 mg via ORAL
  Filled 2024-02-09 (×2): qty 1

## 2024-02-09 MED ORDER — POTASSIUM CHLORIDE 10 MEQ/50ML IV SOLN
10.0000 meq | INTRAVENOUS | Status: AC
  Administered 2024-02-09 (×3): 10 meq via INTRAVENOUS
  Filled 2024-02-09 (×3): qty 50

## 2024-02-09 MED ORDER — INSULIN ASPART 100 UNIT/ML IJ SOLN
0.0000 [IU] | INTRAMUSCULAR | Status: DC
  Administered 2024-02-09 – 2024-02-10 (×5): 2 [IU] via SUBCUTANEOUS
  Administered 2024-02-10: 4 [IU] via SUBCUTANEOUS
  Administered 2024-02-10: 2 [IU] via SUBCUTANEOUS

## 2024-02-09 MED ORDER — FUROSEMIDE 10 MG/ML IJ SOLN
40.0000 mg | Freq: Two times a day (BID) | INTRAMUSCULAR | Status: AC
  Administered 2024-02-09 (×2): 40 mg via INTRAVENOUS
  Filled 2024-02-09 (×2): qty 4

## 2024-02-09 MED ORDER — CHLORHEXIDINE GLUCONATE CLOTH 2 % EX PADS
6.0000 | MEDICATED_PAD | Freq: Every day | CUTANEOUS | Status: DC
  Administered 2024-02-09 – 2024-02-11 (×3): 6 via TOPICAL

## 2024-02-09 MED FILL — Thrombin (Recombinant) For Soln 20000 Unit: CUTANEOUS | Qty: 1 | Status: AC

## 2024-02-09 MED FILL — Albumin, Human Inj 5%: INTRAVENOUS | Qty: 250 | Status: AC

## 2024-02-09 MED FILL — Electrolyte-R (PH 7.4) Solution: INTRAVENOUS | Qty: 3000 | Status: AC

## 2024-02-09 MED FILL — Calcium Chloride Inj 10%: INTRAVENOUS | Qty: 10 | Status: AC

## 2024-02-09 MED FILL — Heparin Sodium (Porcine) Inj 1000 Unit/ML: INTRAMUSCULAR | Qty: 10 | Status: AC

## 2024-02-09 MED FILL — Sodium Bicarbonate IV Soln 8.4%: INTRAVENOUS | Qty: 50 | Status: AC

## 2024-02-09 MED FILL — Mannitol IV Soln 20%: INTRAVENOUS | Qty: 500 | Status: AC

## 2024-02-09 MED FILL — Lidocaine HCl Local Preservative Free (PF) Inj 2%: INTRAMUSCULAR | Qty: 14 | Status: AC

## 2024-02-09 MED FILL — Sodium Chloride IV Soln 0.9%: INTRAVENOUS | Qty: 2000 | Status: AC

## 2024-02-09 NOTE — Progress Notes (Addendum)
 1 Day Post-Op Procedure(s) (LRB): BENTALL PROCEDURE USING KONECT AORTIC VALVE CONDUIT SIZE (N/A) ECHOCARDIOGRAM, TRANSESOPHAGEAL, INTRAOPERATIVE (N/A) Subjective: No complaints  Objective: Vital signs in last 24 hours: Temp:  [94.5 F (34.7 C)-100 F (37.8 C)] 98.6 F (37 C) (08/05 9366) Pulse Rate:  [46-237] 77 (08/05 0633) Cardiac Rhythm: Atrial paced (08/05 0400) Resp:  [11-27] 19 (08/05 0633) BP: (87-154)/(37-94) 105/56 (08/05 0600) SpO2:  [68 %-100 %] 94 % (08/05 9366) Arterial Line BP: (76-303)/(25-215) 144/70 (08/05 0633) FiO2 (%):  [40 %-50 %] 40 % (08/04 1638) Weight:  [71 kg] 71 kg (08/05 0620)  Hemodynamic parameters for last 24 hours: PAP: (13-44)/(2-25) 25/17 PCWP:  [12 mmHg] 12 mmHg CO:  [2.6 L/min-4.7 L/min] 4.7 L/min CI:  [1.56 L/min/m2-2.82 L/min/m2] 2.82 L/min/m2  Intake/Output from previous day: 08/04 0701 - 08/05 0700 In: 7492.1 [I.V.:4551.7; Blood:1040; IV Piggyback:1900.4] Out: 3242 [Urine:1952; Blood:850; Chest Tube:440] Intake/Output this shift: Total I/O In: 953 [I.V.:743.2; IV Piggyback:209.7] Out: 540 [Urine:350; Chest Tube:190]  General appearance: alert and cooperative Neurologic: intact Heart: regular rate and rhythm Lungs: clear to auscultation bilaterally Extremities: edema mild Wound: dressing dry  Lab Results: Recent Labs    02/08/24 1902 02/09/24 0436  WBC 14.2* 14.1*  HGB 11.0* 10.1*  HCT 31.1* 29.2*  PLT 72* 68*   BMET:  Recent Labs    02/08/24 1902 02/09/24 0436  NA 140 138  K 4.2 3.9  CL 111 110  CO2 21* 21*  GLUCOSE 169* 127*  BUN 15 17  CREATININE 0.59 0.60  CALCIUM  7.9* 7.7*    PT/INR:  Recent Labs    02/08/24 1312  LABPROT 22.6*  INR 1.9*   ABG    Component Value Date/Time   PHART 7.375 02/08/2024 1831   HCO3 21.4 02/08/2024 1831   TCO2 23 02/08/2024 1831   ACIDBASEDEF 3.0 (H) 02/08/2024 1831   O2SAT 97 02/08/2024 1831   CBG (last 3)  Recent Labs    02/09/24 0205 02/09/24 0406  02/09/24 0607  GLUCAP 139* 126* 115*   CXR: mild left base atelectasis  ECG: pending.   Assessment/Plan: S/P Procedure(s) (LRB): BENTALL PROCEDURE USING KONECT AORTIC VALVE CONDUIT SIZE (N/A) ECHOCARDIOGRAM, TRANSESOPHAGEAL, INTRAOPERATIVE (N/A)  POD 1 Hemodynamically stable in NSR 70's. Will resume Toprol  tomorrow if rhythm remains stable.  Wt up 13.5 lbs postop. Start diuresis and KCL.  No hx of DM and normal Hgb A1c. Transition to SSI today.  Will keep pacing wires and chest tubes for now.  Thrombocytopenia: stable. Will hold aspirin  until they increase. No Lovenox.  IS, OOB, mobilize   LOS: 1 day    Hannah Willis 02/09/2024

## 2024-02-09 NOTE — TOC Initial Note (Signed)
 Transition of Care [facility]) - Initial/Assessment Note    Patient Details  Name: Everett Ricciardelli MRN: [id number] Date of Birth: 1950/10/26  [facility] CM/SW Contact:    Sudie Erminio Deems, RN Phone Number: 02/09/2024, 2:24 PM  Clinical Narrative: Patient POD-1 Bentall Procedure. PTA patient was from home with spouse. Patient states she has DME cane and bedside commode in the home. Patient has insurance and PCP-states she has transportation to appointments. [facility] is following the patient with TCTS office protocol. Case Manager will continue to follow for additional disposition needs.            Expected Discharge Plan: Home w Home Health Services Barriers to Discharge: Continued Medical Work up   Patient Goals and CMS Choice Patient states their goals for this hospitalization and ongoing recovery are:: Plan for home once stable  Expected Discharge Plan and Services   Discharge Planning Services: CM Consult   Living arrangements for the past 2 months: Single Family Home                   DME Agency: NA  Prior Living Arrangements/Services Living arrangements for the past 2 months: Single Family Home Lives with:: Spouse Patient language and need for interpreter reviewed:: Yes Do you feel safe going back to the place where you live?: Yes      Need for Family Participation in Patient Care: Yes (Comment) Care giver support system in place?: Yes (comment) Current home services: DME (Patient has cane and bedside commode.) Criminal Activity/Legal Involvement Pertinent to Current Situation/Hospitalization: No - Comment as needed  Permission Sought/Granted Permission sought to share information with : Case Manager, Family Supports   Emotional Assessment Appearance:: Appears stated age Attitude/Demeanor/Rapport: Engaged Affect (typically observed): Appropriate Orientation: : Oriented to Self, Oriented to Place, Oriented to  Time, Oriented to  Situation Alcohol / Substance Use: Not Applicable Psych Involvement: No (comment)  Admission diagnosis:  Severe aortic insufficiency [I35.1] Aneurysm of ascending aorta without rupture (HCC) [I71.21] 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] Patient Active Problem List   Diagnosis Date Noted   S/P Bentall Procedurewith 25 mm Edwards Konect Bioprosthetic Valve Conduit 02/08/2024   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   HFrEF (heart failure with reduced ejection fraction) (HCC) 01/20/2024   Severe aortic regurgitation 12/21/2023   Nonrheumatic mitral valve regurgitation 12/21/2023   Exertional chest pain 12/21/2023   S/P right TKA 12/31/2015   S/P knee replacement 12/31/2015   Diarrhea 10/22/2015   Ulcerative colitis (HCC) 10/22/2015   Rectal bleeding 10/22/2015   SPHEROCYTOSIS 07/19/2009   ANEMIA 07/19/2009   GERD 07/19/2009   MEMORY LOSS 07/19/2009   HEADACHE, CHRONIC 07/19/2009   MITRAL VALVE PROLAPSE 07/18/2009   ABDOMINAL PAIN, UNSPECIFIED SITE 03/21/2009   HYPERLIPIDEMIA 03/20/2009   LEUKOCYTOSIS 03/20/2009   HYPERTENSION 03/20/2009   CVA (STROKE) 03/20/2009   DIVERTICULOSIS, COLON 03/20/2009   GI BLEEDING 03/20/2009   PCP:  Claudene Pellet, MD Pharmacy:   [facility] [phone] - [place], [place] - [address] AT [facility] [address] [place] [address] Phone: [phone] Fax: [phone]  Social Drivers of Health (SDOH) Social History: SDOH Screenings   Tobacco Use: Medium Risk (02/08/2024)   Readmission Risk Interventions     No data to display

## 2024-02-09 NOTE — Discharge Summary (Signed)
 [address] [place] [address]             [phone]     Physician Discharge Summary  Patient ID: Hannah Willis MRN: [id number] DOB/AGE: [birth year] 73 y.o.  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   HFrEF (heart failure with reduced ejection fraction) (HCC) 01/20/2024   Severe aortic regurgitation 12/21/2023   Nonrheumatic mitral valve regurgitation 12/21/2023   Exertional chest pain 12/21/2023   S/P right TKA 12/31/2015   S/P knee replacement 12/31/2015   Diarrhea 10/22/2015   Ulcerative colitis (HCC) 10/22/2015   Rectal bleeding 10/22/2015   SPHEROCYTOSIS 07/19/2009   ANEMIA 07/19/2009   GERD 07/19/2009   MEMORY LOSS 07/19/2009   HEADACHE, CHRONIC 07/19/2009   MITRAL VALVE PROLAPSE 07/18/2009   ABDOMINAL PAIN, UNSPECIFIED SITE 03/21/2009   HYPERLIPIDEMIA 03/20/2009   LEUKOCYTOSIS 03/20/2009   HYPERTENSION 03/20/2009   CVA (STROKE) 03/20/2009   DIVERTICULOSIS, COLON 03/20/2009   GI BLEEDING 03/20/2009   Discharge Diagnoses:  Patient Active Problem List   Diagnosis Date Noted   S/P Bentall Procedurewith 25 mm Edwards Konect Bioprosthetic Valve Conduit 02/08/2024   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   HFrEF (heart failure with reduced ejection fraction) (HCC) 01/20/2024   Severe aortic regurgitation 12/21/2023   Nonrheumatic mitral valve regurgitation 12/21/2023   Exertional chest pain 12/21/2023   S/P right TKA 12/31/2015   S/P knee replacement 12/31/2015   Diarrhea 10/22/2015   Ulcerative colitis (HCC) 10/22/2015   Rectal bleeding 10/22/2015   SPHEROCYTOSIS 07/19/2009   ANEMIA 07/19/2009   GERD 07/19/2009   MEMORY LOSS 07/19/2009   HEADACHE, CHRONIC 07/19/2009   MITRAL VALVE PROLAPSE 07/18/2009   ABDOMINAL PAIN, UNSPECIFIED SITE 03/21/2009   HYPERLIPIDEMIA 03/20/2009   LEUKOCYTOSIS 03/20/2009    HYPERTENSION 03/20/2009   CVA (STROKE) 03/20/2009   DIVERTICULOSIS, COLON 03/20/2009   GI BLEEDING 03/20/2009   Discharged Condition: good  History of Present Illness:  Hannah Willis is a 73 year old woman with a history of hypertension, hyperlipidemia, mitral valve prolapse, OSA not on CPAP, and remote stroke who recently presented with progressive exertional shortness of breath and fatigue particularly with walking uphill. She also reports some chest heaviness. She has had lower extremity edema which is improved with Lasix . She has had occasional episodes of dizziness but no syncope. She had a history of mitral valve prolapse which was mildly symptomatic and her last chocardiogram was in 2011. 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 There is mild to moderate mitral valve regurgitation with holosystolic prolapse of the middle segment of the anterior mitral leaflet. The aortic valve was trileaflet with moderate to severe aortic insufficiency with a pressure half-time of 276 ms. The aortic root was noted to be 4.5 cm. Left ventricular ejection fraction was 45 to 50% with global hypokinesis and mild LV dilation to 5.9 cm. A TEE was performed on 12/25/2023 showing ejection fraction of 45 to 50%. The aortic valve was trileaflet with severe central AI with an eccentric course. The aortic root was measured at 4.7 cm. The mitral valve is myxomatous with moderate holosystolic prolapse of the middle segment of the anterior leaflet with mild to moderate mitral valve regurgitation. Cardiac catheterization on the same day showed mild nonobstructive coronary disease with an elevated LVEDP of 30. PA pressure was 30/15 with  a mean of 16. Wedge pressure was 12. Right atrial pressure was 7. CTA of the chest showed the aortic root to measure 5.2 cm. The patient was evaluated by Dr. Lucas who felt surgical intervention would be indicated.  The risks and benefits of the procedure were explained  to the patient and she was agreeable proceed.  Hospital Course:  Hannah Willis presented to [facility] on 02/08/2024.  She was taken to the operating room and underwent Bentall Procedure with a 25 mm Edwards Resilia Konect Bioprosthetic Conduit.  She tolerated the procedure without difficulty and was taken to the SICU in stable condition.  She was extubated the evening of surgery.  She did not require pressor support post operatively.  She was started on Lasix  to facilitate diuresis.  Metolazone  was added for additional diuretic response.  She had expected post operative thrombocytopenia, due to this she was not started on Lovenox for Aspirin .  The patient developed Atrial Fibrillation with RVR.  She was treated with Amiodarone  bolus and drip.  The patient's chest tubes were removed without difficulty.  She was stable for transfer to the progressive care unit on 02/11/2024.  She remained in rate controlled Atrial Fibrillation.  She was started on Eliquis  for stroke prophylaxis/prevention. Her lopressor  was also increased to 75 mg BID to aid in HR control. She had persistent complaints of nausea.  This was treated with anti-emetics. There was a concern this could be related to Amiodarone  use. However she had not moved her bowels since surgery. She was given a dose of lactulose  for mild constipation and her bowels began moving. Her nausea resolved.  She converted to NSR with additional increase of her Lopressor .  She developed hypokalemia which was supplemented. She is ambulating independently.  Her surgical incisions are healing without evidence of infection.  She does have an area of swelling on the right side which is likely hematoma.  She was given instructions on how to monitor.  She is stable for discharge home today.  Consults: None  Significant Diagnostic Studies: radiology:   CT scan:    FINDINGS: Cardiovascular: Aneurysmal dilatation of the aortic root measuring 5.2 cm at the sinuses of  Valsalva. No effacement of the sino-tubular junction. The tubular portion of the ascending thoracic aorta is ectatic but not aneurysmal at 3.8 cm. The transverse and descending thoracic aorta are normal in caliber. Four vessel arch anatomy. The left vertebral artery arises directly from the aorta. Scattered atherosclerotic plaque throughout the aorta and coronary arteries. Mild cardiomegaly with left atrial enlargement and mild left ventricular dilatation. No pericardial effusion.   Mediastinum/Nodes: Unremarkable CT appearance of the thyroid  gland. No suspicious mediastinal or hilar adenopathy. No soft tissue mediastinal mass. The thoracic esophagus is unremarkable.   Lungs/Pleura: No suspicious pulmonary mass or nodule. Mild dependent atelectasis.   Upper Abdomen: No acute abnormality within the upper abdomen. 3-4 mm nonobstructing stone in the lower pole collecting system of the left kidney.   Musculoskeletal: No acute osseous abnormality. Extensive multilevel degenerative disc disease.   Review of the MIP images confirms the above findings.   IMPRESSION: 1. 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 2. No effacement of the sino-tubular junction and mildly ectatic but nonaneurysmal ascending aorta with normal transverse and descending thoracic aorta diameters. 3. Aortic and coronary artery atherosclerotic vascular calcifications. 4. Four vessel arch anatomy. The left vertebral artery arises directly from the aorta. 5. Cardiomegaly with prominent left  atrial enlargement and mild left ventricular dilatation. 6. Nonobstructing left lower pole nephrolithiasis. 7. Multilevel degenerative disc disease.   Aortic Atherosclerosis (ICD10-I70.0); Aortic aneurysm NOS (ICD10-I71.9).     Electronically Signed   By: Wilkie Lent M.D.   On: 01/06/2024 16:06  ECHOCARDIOGRAM:  IMPRESSIONS     1. AI is central with eccentric  course; likely due to dilated aortic  root.   2. Left ventricular ejection fraction, by estimation, is 45 to 50%. The  left ventricle has mildly decreased function. The left ventricle  demonstrates global hypokinesis. The left ventricular internal cavity size  was moderately dilated.   3. Right ventricular systolic function is normal. The right ventricular  size is normal.   4. Left atrial size was severely dilated. No left atrial/left atrial  appendage thrombus was detected.   5. 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 There is moderate holosystolic prolapse of the middle  segment of the anterior leaflet of the mitral valve.   6. The aortic valve is tricuspid. Aortic valve regurgitation is severe.  Aortic valve sclerosis is present, with no evidence of aortic valve  stenosis.   7. Aortic dilatation noted. There is moderate dilatation of the aortic  root, measuring 47 mm. There is Moderate (Grade III) plaque involving the  descending aorta.   8. 3D performed of the mitral valve and 3D performed of the aortic valve.    Treatments: surgery:   02/08/2024   Surgeon:  Dorise LOIS Fellers, MD   First Assistant: Rocky Shad,  PA-C    Preoperative Diagnosis:  5.2 cm aortic root and ascending aortic aneurysm with moderate to severe aortic insufficency    Postoperative Diagnosis:  Same     Procedure:   Median Sternotomy Extracorporeal circulation 3.   Biological Bentall Procedure using a 25 mm Edwards KONECT RESILIA pericardial valved conduit. Reimplantation of left and right coronary arteries   Discharge Exam: Blood pressure (!) 153/79, pulse 76, temperature 98.9 F (37.2 C), temperature source Oral, resp. rate 20, height 5' 2 (1.575 m), weight 63.4 kg, SpO2 95%. General appearance: alert, cooperative, and no distress Neurologic: intact Heart: irregularly irregular rhythm Lungs: clear to auscultation bilaterally Abdomen: soft, non-tender; bowel  sounds normal; no masses,  no organomegaly Extremities: extremities normal, atraumatic, no cyanosis or edema Wound: Clean and dry without sign of infection  Discharge Medications:  The patient has been discharged on:   1.Beta Blocker:  Yes [ X  ]                              No   [   ]                              If No, reason:  2.Ace Inhibitor/ARB: Yes [   ]                                     No  [ X   ]                                     If No, reason: titration of BB  3.Statin:   Yes Galerius.Gant   ]  No  [   ]                  If No, reason:  4.Ecasa:  Yes  [ X  ]                  No   [   ]                  If No, reason:  Patient had ACS upon admission: No  Plavix/P2Y12 inhibitor: Yes [   ]                                      No  [  x ]  Discharge Instructions     Amb Referral to Cardiac Rehabilitation   Complete by: As directed    Diagnosis: Valve Replacement   Valve: Aortic   After initial evaluation and assessments completed: Virtual Based Care may be provided alone or in conjunction with Phase 2 Cardiac Rehab based on patient barriers.: Yes   Intensive Cardiac Rehabilitation (ICR) [facility] location only OR Traditional Cardiac Rehabilitation (TCR) *If criteria for ICR are not met will enroll in TCR ([facility] only): Yes      Allergies as of 02/15/2024       Reactions   Clindamycin Hcl Other (See Comments)   Codeine Nausea And Vomiting   Morphine  And Codeine    Causes hallucinations         Medication List     STOP taking these medications    ciprofloxacin  500 MG tablet Commonly known as: CIPRO    furosemide  20 MG tablet Commonly known as: LASIX    losartan 50 MG tablet Commonly known as: COZAAR   metoprolol  succinate 50 MG 24 hr tablet Commonly known as: TOPROL -XL       TAKE these medications    acetaminophen  500 MG tablet Commonly known as: TYLENOL  Take 1-2 tablets (500-1,000 mg total) by mouth every 6 (six) hours as needed.    alendronate 70 MG tablet Commonly known as: FOSAMAX Take 70 mg by mouth once a week. Take with a full glass of water  on an empty stomach.   amiodarone  200 MG tablet Commonly known as: PACERONE  Take 2 tablets by mouth twice per day for 7 days, then decrease to 1 tablet twice per day for 7 days, then decrease to 1 tablet daily thereafter   aspirin  EC 81 MG tablet Take 1 tablet (81 mg total) by mouth daily. Swallow whole.   calcium  carbonate 1500 (600 Ca) MG Tabs tablet Commonly known as: OSCAL Take by mouth 2 (two) times daily with a meal.   Eliquis  5 MG Tabs tablet Generic drug: apixaban  Take 1 tablet (5 mg total) by mouth 2 (two) times daily.   ENTYVIO  IV Inject 1 Dose as directed every 8 (eight) weeks. Experimental every two months, given at the MD office   ferrous sulfate  325 (65 FE) MG tablet Take 325 mg by mouth daily.   Metoprolol  Tartrate 75 MG Tabs Take 1 tablet (75 mg total) by mouth 2 (two) times daily.   ondansetron  4 MG tablet Commonly known as: Zofran  Take 1 tablet (4 mg total) by mouth every 8 (eight) hours as needed for nausea or vomiting.   PREVAGEN PO Take 1 tablet by mouth daily.   rosuvastatin  20 MG tablet Commonly known as: CRESTOR  Take 20 mg by mouth daily.  thiamine  100 MG tablet Commonly known as: Vitamin B-1 Take 100 mg by mouth daily.   traMADol  50 MG tablet Commonly known as: ULTRAM  Take 1 tablet (50 mg total) by mouth every 6 (six) hours as needed for severe pain (pain score 7-10).   VITAMIN D  PO Take 1 tablet by mouth daily.        Follow-up Information     [facility] Dept. of [facility] [place] Follow up on 02/17/2024.   Specialty: Cardiothoracic Surgery Why: Appoointment is at 11:00 with nurse, For suture removal Contact information: [address] [place]  [address] [phone]        Elmira Newman PARAS, MD Follow up on 03/01/2024.   Specialties: Cardiology,  Radiology Why: Appointment is at 10:00 Contact information: [address] [place] [address] [phone]         Lucas Dorise POUR, MD Follow up on 03/09/2024.   Specialty: Cardiothoracic Surgery Why: Appointment is at 4:00, please get CXR at 3:00 on 2nd floor of our office building prior to your appointment Contact information: [address] [place] [address] [phone]         [facility] at [facility] A Dept. of [place]. [facility] Follow up on 03/23/2024.   Specialty: Cardiology Why: Appointment is at 9:15 Contact information: [address] [place]  [address] [phone]        [facility]  Follow up.   Why: HH referral made per TCTS office- they will contact you to follow up Contact information: [address] [place] [address] [phone]                 Signed:  Rocky Shad, PA-C  02/15/2024, 7:48 AM

## 2024-02-09 NOTE — Progress Notes (Signed)
 Patient ID: Hannah Willis, female   DOB: Nov 25, 1950, 73 y.o.   MRN: [id number]  TCTS Evening Rounds:  Hemodynamically stable in sinus rhythm. Frequent PVC's. Will resume Toprol .   Diuresing well.  CT output low. Will remove in am.  Ambulated twice today.

## 2024-02-09 NOTE — Discharge Instructions (Addendum)
Discharge Instructions:  1. You may shower, please wash incisions daily with soap and water and keep dry.  If you wish to cover wounds with dressing you may do so but please keep clean and change daily.  No tub baths or swimming until incisions have completely healed.  If your incisions become red or develop any drainage please call our office at [phone]  2. No Driving until cleared by Dr. Bartle's office and you are no longer using narcotic pain medications  3. Monitor your weight daily.. Please use the same scale and weigh at same time... If you gain 5-10 lbs in 48 hours with associated lower extremity swelling, please contact our office at [phone]  4. Fever of 101.5 for at least 24 hours with no source, please contact our office at [phone]  5. Activity- up as tolerated, please walk at least 3 times per day.  Avoid strenuous activity, no lifting, pushing, or pulling with your arms over 8-10 lbs for a minimum of 6 weeks  6. If any questions or concerns arise, please do not hesitate to contact our office at [phone]  ____________________________________________________________________________________________________________________________ Information on my medicine - ELIQUIS (apixaban)  This medication education was reviewed with me or my healthcare representative as part of my discharge preparation.  Why was Eliquis prescribed for you? Eliquis was prescribed for you to reduce the risk of a blood clot forming that can cause a stroke if you have a medical condition called atrial fibrillation (a type of irregular heartbeat).  What do You need to know about Eliquis ? Take your Eliquis TWICE DAILY - one tablet in the morning and one tablet in the evening with or without food. If you have difficulty swallowing the tablet whole please discuss with your pharmacist how to take the medication safely.  Take Eliquis exactly as prescribed by your doctor and DO NOT stop taking  Eliquis without talking to the doctor who prescribed the medication.  Stopping may increase your risk of developing a stroke.  Refill your prescription before you run out.  After discharge, you should have regular check-up appointments with your healthcare provider that is prescribing your Eliquis.  In the future your dose may need to be changed if your kidney function or weight changes by a significant amount or as you get older.  What do you do if you miss a dose? If you miss a dose, take it as soon as you remember on the same day and resume taking twice daily.  Do not take more than one dose of ELIQUIS at the same time to make up a missed dose.  Important Safety Information A possible side effect of Eliquis is bleeding. You should call your healthcare provider right away if you experience any of the following: Bleeding from an injury or your nose that does not stop. Unusual colored urine (red or dark brown) or unusual colored stools (red or black). Unusual bruising for unknown reasons. A serious fall or if you hit your head (even if there is no bleeding).  Some medicines may interact with Eliquis and might increase your risk of bleeding or clotting while on Eliquis. To help avoid this, consult your healthcare provider or pharmacist prior to using any new prescription or non-prescription medications, including herbals, vitamins, non-steroidal anti-inflammatory drugs (NSAIDs) and supplements.  This website has more information on Eliquis (apixaban): http://www.eliquis.com/eliquis/home  

## 2024-02-10 ENCOUNTER — Inpatient Hospital Stay (HOSPITAL_COMMUNITY)

## 2024-02-10 LAB — BASIC METABOLIC PANEL WITH GFR
Anion gap: 7 (ref 5–15)
Anion gap: 8 (ref 5–15)
BUN: 16 mg/dL (ref 8–23)
BUN: 14 mg/dL (ref 8–23)
CO2: 24 mmol/L (ref 22–32)
CO2: 24 mmol/L (ref 22–32)
Calcium: 8.1 mg/dL — ABNORMAL LOW (ref 8.9–10.3)
Calcium: 8.3 mg/dL — ABNORMAL LOW (ref 8.9–10.3)
Chloride: 99 mmol/L (ref 98–111)
Chloride: 96 mmol/L — ABNORMAL LOW (ref 98–111)
Creatinine, Ser: 0.61 mg/dL (ref 0.44–1.00)
Creatinine, Ser: 0.7 mg/dL (ref 0.44–1.00)
GFR, Estimated: >60 mL/min (ref >60)
GFR, Estimated: >60 mL/min (ref >60)
Glucose, Bld: 134 mg/dL — ABNORMAL HIGH (ref 70–99)
Glucose, Bld: 181 mg/dL — ABNORMAL HIGH (ref 70–99)
Potassium: 3.8 mmol/L (ref 3.5–5.1)
Potassium: 4 mmol/L (ref 3.5–5.1)
Sodium: 130 mmol/L — ABNORMAL LOW (ref 135–145)
Sodium: 128 mmol/L — ABNORMAL LOW (ref 135–145)

## 2024-02-10 LAB — CBC
HCT: 30 % — ABNORMAL LOW (ref 36.0–46.0)
Hemoglobin: 10.3 g/dL — ABNORMAL LOW (ref 12.0–15.0)
MCH: 32.1 pg (ref 26.0–34.0)
MCHC: 34.3 g/dL (ref 30.0–36.0)
MCV: 93.5 fL (ref 80.0–100.0)
Platelets: 76 K/uL — ABNORMAL LOW (ref 150–400)
RBC: 3.21 MIL/uL — ABNORMAL LOW (ref 3.87–5.11)
RDW: 14.6 % (ref 11.5–15.5)
WBC: 19.7 K/uL — ABNORMAL HIGH (ref 4.0–10.5)
nRBC: 0.2 % (ref 0.0–0.2)

## 2024-02-10 LAB — GLUCOSE, CAPILLARY
Glucose-Capillary: 107 mg/dL — ABNORMAL HIGH (ref 70–99)
Glucose-Capillary: 116 mg/dL — ABNORMAL HIGH (ref 70–99)
Glucose-Capillary: 135 mg/dL — ABNORMAL HIGH (ref 70–99)
Glucose-Capillary: 141 mg/dL — ABNORMAL HIGH (ref 70–99)
Glucose-Capillary: 144 mg/dL — ABNORMAL HIGH (ref 70–99)
Glucose-Capillary: 148 mg/dL — ABNORMAL HIGH (ref 70–99)
Glucose-Capillary: 159 mg/dL — ABNORMAL HIGH (ref 70–99)
Glucose-Capillary: 167 mg/dL — ABNORMAL HIGH (ref 70–99)

## 2024-02-10 LAB — SURGICAL PATHOLOGY

## 2024-02-10 MED ORDER — AMIODARONE IV BOLUS ONLY 150 MG/100ML: 150.0000 mg | Freq: Once | INTRAVENOUS | Status: DC

## 2024-02-10 MED ORDER — INSULIN ASPART 100 UNIT/ML IJ SOLN
0.0000 [IU] | Freq: Three times a day (TID) | INTRAMUSCULAR | Status: DC
  Administered 2024-02-10 – 2024-02-11 (×5): 2 [IU] via SUBCUTANEOUS

## 2024-02-10 MED ORDER — AMIODARONE HCL IN DEXTROSE 360-4.14 MG/200ML-% IV SOLN
30.0000 mg/h | INTRAVENOUS | Status: DC
  Administered 2024-02-10 – 2024-02-11 (×3): 30 mg/h via INTRAVENOUS
  Filled 2024-02-10 (×2): qty 200

## 2024-02-10 MED ORDER — POTASSIUM CHLORIDE CRYS ER 20 MEQ PO TBCR
30.0000 meq | EXTENDED_RELEASE_TABLET | Freq: Three times a day (TID) | ORAL | Status: AC
  Administered 2024-02-10 (×3): 30 meq via ORAL
  Filled 2024-02-10 (×3): qty 1

## 2024-02-10 MED ORDER — METOLAZONE 5 MG PO TABS
5.0000 mg | ORAL_TABLET | Freq: Once | ORAL | Status: AC
  Administered 2024-02-10: 5 mg via ORAL
  Filled 2024-02-10: qty 1

## 2024-02-10 MED ORDER — AMIODARONE HCL IN DEXTROSE 360-4.14 MG/200ML-% IV SOLN
60.0000 mg/h | INTRAVENOUS | Status: AC
  Administered 2024-02-10 (×2): 60 mg/h via INTRAVENOUS
  Filled 2024-02-10 (×3): qty 200

## 2024-02-10 MED ORDER — AMIODARONE LOAD VIA INFUSION
150.0000 mg | Freq: Once | INTRAVENOUS | Status: AC
  Administered 2024-02-10: 150 mg via INTRAVENOUS
  Filled 2024-02-10: qty 83.34

## 2024-02-10 MED ORDER — FUROSEMIDE 10 MG/ML IJ SOLN
40.0000 mg | Freq: Two times a day (BID) | INTRAMUSCULAR | Status: AC
  Administered 2024-02-10 (×2): 40 mg via INTRAVENOUS
  Filled 2024-02-10 (×2): qty 4

## 2024-02-10 NOTE — Plan of Care (Signed)

## 2024-02-10 NOTE — Plan of Care (Signed)
  Problem: Clinical Measurements: Goal: Ability to maintain clinical measurements within normal limits will improve Outcome: Progressing Goal: Will remain free from infection Outcome: Progressing Goal: Diagnostic test results will improve Outcome: Progressing Goal: Respiratory complications will improve Outcome: Progressing Goal: Cardiovascular complication will be avoided Outcome: Progressing   Problem: Coping: Goal: Level of anxiety will decrease Outcome: Progressing   Problem: Elimination: Goal: Will not experience complications related to bowel motility Outcome: Progressing

## 2024-02-10 NOTE — Progress Notes (Signed)
 2 Days Post-Op Procedure(s) (LRB): BENTALL PROCEDURE USING KONECT AORTIC VALVE CONDUIT SIZE (N/A) ECHOCARDIOGRAM, TRANSESOPHAGEAL, INTRAOPERATIVE (N/A) Subjective: No complaints. Sitting up in chair eating breakfast.  Went into atrial fib with RVR early this am and started on IV amio. Still AF but rate low 100's.   Objective: Vital signs in last 24 hours: Temp:  [98.1 F (36.7 C)-98.5 F (36.9 C)] 98.2 F (36.8 C) (08/06 0700) Pulse Rate:  [38-121] 85 (08/06 0700) Cardiac Rhythm: Atrial fibrillation (08/06 0315) Resp:  [13-25] 18 (08/06 0700) BP: (91-137)/(41-91) 119/70 (08/06 0700) SpO2:  [88 %-97 %] 94 % (08/06 0700) Arterial Line BP: (118-134)/(43-64) 134/64 (08/05 1000) Weight:  [71.6 kg] 71.6 kg (08/06 0500)  Hemodynamic parameters for last 24 hours: PAP: (23)/(15) 23/15 CO:  [4.3 L/min] 4.3 L/min CI:  [2.6 L/min/m2] 2.6 L/min/m2  Intake/Output from previous day: 08/05 0701 - 08/06 0700 In: 1949.4 [P.O.:960; I.V.:439.4; IV Piggyback:550.1] Out: 2025 [Urine:1730; Chest Tube:295] Intake/Output this shift: No intake/output data recorded.  General appearance: alert and cooperative Neurologic: intact Heart: irregular rate and rhythm Lungs: clear to auscultation bilaterally Extremities: edema mild Wound: dressing dry  Lab Results: Recent Labs    02/09/24 1618 02/10/24 0502  WBC 20.5* 19.7*  HGB 10.3* 10.3*  HCT 29.1* 30.0*  PLT 64* 76*   BMET:  Recent Labs    02/09/24 1618 02/10/24 0502  NA 133* 130*  K 4.1 3.8  CL 103 99  CO2 23 24  GLUCOSE 159* 134*  BUN 15 16  CREATININE 0.66 0.61  CALCIUM  7.6* 8.1*    PT/INR:  Recent Labs    02/08/24 1312  LABPROT 22.6*  INR 1.9*   ABG    Component Value Date/Time   PHART 7.375 02/08/2024 1831   HCO3 21.4 02/08/2024 1831   TCO2 23 02/08/2024 1831   ACIDBASEDEF 3.0 (H) 02/08/2024 1831   O2SAT 97 02/08/2024 1831   CBG (last 3)  Recent Labs    02/10/24 0009 02/10/24 0313 02/10/24 0647  GLUCAP  148* 135* 167*   CXR: mild left basilar atelectasis  Assessment/Plan: S/P Procedure(s) (LRB): BENTALL PROCEDURE USING KONECT AORTIC VALVE CONDUIT SIZE (N/A) ECHOCARDIOGRAM, TRANSESOPHAGEAL, INTRAOPERATIVE (N/A)  POD 2  Hemodynamically stable in AF on amio. Will continue drip and give another bolus this am. Continue Toprol .  She did not diurese much yesterday with IV lasix  40 bid. Will repeat and give 5 mg metolazone  with it. Continue KCl replacement.  DC chest tubes today.  Keep pacing wires for now.  Glucose still mildly elevated so continue SSI. She had Hgb A1c of 5.4 preop on no meds.  Continue IS, ambulation.   LOS: 2 days    Hannah Willis 02/10/2024

## 2024-02-10 NOTE — Progress Notes (Signed)
      [address]       [place] [address]             [phone]      POD # 2 [place]  Resting in bed  BP 122/73 (BP Location: Right Arm)   Pulse (!) 109   Temp (!) 97.3 F (36.3 C) (Oral)   Resp 17   Ht 5' 2 (1.575 m)   Wt 71.6 kg   SpO2 90%   BMI 28.87 kg/m  In A fib with rate 90-110   Intake/Output Summary (Last 24 hours) at 02/10/2024 1827 Last data filed at 02/10/2024 1600 Gross per 24 hour  Intake 1451.04 ml  Output 1265 ml  Net 186.04 ml    K 4.0 Creatinine 0.70  On amiodarone  for A fib  Ural Acree C. Kerrin, MD [facility] [phone]

## 2024-02-11 ENCOUNTER — Other Ambulatory Visit: Payer: Self-pay

## 2024-02-11 ENCOUNTER — Inpatient Hospital Stay (HOSPITAL_COMMUNITY)

## 2024-02-11 DIAGNOSIS — K51319 Ulcerative (chronic) rectosigmoiditis with unspecified complications: Secondary | ICD-10-CM

## 2024-02-11 LAB — CBC
HCT: 28.1 % — ABNORMAL LOW (ref 36.0–46.0)
Hemoglobin: 9.8 g/dL — ABNORMAL LOW (ref 12.0–15.0)
MCH: 32 pg (ref 26.0–34.0)
MCHC: 34.9 g/dL (ref 30.0–36.0)
MCV: 91.8 fL (ref 80.0–100.0)
Platelets: 97 K/uL — ABNORMAL LOW (ref 150–400)
RBC: 3.06 MIL/uL — ABNORMAL LOW (ref 3.87–5.11)
RDW: 14.3 % (ref 11.5–15.5)
WBC: 17.3 K/uL — ABNORMAL HIGH (ref 4.0–10.5)
nRBC: 0.2 % (ref 0.0–0.2)

## 2024-02-11 LAB — BASIC METABOLIC PANEL WITH GFR
Anion gap: 7 (ref 5–15)
BUN: 12 mg/dL (ref 8–23)
CO2: 28 mmol/L (ref 22–32)
Calcium: 9.4 mg/dL (ref 8.9–10.3)
Chloride: 94 mmol/L — ABNORMAL LOW (ref 98–111)
Creatinine, Ser: 0.62 mg/dL (ref 0.44–1.00)
GFR, Estimated: >60 mL/min (ref >60)
Glucose, Bld: 129 mg/dL — ABNORMAL HIGH (ref 70–99)
Potassium: 4.2 mmol/L (ref 3.5–5.1)
Sodium: 129 mmol/L — ABNORMAL LOW (ref 135–145)

## 2024-02-11 LAB — GLUCOSE, CAPILLARY
Glucose-Capillary: 151 mg/dL — ABNORMAL HIGH (ref 70–99)
Glucose-Capillary: 152 mg/dL — ABNORMAL HIGH (ref 70–99)
Glucose-Capillary: 134 mg/dL — ABNORMAL HIGH (ref 70–99)

## 2024-02-11 MED ORDER — SODIUM CHLORIDE 0.9 % IV SOLN
250.0000 mL | INTRAVENOUS | Status: AC | PRN
Start: 2024-02-11 — End: 2024-02-12

## 2024-02-11 MED ORDER — FENTANYL CITRATE PF 50 MCG/ML IJ SOSY: 25.0000 ug | PREFILLED_SYRINGE | Freq: Once | INTRAMUSCULAR | Status: DC

## 2024-02-11 MED ORDER — SODIUM CHLORIDE 0.9% FLUSH
3.0000 mL | Freq: Two times a day (BID) | INTRAVENOUS | Status: DC
  Administered 2024-02-11 – 2024-02-14 (×8): 3 mL via INTRAVENOUS

## 2024-02-11 MED ORDER — SODIUM CHLORIDE 0.9% FLUSH: 3.0000 mL | INTRAVENOUS | Status: DC | PRN

## 2024-02-11 MED ORDER — POTASSIUM CHLORIDE CRYS ER 20 MEQ PO TBCR
20.0000 meq | EXTENDED_RELEASE_TABLET | Freq: Two times a day (BID) | ORAL | Status: AC
  Administered 2024-02-11 – 2024-02-13 (×6): 20 meq via ORAL
  Filled 2024-02-11 (×6): qty 1

## 2024-02-11 MED ORDER — METOPROLOL SUCCINATE ER 50 MG PO TB24
50.0000 mg | ORAL_TABLET | Freq: Every day | ORAL | Status: DC
  Administered 2024-02-11: 50 mg via ORAL
  Filled 2024-02-11: qty 1

## 2024-02-11 MED ORDER — ASPIRIN 325 MG PO TBEC
325.0000 mg | DELAYED_RELEASE_TABLET | Freq: Every day | ORAL | Status: DC
  Administered 2024-02-11: 325 mg via ORAL
  Filled 2024-02-11: qty 1

## 2024-02-11 MED ORDER — TORSEMIDE 20 MG PO TABS
40.0000 mg | ORAL_TABLET | Freq: Every day | ORAL | Status: AC
  Administered 2024-02-12 – 2024-02-13 (×2): 40 mg via ORAL
  Filled 2024-02-11 (×2): qty 2

## 2024-02-11 MED ORDER — ASPIRIN 81 MG PO TBEC
81.0000 mg | DELAYED_RELEASE_TABLET | Freq: Every day | ORAL | Status: DC
  Administered 2024-02-12 – 2024-02-15 (×5): 81 mg via ORAL
  Filled 2024-02-11 (×4): qty 1

## 2024-02-11 MED ORDER — ~~LOC~~ CARDIAC SURGERY, PATIENT & FAMILY EDUCATION
Freq: Once | Status: AC
  Filled 2024-02-11: qty 1

## 2024-02-11 MED ORDER — AMIODARONE HCL 200 MG PO TABS
400.0000 mg | ORAL_TABLET | Freq: Two times a day (BID) | ORAL | Status: DC
  Administered 2024-02-11 – 2024-02-15 (×10): 400 mg via ORAL
  Filled 2024-02-11 (×9): qty 2

## 2024-02-11 MED ORDER — AMIODARONE IV BOLUS ONLY 150 MG/100ML
150.0000 mg | Freq: Once | INTRAVENOUS | Status: AC
  Administered 2024-02-11: 150 mg via INTRAVENOUS
  Filled 2024-02-11: qty 100

## 2024-02-11 MED ORDER — METOPROLOL TARTRATE 50 MG PO TABS
50.0000 mg | ORAL_TABLET | Freq: Two times a day (BID) | ORAL | Status: DC
  Administered 2024-02-11 – 2024-02-12 (×3): 50 mg via ORAL
  Filled 2024-02-11 (×3): qty 1

## 2024-02-11 MED ORDER — AMIODARONE IV BOLUS ONLY 150 MG/100ML
150.0000 mg | Freq: Once | INTRAVENOUS | Status: AC
  Administered 2024-02-11: 150 mg via INTRAVENOUS

## 2024-02-11 MED ORDER — APIXABAN 5 MG PO TABS
5.0000 mg | ORAL_TABLET | Freq: Two times a day (BID) | ORAL | Status: DC
  Administered 2024-02-11 – 2024-02-15 (×9): 5 mg via ORAL
  Filled 2024-02-11 (×8): qty 1

## 2024-02-11 NOTE — Progress Notes (Signed)
 Patient arrived to 4E via wheelchair. AO x4, connected to tele and CCMD notified. Pt had received bath and CHG right before transfer in 2H. Denies nay pain, Afib 110, 120's when moving. Had received Amio iv bolus and 400 PO this morning . Transferring RN said she will notify DR. Bartle when he is out of surgery. Call bell within reach, oriented patient to room and call bell system. Plan of care continues.

## 2024-02-11 NOTE — Progress Notes (Signed)
 3 Days Post-Op Procedure(s) (LRB): BENTALL PROCEDURE USING KONECT AORTIC VALVE CONDUIT SIZE (N/A) ECHOCARDIOGRAM, TRANSESOPHAGEAL, INTRAOPERATIVE (N/A) Subjective: No complaints  Ambulated this am without difficulty. Had remained in atrial fib on IV amio.  Objective: Vital signs in last 24 hours: Temp:  [97.3 F (36.3 C)-98.7 F (37.1 C)] 97.9 F (36.6 C) (08/07 0700) Pulse Rate:  [77-129] 106 (08/07 0700) Cardiac Rhythm: Atrial fibrillation (08/07 0000) Resp:  [15-25] 19 (08/07 0700) BP: (87-137)/(45-102) 107/83 (08/07 0700) SpO2:  [81 %-97 %] 89 % (08/07 0700) Weight:  [69.9 kg] 69.9 kg (08/07 0600)  Hemodynamic parameters for last 24 hours:    Intake/Output from previous day: 08/06 0701 - 08/07 0700 In: 912.7 [P.O.:390; I.V.:522.7] Out: 3325 [Urine:3295; Chest Tube:30] Intake/Output this shift: No intake/output data recorded.  General appearance: alert and cooperative Neurologic: intact Heart: irregularly irregular rhythm Lungs: clear to auscultation bilaterally Extremities: no edema Wound: dressing dry  Lab Results: Recent Labs    02/10/24 0502 02/11/24 0516  WBC 19.7* 17.3*  HGB 10.3* 9.8*  HCT 30.0* 28.1*  PLT 76* 97*   BMET:  Recent Labs    02/10/24 1715 02/11/24 0516  NA 128* 129*  K 4.0 4.2  CL 96* 94*  CO2 24 28  GLUCOSE 181* 129*  BUN 14 12  CREATININE 0.70 0.62  CALCIUM  8.3* 9.4    PT/INR:  Recent Labs    02/08/24 1312  LABPROT 22.6*  INR 1.9*   ABG    Component Value Date/Time   PHART 7.375 02/08/2024 1831   HCO3 21.4 02/08/2024 1831   TCO2 23 02/08/2024 1831   ACIDBASEDEF 3.0 (H) 02/08/2024 1831   O2SAT 97 02/08/2024 1831   CBG (last 3)  Recent Labs    02/10/24 1937 02/10/24 2318 02/11/24 0636  GLUCAP 141* 116* 151*   CXR: clear  Assessment/Plan: S/P Procedure(s) (LRB): BENTALL PROCEDURE USING KONECT AORTIC VALVE CONDUIT SIZE (N/A) ECHOCARDIOGRAM, TRANSESOPHAGEAL, INTRAOPERATIVE (N/A)  POD  3  Hemodynamically stable in atrial fibrillation on IV amio. Rate is reasonably controlled. Will give another bolus this am and switch to 400 bid po. Increase Toprol  to 50 daily. Will remove pacing wires and plan to start Eliquis  tonight is she does not convert.  -2400 cc yesterday and still diuresing well. Wt down 4 lbs from yesterday but still 11 lbs over preop if accurate. Continue diuresis.  Glucose has been as high as 151 with no hx of DM and normal preop Hgb A1c. Will continue CBG's and SSI for now.  She can transfer to 4E. Continue IS, ambulation.   LOS: 3 days    Hannah Willis 02/11/2024

## 2024-02-11 NOTE — Plan of Care (Signed)

## 2024-02-11 NOTE — Progress Notes (Signed)
 DR. Caryn was by bedside, notified MD that pt HR is staying ay 90-110's while resting and goes up to 130's-140's when ambulating. See MAR for new orders. Reported to night shift.

## 2024-02-12 ENCOUNTER — Encounter: Payer: Self-pay | Admitting: Gastroenterology

## 2024-02-12 ENCOUNTER — Other Ambulatory Visit (HOSPITAL_COMMUNITY): Payer: Self-pay

## 2024-02-12 LAB — BPAM RBC
Blood Product Expiration Date: 202508292359
Blood Product Expiration Date: 202508292359
Blood Product Expiration Date: 202508292359
Blood Product Expiration Date: 202508292359
Blood Product Expiration Date: 202508292359
Blood Product Expiration Date: 202508292359
ISSUE DATE / TIME: 202508040846
ISSUE DATE / TIME: 202508040846
ISSUE DATE / TIME: 202508040846
ISSUE DATE / TIME: 202508040846
Unit Type and Rh: 5100
Unit Type and Rh: 5100
Unit Type and Rh: 5100
Unit Type and Rh: 5100
Unit Type and Rh: 5100
Unit Type and Rh: 5100

## 2024-02-12 LAB — TYPE AND SCREEN
ABO/RH(D): O POS
Antibody Screen: NEGATIVE
Unit division: 0
Unit division: 0
Unit division: 0
Unit division: 0
Unit division: 0
Unit division: 0

## 2024-02-12 LAB — BASIC METABOLIC PANEL WITH GFR
Anion gap: 9 (ref 5–15)
BUN: 14 mg/dL (ref 8–23)
CO2: 29 mmol/L (ref 22–32)
Calcium: 10.5 mg/dL — ABNORMAL HIGH (ref 8.9–10.3)
Chloride: 90 mmol/L — ABNORMAL LOW (ref 98–111)
Creatinine, Ser: 0.77 mg/dL (ref 0.44–1.00)
GFR, Estimated: >60 mL/min (ref >60)
Glucose, Bld: 109 mg/dL — ABNORMAL HIGH (ref 70–99)
Potassium: 4.1 mmol/L (ref 3.5–5.1)
Sodium: 128 mmol/L — ABNORMAL LOW (ref 135–145)

## 2024-02-12 LAB — GLUCOSE, CAPILLARY
Glucose-Capillary: 110 mg/dL — ABNORMAL HIGH (ref 70–99)
Glucose-Capillary: 128 mg/dL — ABNORMAL HIGH (ref 70–99)

## 2024-02-12 MED ORDER — LACTULOSE 10 GM/15ML PO SOLN
20.0000 g | Freq: Once | ORAL | Status: AC
  Administered 2024-02-12: 20 g via ORAL
  Filled 2024-02-12: qty 30

## 2024-02-12 MED ORDER — METOCLOPRAMIDE HCL 5 MG/ML IJ SOLN
10.0000 mg | Freq: Four times a day (QID) | INTRAMUSCULAR | Status: AC
  Administered 2024-02-12 (×4): 10 mg via INTRAVENOUS
  Filled 2024-02-12 (×4): qty 2

## 2024-02-12 MED FILL — Lidocaine HCl Local Preservative Free (PF) Inj 2%: INTRAMUSCULAR | Qty: 14 | Status: AC

## 2024-02-12 MED FILL — Heparin Sodium (Porcine) Inj 1000 Unit/ML: Qty: 1000 | Status: AC

## 2024-02-12 MED FILL — Heparin Sodium (Porcine) Inj 1000 Unit/ML: INTRAMUSCULAR | Qty: 2500 | Status: AC

## 2024-02-12 MED FILL — Potassium Chloride Inj 2 mEq/ML: INTRAVENOUS | Qty: 40 | Status: AC

## 2024-02-12 NOTE — TOC Benefit Eligibility Note (Signed)
 Pharmacy Patient Advocate Encounter  Insurance verification completed.    The patient is insured through  [place] Part D . Patient has Medicare and is not eligible for a copay card, but may be able to apply for patient assistance or Medicare RX Payment Plan (Patient Must reach out to their plan, if eligible for payment plan), if available.    Ran test claim for Eliquis  5mg  and the current 30 day co-pay is $47.   This test claim was processed through [facility]- copay amounts may vary at other pharmacies due to [employer], or as the patient moves through the different stages of their insurance plan.

## 2024-02-12 NOTE — Progress Notes (Signed)
 CARDIAC REHAB PHASE I   PRE:  Rate/Rhythm: 99 afib    BP: lying 130/95    SpO2: 98 RA  MODE:  Ambulation: 135 ft   POST:  Rate/Rhythm: 125 afib    BP: sitting 127/97 in BR, 126/76 after walking to recliner     SpO2: 95 RA  Pt sat up independently from bed, stood with min assist and verbal reminders for sternal precautions. Requested to use RW. Steady, standby assist in hall. At 135 ft pt c/o dizziness. Sat and rested however dizziness persisted. After 5 min pt rolled back to room.   To BR per request. No BR results, BP stable in BR. To recliner. Began ed, after 10 min pt needed to return to BR. Left pt in BR. Afib maintained 100-120s. SpO2 in recliner 90-93 RA when relaxed. Could check SpO2 in hall later to ensure stable.  Discussed IS (currently 1000 ml), sternal precautions, and CRPII. Encouraged pt to walk at home and gave guidelines written out. She also likes to ride her stationary bike and she can return to it but not use arms. Will refer to [facility] CRPII.    0900-1002  Aliene Aris BS, ACSM-CEP 02/12/2024 9:51 AM

## 2024-02-12 NOTE — Progress Notes (Signed)
      [address] [place] [place] [address]             [phone]      4 Days Post-Op Procedure(s) (LRB): BENTALL PROCEDURE USING KONECT AORTIC VALVE CONDUIT SIZE (N/A) ECHOCARDIOGRAM, TRANSESOPHAGEAL, INTRAOPERATIVE (N/A)  Subjective:  Patient looks great.  Has some complaints of nausea.  She has not yet thrown up.  She is passing gas, but has not been able to move her bowels.  Objective: Vital signs in last 24 hours: Temp:  [98 F (36.7 C)-99 F (37.2 C)] 98.2 F (36.8 C) (08/08 0329) Pulse Rate:  [66-111] 92 (08/08 0329) Cardiac Rhythm: Atrial fibrillation (08/08 0330) Resp:  [15-23] 23 (08/08 0539) BP: (111-154)/(77-97) 154/94 (08/08 0329) SpO2:  [89 %-98 %] 94 % (08/08 0329) Weight:  [70.3 kg] 70.3 kg (08/08 0539)  Intake/Output from previous day: 08/07 0701 - 08/08 0700 In: 560 [P.O.:560] Out: 180 [Urine:180]  General appearance: alert, cooperative, and no distress Heart: irregularly irregular rhythm Lungs: clear to auscultation bilaterally Abdomen: soft, non-tender; bowel sounds normal; no masses,  no organomegaly Extremities: edema trace Wound: clean and dry  Lab Results: Recent Labs    02/10/24 0502 02/11/24 0516  WBC 19.7* 17.3*  HGB 10.3* 9.8*  HCT 30.0* 28.1*  PLT 76* 97*   BMET:  Recent Labs    02/11/24 0516 02/12/24 0325  NA 129* 128*  K 4.2 4.1  CL 94* 90*  CO2 28 29  GLUCOSE 129* 109*  BUN 12 14  CREATININE 0.62 0.77  CALCIUM  9.4 10.5*    PT/INR: No results for input(s): LABPROT, INR in the last 72 hours. ABG    Component Value Date/Time   PHART 7.375 02/08/2024 1831   HCO3 21.4 02/08/2024 1831   TCO2 23 02/08/2024 1831   ACIDBASEDEF 3.0 (H) 02/08/2024 1831   O2SAT 97 02/08/2024 1831   CBG (last 3)  Recent Labs    02/11/24 1103 02/11/24 1746 02/12/24 0537  GLUCAP 152* 134* 110*    Assessment/Plan: S/P Procedure(s) (LRB): BENTALL PROCEDURE USING KONECT AORTIC VALVE CONDUIT SIZE  (N/A) ECHOCARDIOGRAM, TRANSESOPHAGEAL, INTRAOPERATIVE (N/A)  CV- Atrial Fibrillation, rate is controlled- continue Amiodarone  400 mg BID for now, may have to decrease due to nausea..Lopressor  increased to 50 mg BID Pulm- no acute issues, not requiring oxygen Renal- creatinine has been stable, K is at 4.0.. weight is trending down.. continue Demadex , potassium GI- mild constipation.. persistent nausea.. will give Lactulose  today, add reglan  x 4 doses... if nausea doesn't resolve after bowels move.. most likely related to Amiodarone  CBGs stable, patient is not a diabetic, will d/c SSIP Dispo- patient doing well, remains in rate controlled A. Fib.SABRA titrating Lopressor  today, lactulose /reglan  for bowels.... likely ready for d/c on Sunday depending HR control   LOS: 4 days    Rocky Shad, PA-C 02/12/2024

## 2024-02-12 NOTE — Progress Notes (Signed)
 Mobility Specialist Progress Note:    02/12/24 1028  Mobility  Activity Ambulated with assistance  Level of Assistance Modified independent, requires aide device or extra time  Assistive Device None  Distance Ambulated (ft) 25 ft  RUE Weight Bearing Per Provider Order NWB  LUE Weight Bearing Per Provider Order NWB  Activity Response Tolerated well  Mobility Referral Yes  Mobility visit 1 Mobility  Mobility Specialist Start Time (ACUTE ONLY) 1028  Mobility Specialist Stop Time (ACUTE ONLY) 1038  Mobility Specialist Time Calculation (min) (ACUTE ONLY) 10 min   Pt received exiting bathroom. Agreeable to mobility session. Sat in chair while MS took BP: 103/56. Pt requested to use bathroom once again prior to session. Void successful, however, pt reported feeling dizzy again. Sitting EOB, BP 103/79. Assisted pt to supine, lying comfortably in bed, all needs met, BP 122/65 (81). Pt declined further mobility at this time.    Casandra Dallaire Mobility Specialist Please contact via Special educational needs teacher or  Rehab office at [phone]

## 2024-02-12 NOTE — Progress Notes (Signed)
 4 Days Post-Op Procedure(s) (LRB): BENTALL PROCEDURE USING KONECT AORTIC VALVE CONDUIT SIZE (N/A) ECHOCARDIOGRAM, TRANSESOPHAGEAL, INTRAOPERATIVE (N/A) Subjective:  She has had some persistent nausea since surgery. It is not terrible but still there. Unclear if this is related to amio or not. Has not moved bowels much since surgery.  Objective: Vital signs in last 24 hours: Temp:  [98 F (36.7 C)-99 F (37.2 C)] 98.2 F (36.8 C) (08/08 0329) Pulse Rate:  [66-111] 92 (08/08 0329) Cardiac Rhythm: Atrial fibrillation (08/08 0330) Resp:  [15-23] 23 (08/08 0539) BP: (111-154)/(77-97) 154/94 (08/08 0329) SpO2:  [89 %-98 %] 94 % (08/08 0329) Weight:  [70.3 kg] 70.3 kg (08/08 0539)  Hemodynamic parameters for last 24 hours:    Intake/Output from previous day: 08/07 0701 - 08/08 0700 In: 560 [P.O.:560] Out: 180 [Urine:180] Intake/Output this shift: No intake/output data recorded.  General appearance: alert and cooperative Neurologic: intact Heart: irregularly irregular rhythm Lungs: clear to auscultation bilaterally Extremities: no edema Wound: incision healing well  Lab Results: Recent Labs    02/10/24 0502 02/11/24 0516  WBC 19.7* 17.3*  HGB 10.3* 9.8*  HCT 30.0* 28.1*  PLT 76* 97*   BMET:  Recent Labs    02/11/24 0516 02/12/24 0325  NA 129* 128*  K 4.2 4.1  CL 94* 90*  CO2 28 29  GLUCOSE 129* 109*  BUN 12 14  CREATININE 0.62 0.77  CALCIUM  9.4 10.5*    PT/INR: No results for input(s): LABPROT, INR in the last 72 hours. ABG    Component Value Date/Time   PHART 7.375 02/08/2024 1831   HCO3 21.4 02/08/2024 1831   TCO2 23 02/08/2024 1831   ACIDBASEDEF 3.0 (H) 02/08/2024 1831   O2SAT 97 02/08/2024 1831   CBG (last 3)  Recent Labs    02/11/24 1103 02/11/24 1746 02/12/24 0537  GLUCAP 152* 134* 110*    Assessment/Plan: S/P Procedure(s) (LRB): BENTALL PROCEDURE USING KONECT AORTIC VALVE CONDUIT SIZE (N/A) ECHOCARDIOGRAM,  TRANSESOPHAGEAL, INTRAOPERATIVE (N/A)  POD 4  Hemodynamically stable in atrial fib. Rate control is better since Lopressor  increased to 50 bid but has not converted on oral amio with IV boluses. Started on Eliquis  last night. As long as her rate is controlled she can go home in AF and if she does not convert on her own then she could be cardioverted.   Not clear if nausea related to amio, pain meds, constipation. Will give lactulose  and reglan  today.  Wt is up 1 lb today from yesterday. 12 lbs over preop wt if accurate. Continue diuresis and KCL.    LOS: 4 days    Dorise MARLA Fellers 02/12/2024

## 2024-02-13 ENCOUNTER — Encounter: Payer: Self-pay | Admitting: Gastroenterology

## 2024-02-13 ENCOUNTER — Other Ambulatory Visit (HOSPITAL_COMMUNITY): Payer: Self-pay

## 2024-02-13 MED ORDER — METOPROLOL TARTRATE 75 MG PO TABS
75.0000 mg | ORAL_TABLET | Freq: Two times a day (BID) | ORAL | 1 refills | Status: DC
  Filled 2024-02-13: qty 60, 30d supply, fill #0

## 2024-02-13 MED ORDER — APIXABAN 5 MG PO TABS
5.0000 mg | ORAL_TABLET | Freq: Two times a day (BID) | ORAL | 3 refills | Status: DC
  Filled 2024-02-13: qty 60, 30d supply, fill #0

## 2024-02-13 MED ORDER — AMIODARONE HCL 200 MG PO TABS
ORAL_TABLET | ORAL | 1 refills | Status: DC
  Filled 2024-02-13: qty 60, 32d supply, fill #0

## 2024-02-13 MED ORDER — METOPROLOL TARTRATE 50 MG PO TABS
75.0000 mg | ORAL_TABLET | Freq: Two times a day (BID) | ORAL | Status: DC
  Administered 2024-02-13 – 2024-02-15 (×6): 75 mg via ORAL
  Filled 2024-02-13 (×5): qty 1

## 2024-02-13 MED ORDER — POTASSIUM CHLORIDE CRYS ER 20 MEQ PO TBCR
10.0000 meq | EXTENDED_RELEASE_TABLET | Freq: Every day | ORAL | 1 refills | Status: DC
  Filled 2024-02-13: qty 30, 60d supply, fill #0

## 2024-02-13 MED ORDER — TRAMADOL HCL 50 MG PO TABS
50.0000 mg | ORAL_TABLET | Freq: Four times a day (QID) | ORAL | 0 refills | Status: AC | PRN
  Filled 2024-02-13: qty 28, 7d supply, fill #0

## 2024-02-13 MED ORDER — ONDANSETRON HCL 4 MG PO TABS
4.0000 mg | ORAL_TABLET | Freq: Three times a day (TID) | ORAL | 1 refills | Status: AC | PRN
  Filled 2024-02-13: qty 90, 30d supply, fill #0

## 2024-02-13 MED ORDER — ACETAMINOPHEN 500 MG PO TABS: 500.0000 mg | ORAL_TABLET | Freq: Four times a day (QID) | ORAL | Status: AC | PRN

## 2024-02-13 NOTE — Progress Notes (Addendum)
 [address] [place] [place] [address]             [phone]      5 Days Post-Op Procedure(s) (LRB): BENTALL PROCEDURE USING KONECT AORTIC VALVE CONDUIT SIZE (N/A) ECHOCARDIOGRAM, TRANSESOPHAGEAL, INTRAOPERATIVE (N/A) Subjective: Patient returning from the restroom when entering the room. Patient reports she feels much better today and she denies nausea.   Objective: Vital signs in last 24 hours: Temp:  [97.8 F (36.6 C)-98.3 F (36.8 C)] 98 F (36.7 C) (08/09 0716) Pulse Rate:  [83-99] 99 (08/09 0716) Cardiac Rhythm: Atrial fibrillation (08/08 2000) Resp:  [14-20] 20 (08/09 0716) BP: (102-129)/(47-86) 110/81 (08/09 0716) SpO2:  [90 %-97 %] 94 % (08/09 0716) Weight:  [65.5 kg] 65.5 kg (08/09 0519)  Hemodynamic parameters for last 24 hours:    Intake/Output from previous day: 08/08 0701 - 08/09 0700 In: 120 [P.O.:120] Out: 4 [Stool:4] Intake/Output this shift: No intake/output data recorded.  General appearance: alert, cooperative, and no distress Neurologic: intact Heart: irregularly irregular rhythm Lungs: slightly diminished left basilar breath sounds Abdomen: soft, non-tender; bowel sounds normal; no masses,  no organomegaly Extremities: extremities normal, atraumatic, no cyanosis or edema Wound: Clean and dry without sign of infection  Lab Results: Recent Labs    02/11/24 0516  WBC 17.3*  HGB 9.8*  HCT 28.1*  PLT 97*   BMET:  Recent Labs    02/11/24 0516 02/12/24 0325  NA 129* 128*  K 4.2 4.1  CL 94* 90*  CO2 28 29  GLUCOSE 129* 109*  BUN 12 14  CREATININE 0.62 0.77  CALCIUM  9.4 10.5*    PT/INR: No results for input(s): LABPROT, INR in the last 72 hours. ABG    Component Value Date/Time   PHART 7.375 02/08/2024 1831   HCO3 21.4 02/08/2024 1831   TCO2 23 02/08/2024 1831   ACIDBASEDEF 3.0 (H) 02/08/2024 1831   O2SAT 97 02/08/2024 1831   CBG (last 3)  Recent Labs    02/11/24 1746 02/12/24 0537  02/12/24 1136  GLUCAP 134* 110* 128*    Assessment/Plan: S/P Procedure(s) (LRB): BENTALL PROCEDURE USING KONECT AORTIC VALVE CONDUIT SIZE (N/A) ECHOCARDIOGRAM, TRANSESOPHAGEAL, INTRAOPERATIVE (N/A)  Neuro: Pain controlled  CV: Atrial fibrillation overall rate has been controlled, rates mostly 80s-100s at rest but up to the 120s with ambulation but rates up into the 130s this morning while sitting on the edge of the bed after returning from the restroom. SBP mostly 100-110s, reports it is higher on the left arm compared to the right. On Amiodarone  400mg  BID, Lopressor  50mg  BID and Eliquis  5mg  daily. K 4.1 yesterday, at goal. Will try to titrate Lopressor  to 75mg  BID.    Pulm: Saturating well on RA. Last CXR with small left pleural effusion and left basilar atelectasis. Encourage IS and ambulation.   GI: Persistent nausea resolved. +BM yesterday. Tolerating a diet  Endo: No hx of DM, SSI has been d/c'd. Mild hyponatremia has remained stable.   Renal: Cr 0.77, stable. No UO recorded. +1lb from preop weight. Continue Demadex  40mg  today and potassium supplement.   ID: Likely reactive leukocytosis, last WBC 17.3 trending down from 19.7. Clinically monitor.  Expected postop ABLA: Last H/H 9.8/28.1, stable. Not clinically significant at this time. Continue iron supplement.   DVT Prophylaxis: No Lovenox, on Eliquis   Dispo: Hopefully can improve heart rate control today and can d/c home tomorrow    LOS: 5 days  Con GORMAN [place], PA-C 02/13/2024  Agree  Doing well Dispo planning  Hannah Willis

## 2024-02-13 NOTE — Progress Notes (Signed)
 Mobility Specialist Progress Note;   02/13/24 0924  Mobility  Activity Ambulated with assistance  Level of Assistance Contact guard assist, steadying assist  Assistive Device Front wheel walker  Distance Ambulated (ft) 200 ft  RUE Weight Bearing Per Provider Order NWB  LUE Weight Bearing Per Provider Order NWB  Activity Response Tolerated well  Mobility Referral Yes  Mobility visit 1 Mobility  Mobility Specialist Start Time (ACUTE ONLY) [id number]  Mobility Specialist Stop Time (ACUTE ONLY) [id number]  Mobility Specialist Time Calculation (min) (ACUTE ONLY) 13 min    Pre-mobility: Supine BP 121/85 (96)  Pt agreeable to mobility. Requested to use BR at [facility]. Stated she was feeling much better today. Required light MinG assistance during ambulation for safety. No c/o dizziness when asked. Pt returned back to bed and left with all needs met, call bell in reach.   Lauraine Erm Mobility Specialist Please contact via [employer] or [employer] [phone]

## 2024-02-13 NOTE — Progress Notes (Signed)
 CARDIAC REHAB PHASE I   Pt ambulating independently. Tolerating well. Postop [facility] education completed, no questions or concerns. Referral for CRP2 sent to [facility].   Vaughn Asberry Hacking, RN BSN 02/13/2024 10:07 AM

## 2024-02-13 NOTE — Progress Notes (Signed)
 During this shift pt hr AFIB 80-100's while resting 120's-140's with ambulation

## 2024-02-14 ENCOUNTER — Inpatient Hospital Stay (HOSPITAL_COMMUNITY)

## 2024-02-14 LAB — BASIC METABOLIC PANEL WITH GFR
Anion gap: 16 — ABNORMAL HIGH (ref 5–15)
Anion gap: 16 — ABNORMAL HIGH (ref 5–15)
Anion gap: 16 — ABNORMAL HIGH (ref 5–15)
BUN: 17 mg/dL (ref 8–23)
BUN: 17 mg/dL (ref 8–23)
BUN: 16 mg/dL (ref 8–23)
CO2: 34 mmol/L — ABNORMAL HIGH (ref 22–32)
CO2: 35 mmol/L — ABNORMAL HIGH (ref 22–32)
CO2: 33 mmol/L — ABNORMAL HIGH (ref 22–32)
Calcium: 10.3 mg/dL (ref 8.9–10.3)
Calcium: 9.9 mg/dL (ref 8.9–10.3)
Calcium: 9.4 mg/dL (ref 8.9–10.3)
Chloride: 83 mmol/L — ABNORMAL LOW (ref 98–111)
Chloride: 81 mmol/L — ABNORMAL LOW (ref 98–111)
Chloride: 80 mmol/L — ABNORMAL LOW (ref 98–111)
Creatinine, Ser: 1.08 mg/dL — ABNORMAL HIGH (ref 0.44–1.00)
Creatinine, Ser: 1.03 mg/dL — ABNORMAL HIGH (ref 0.44–1.00)
Creatinine, Ser: 0.98 mg/dL (ref 0.44–1.00)
GFR, Estimated: 54 mL/min — ABNORMAL LOW (ref >60)
GFR, Estimated: 57 mL/min — ABNORMAL LOW (ref >60)
GFR, Estimated: >60 mL/min (ref >60)
Glucose, Bld: 108 mg/dL — ABNORMAL HIGH (ref 70–99)
Glucose, Bld: 113 mg/dL — ABNORMAL HIGH (ref 70–99)
Glucose, Bld: 123 mg/dL — ABNORMAL HIGH (ref 70–99)
Potassium: 2.5 mmol/L — CL (ref 3.5–5.1)
Potassium: 2.8 mmol/L — ABNORMAL LOW (ref 3.5–5.1)
Potassium: 3.1 mmol/L — ABNORMAL LOW (ref 3.5–5.1)
Sodium: 133 mmol/L — ABNORMAL LOW (ref 135–145)
Sodium: 132 mmol/L — ABNORMAL LOW (ref 135–145)
Sodium: 129 mmol/L — ABNORMAL LOW (ref 135–145)

## 2024-02-14 LAB — CBC
HCT: 33.9 % — ABNORMAL LOW (ref 36.0–46.0)
Hemoglobin: 12 g/dL (ref 12.0–15.0)
MCH: 32.9 pg (ref 26.0–34.0)
MCHC: 35.4 g/dL (ref 30.0–36.0)
MCV: 92.9 fL (ref 80.0–100.0)
Platelets: 303 K/uL (ref 150–400)
RBC: 3.65 MIL/uL — ABNORMAL LOW (ref 3.87–5.11)
RDW: 14.4 % (ref 11.5–15.5)
WBC: 13.3 K/uL — ABNORMAL HIGH (ref 4.0–10.5)
nRBC: 0.2 % (ref 0.0–0.2)

## 2024-02-14 LAB — MAGNESIUM: Magnesium: 1.4 mg/dL — ABNORMAL LOW (ref 1.7–2.4)

## 2024-02-14 MED ORDER — POTASSIUM CHLORIDE CRYS ER 20 MEQ PO TBCR
40.0000 meq | EXTENDED_RELEASE_TABLET | ORAL | Status: AC
Start: 2024-02-14 — End: 2024-02-14
  Administered 2024-02-14 (×2): 40 meq via ORAL
  Filled 2024-02-14 (×2): qty 2

## 2024-02-14 MED ORDER — MAGNESIUM SULFATE 2 GM/50ML IV SOLN
2.0000 g | Freq: Once | INTRAVENOUS | Status: AC
  Administered 2024-02-14: 2 g via INTRAVENOUS
  Filled 2024-02-14: qty 50

## 2024-02-14 MED ORDER — POTASSIUM CHLORIDE CRYS ER 20 MEQ PO TBCR
40.0000 meq | EXTENDED_RELEASE_TABLET | Freq: Once | ORAL | Status: AC
  Administered 2024-02-14: 40 meq via ORAL
  Filled 2024-02-14: qty 2

## 2024-02-14 MED ORDER — POTASSIUM CHLORIDE CRYS ER 20 MEQ PO TBCR
40.0000 meq | EXTENDED_RELEASE_TABLET | Freq: Two times a day (BID) | ORAL | Status: DC
  Filled 2024-02-14: qty 2

## 2024-02-14 MED ORDER — POTASSIUM CHLORIDE CRYS ER 20 MEQ PO TBCR
20.0000 meq | EXTENDED_RELEASE_TABLET | Freq: Once | ORAL | Status: AC
  Administered 2024-02-14: 20 meq via ORAL
  Filled 2024-02-14: qty 1

## 2024-02-14 MED ORDER — GUAIFENESIN ER 600 MG PO TB12
600.0000 mg | ORAL_TABLET | Freq: Two times a day (BID) | ORAL | Status: DC
  Administered 2024-02-14 – 2024-02-15 (×4): 600 mg via ORAL
  Filled 2024-02-14 (×3): qty 1

## 2024-02-14 MED ORDER — POTASSIUM CHLORIDE CRYS ER 20 MEQ PO TBCR
40.0000 meq | EXTENDED_RELEASE_TABLET | Freq: Once | ORAL | Status: AC
  Administered 2024-02-14: 40 meq via ORAL

## 2024-02-14 NOTE — Progress Notes (Signed)
 Pt's morning Bmet was significantly changed from prior Bmet, redrew lab potasium was improved but still 2.8 will pass on to day shift

## 2024-02-14 NOTE — Progress Notes (Signed)
 Pt currently in NSR with Ventricular Bigeminy vss pt asymptomatic  Con Em PA notified .

## 2024-02-14 NOTE — Progress Notes (Addendum)
 [address] [place] [address]             [phone]      6 Days Post-Op Procedure(s) (LRB): BENTALL PROCEDURE USING KONECT AORTIC VALVE CONDUIT SIZE (N/A) ECHOCARDIOGRAM, TRANSESOPHAGEAL, INTRAOPERATIVE (N/A) Subjective: Patient reports she feels tired this AM, she didn't sleep well last night. She also reports she took Tramadol  for head neck and back pain this AM but this is normal for her.   Objective: Vital signs in last 24 hours: Temp:  [97.9 F (36.6 C)-98.6 F (37 C)] 97.9 F (36.6 C) (08/10 0308) Pulse Rate:  [67-116] 67 (08/10 0308) Cardiac Rhythm: Atrial fibrillation (08/09 1927) Resp:  [11-20] 20 (08/10 0308) BP: (94-129)/(56-82) 121/82 (08/10 0308) SpO2:  [93 %-99 %] 99 % (08/10 0308) Weight:  [63.5 kg] 63.5 kg (08/10 0308)  Hemodynamic parameters for last 24 hours:    Intake/Output from previous day: 08/09 0701 - 08/10 0700 In: 240 [P.O.:240] Out: -  Intake/Output this shift: No intake/output data recorded.  General appearance: alert, cooperative, and no distress Neurologic: intact Heart: irregularly irregular rhythm Lungs: clear to auscultation bilaterally Abdomen: soft, non-tender; bowel sounds normal; no masses,  no organomegaly Extremities: extremities normal, atraumatic, no cyanosis or edema Wound: Clean and dry without sign of infection  Lab Results: Recent Labs    02/14/24 0319  WBC 13.3*  HGB 12.0  HCT 33.9*  PLT 303   BMET:  Recent Labs    02/14/24 0319 02/14/24 0452  NA 133* 132*  K 2.5* 2.8*  CL 83* 81*  CO2 34* 35*  GLUCOSE 108* 113*  BUN 17 17  CREATININE 1.08* 1.03*  CALCIUM  10.3 9.9    PT/INR: No results for input(s): LABPROT, INR in the last 72 hours. ABG    Component Value Date/Time   PHART 7.375 02/08/2024 1831   HCO3 21.4 02/08/2024 1831   TCO2 23 02/08/2024 1831   ACIDBASEDEF 3.0 (H) 02/08/2024 1831   O2SAT 97 02/08/2024 1831   CBG (last 3)  Recent Labs     02/11/24 1746 02/12/24 0537 02/12/24 1136  GLUCAP 134* 110* 128*    Assessment/Plan: S/P Procedure(s) (LRB): BENTALL PROCEDURE USING KONECT AORTIC VALVE CONDUIT SIZE (N/A) ECHOCARDIOGRAM, TRANSESOPHAGEAL, INTRAOPERATIVE (N/A)  Neuro: Pain controlled   CV: Atrial fibrillation overall rate has been mostly controlled, rates 90-100s at rest but gets up to 140s with ambulation. SBP mostly 100-120s, minimal lightheadedness with ambulation. On Amiodarone  400mg  BID, Lopressor  75mg  BID and Eliquis  5mg  daily.    Pulm: Saturating well on RA. CXR with resolved effusion and improved bibasilar atelectasis. She does report a cough this AM, hard to cough up sputum. Will add mucinex . Encourage IS and ambulation.    GI: Persistent nausea resolved. +BM. Tolerating a diet   Endo: No hx of DM, SSI has been d/c'd. Mild hyponatremia improved    Renal: Cr 1.03, slight bump from 0.77 yesterday. No UO recorded. Under preop weight. Completed Demadex  40mg  yesterday and was given potassium supplement but potassium went from 4.1 on 08/08 to 2.5 this AM, when rechecked potassium was 2.8. Will aggressively supplement potassium and recheck later today.    ID: Likely reactive leukocytosis, WBC 13.3 trending down from 19.7. Afebrile. Clinically monitor.   Expected postop ABLA: Last H/H 12/33.9, improved. Not clinically significant at this time. Continue iron supplement.    DVT Prophylaxis: No Lovenox, on Eliquis    Dispo: Supplement potassium. Afib with mostly  controlled rates except with ambulation. Will discuss dispo plan with surgeon. Possibly later today vs tomorrow.    LOS: 6 days    Con GORMAN Bend, PA-C 02/14/2024  Looks good, but tachy with ambulation Will watch today Dispo planning  Hannah Willis O Hannah Willis

## 2024-02-14 NOTE — Progress Notes (Signed)
 Pt ambulated x 470 with front wheel walker, hr increased to 140's-150, pt otherwise tolerated well

## 2024-02-14 NOTE — Progress Notes (Signed)
 Mobility Specialist Progress Note;   02/14/24 0838  Mobility  Activity Ambulated with assistance  Level of Assistance Contact guard assist, steadying assist  Assistive Device Front wheel walker  Distance Ambulated (ft) 470 ft  RUE Weight Bearing Per Provider Order NWB  LUE Weight Bearing Per Provider Order NWB  Activity Response Tolerated well  Mobility Referral Yes  Mobility visit 1 Mobility  Mobility Specialist Start Time (ACUTE ONLY) [id number]  Mobility Specialist Stop Time (ACUTE ONLY) 0850  Mobility Specialist Time Calculation (min) (ACUTE ONLY) 12 min   Pt agreeable to mobility. Required MinG assistance during ambulation for safety. HR up to 145 bpm w/ activity. No c/o dizziness when asked. Pt returned back to bed and left with all needs met, call bell in reach.   Lauraine Erm Mobility Specialist Please contact via SecureChat or [employer] [phone]

## 2024-02-15 LAB — BASIC METABOLIC PANEL WITH GFR
Anion gap: 12 (ref 5–15)
BUN: 16 mg/dL (ref 8–23)
CO2: 31 mmol/L (ref 22–32)
Calcium: 10.2 mg/dL (ref 8.9–10.3)
Chloride: 90 mmol/L — ABNORMAL LOW (ref 98–111)
Creatinine, Ser: 1.01 mg/dL — ABNORMAL HIGH (ref 0.44–1.00)
GFR, Estimated: 59 mL/min — ABNORMAL LOW (ref >60)
Glucose, Bld: 114 mg/dL — ABNORMAL HIGH (ref 70–99)
Potassium: 4.1 mmol/L (ref 3.5–5.1)
Sodium: 133 mmol/L — ABNORMAL LOW (ref 135–145)

## 2024-02-15 NOTE — Care Management Important Message (Signed)
 Important Message  Patient Details  Name: Hannah Willis MRN: [id number] Date of Birth: Jul 26, 1950   Important Message Given:  Yes - Medicare IM     Vonzell Arrie Sharps 02/15/2024, 10:57 AM

## 2024-02-15 NOTE — Progress Notes (Signed)
 Mobility Specialist Progress Note:    02/15/24 0909  Mobility  Activity Ambulated with assistance  Level of Assistance Standby assist, set-up cues, supervision of patient - no hands on  Assistive Device Front wheel walker  Distance Ambulated (ft) 470 ft  RUE Weight Bearing Per Provider Order NWB  LUE Weight Bearing Per Provider Order NWB  Activity Response Tolerated well  Mobility Referral Yes  Mobility visit 1 Mobility  Mobility Specialist Start Time (ACUTE ONLY) 0857  Mobility Specialist Stop Time (ACUTE ONLY) 0909  Mobility Specialist Time Calculation (min) (ACUTE ONLY) 12 min   Pt received in bed, agreeable to mobility session. Ambulated in hallway with RW and SBA. Tolerated well, asx throughout. Recorded BP, see below. Returned pt to room, lying in bed, all needs met.  Pre Mobility (Supine) :133/74 (89) During Mobility (Sitting EOB): 114/92 (99) Standing 1 min:126/71 (89) Post Mobility: 142/73 (93)  Corey Laski Mobility Specialist Please contact via Special educational needs teacher or  Rehab office at [phone]

## 2024-02-15 NOTE — Progress Notes (Signed)
      [address]       [place] [address]             [phone]      7 Days Post-Op Procedure(s) (LRB): BENTALL PROCEDURE USING KONECT AORTIC VALVE CONDUIT SIZE (N/A) ECHOCARDIOGRAM, TRANSESOPHAGEAL, INTRAOPERATIVE (N/A)  Subjective:  Patient doing well.  Her nausea is resolved.  She is ambulated without difficulty.  She has moved her bowels  Objective: Vital signs in last 24 hours: Temp:  [97.4 F (36.3 C)-99.1 F (37.3 C)] 98.9 F (37.2 C) (08/11 0415) Pulse Rate:  [70-91] 76 (08/10 1940) Cardiac Rhythm: Normal sinus rhythm (08/10 1959) Resp:  [14-20] 20 (08/11 0415) BP: (120-153)/(53-79) 153/79 (08/11 0415) SpO2:  [93 %-98 %] 95 % (08/11 0415) Weight:  [63.4 kg] 63.4 kg (08/11 0652)  Intake/Output from previous day: 08/10 0701 - 08/11 0700 In: 120 [P.O.:120] Out: -   General appearance: alert, cooperative, and no distress Heart: regular rate and rhythm Lungs: clear to auscultation bilaterally Abdomen: soft, non-tender; bowel sounds normal; no masses,  no organomegaly Extremities: edema trace Wound: clean and dry, Right side of sternotomy with mild swelling  Lab Results: Recent Labs    02/14/24 0319  WBC 13.3*  HGB 12.0  HCT 33.9*  PLT 303   BMET:  Recent Labs    02/14/24 1429 02/15/24 0406  NA 129* 133*  K 3.1* 4.1  CL 80* 90*  CO2 33* 31  GLUCOSE 123* 114*  BUN 16 16  CREATININE 0.98 1.01*  CALCIUM  9.4 10.2    PT/INR: No results for input(s): LABPROT, INR in the last 72 hours. ABG    Component Value Date/Time   PHART 7.375 02/08/2024 1831   HCO3 21.4 02/08/2024 1831   TCO2 23 02/08/2024 1831   ACIDBASEDEF 3.0 (H) 02/08/2024 1831   O2SAT 97 02/08/2024 1831   CBG (last 3)  Recent Labs    02/12/24 1136  GLUCAP 128*    Assessment/Plan: S/P Procedure(s) (LRB): BENTALL PROCEDURE USING KONECT AORTIC VALVE CONDUIT SIZE (N/A) ECHOCARDIOGRAM, TRANSESOPHAGEAL, INTRAOPERATIVE (N/A)  CV- PAF, now in NSR  with PVCs- continue Lopressor  75 mg BID, Amiodarone , Eliquis  Pulm- no acute issues, continue IS Renal- creatinine has been stable Hypokalemia- resolved, due to excessive diuretic use Dispo- patient stable, now in NSR, will d/c home today   LOS: 7 days   Rocky Shad, PA-C 02/15/2024

## 2024-02-15 NOTE — Progress Notes (Signed)
 Pt walked earlier this morning with MS. She denies questions about education she received on Saturday.  Hannah Willis Candy MS, ACSM-CEP  02/15/2024 9:35 AM

## 2024-02-17 ENCOUNTER — Ambulatory Visit: Attending: Surgery

## 2024-02-17 DIAGNOSIS — Z4802 Encounter for removal of sutures: Secondary | ICD-10-CM

## 2024-02-24 DIAGNOSIS — Z09 Encounter for follow-up examination after completed treatment for conditions other than malignant neoplasm: Secondary | ICD-10-CM | POA: Diagnosis not present

## 2024-02-24 DIAGNOSIS — E7800 Pure hypercholesterolemia, unspecified: Secondary | ICD-10-CM | POA: Diagnosis not present

## 2024-02-24 DIAGNOSIS — K219 Gastro-esophageal reflux disease without esophagitis: Secondary | ICD-10-CM | POA: Diagnosis not present

## 2024-02-24 DIAGNOSIS — Z13820 Encounter for screening for osteoporosis: Secondary | ICD-10-CM | POA: Diagnosis not present

## 2024-02-24 DIAGNOSIS — I679 Cerebrovascular disease, unspecified: Secondary | ICD-10-CM | POA: Diagnosis not present

## 2024-02-24 DIAGNOSIS — Z9081 Acquired absence of spleen: Secondary | ICD-10-CM | POA: Diagnosis not present

## 2024-02-25 ENCOUNTER — Other Ambulatory Visit (HOSPITAL_COMMUNITY): Payer: Self-pay

## 2024-03-01 ENCOUNTER — Ambulatory Visit: Attending: Cardiology | Admitting: Cardiology

## 2024-03-01 VITALS — BP 145/80 | HR 76 | Ht 62.0 in | Wt 138.0 lb

## 2024-03-01 DIAGNOSIS — Q2543 Congenital aneurysm of aorta: Secondary | ICD-10-CM

## 2024-03-01 DIAGNOSIS — I351 Nonrheumatic aortic (valve) insufficiency: Secondary | ICD-10-CM

## 2024-03-01 DIAGNOSIS — Z952 Presence of prosthetic heart valve: Secondary | ICD-10-CM | POA: Diagnosis not present

## 2024-03-01 DIAGNOSIS — I4819 Other persistent atrial fibrillation: Secondary | ICD-10-CM | POA: Diagnosis not present

## 2024-03-01 DIAGNOSIS — I5020 Unspecified systolic (congestive) heart failure: Secondary | ICD-10-CM | POA: Diagnosis not present

## 2024-03-01 NOTE — Progress Notes (Unsigned)
 Cardiology Office Note:  .   Date:  03/01/2024  ID:  Hannah Willis, DOB 1951-01-31, MRN [id number] PCP: Claudene Pellet, MD  [facility] Providers Cardiologist:  Newman Lawrence, MD PCP: Claudene Pellet, MD  No chief complaint on file.    Hannah Willis is a 73 y.o. 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 Reimplantation of left and right coronary arteries (02/2024), perioperative A-fib  History of Present Illness Hannah Willis is a 73 year old female with a history of stroke and mitral valve prolapse who presents with shortness of breath and chest tightness.  She experiences shortness of breath and chest tightness primarily during physical exertion, such as walking uphill. These symptoms have progressively worsened over the past couple of years. The chest tightness is described as a heaviness that occurs mainly when walking and sometimes when lying in bed, lasting for seconds and resolving on its own. She also experiences difficulty breathing when walking.  She has a history of a stroke 17 years ago 2 days after losing her mother, with blood pressures 200/100 mmHg.  She had right-sided loss of sensation, now only has mild residual loss of sensation in 2 fingers. She has been taking aspirin  325 mg daily since the stroke, except for a brief period when she was on a lower dose. She also takes losartan 50 mg, metoprolol  50 mg, and rosuvastatin  20 mg.  She reports swelling in her legs, which is more pronounced at times, and occasional lightheadedness but denies any episodes of passing out. She quit smoking and drinking nearly 30 years ago.  She has a history of mitral valve prolapse, which she recalls was very mild for many years and then  started getting a little bit stronger. Her last echocardiogram was in 2011.      Vitals:   03/01/24 1000  BP: (!) 145/80  Pulse: 76  SpO2: 96%       Review of Systems  Cardiovascular:  Positive for chest pain and dyspnea on exertion. Negative for leg swelling, palpitations and syncope.        Studies Reviewed: SABRA        EKG 03/01/2024: Atrial fibrillation with premature ventricular or aberrantly conducted complexes Cannot rule out Anterior infarct , age undetermined When compared with ECG of 09-Feb-2024 07:17, Atrial fibrillation has replaced Sinus rhythm Nonspecific T wave abnormality has replaced inverted T waves in Inferior leads QT has shortened   Intraoperative TEE 02/2020: POST-OP IMPRESSIONS  _ Left Ventricle: has low normal systolic function, estimated at 45-50%.  _ Right Ventricle: normal function.  _ Aorta: A graft was placed in the ascending aorta for repair.  _ Left Atrial Appendage: The left atrial appendage appears unchanged from pre-bypass.  _ Aortic Valve: Status post bioprosthetic aortic valve replacement, with  Madeline function of the prosthesis. No paravalvular or intravalvular leak  visualized. Trans-aortic valvular mean gradient .  _ Mitral Valve: The mitral valve appears unchanged from pre-bypass. There is moderate regurgitation.  _ Tricuspid Valve: The tricuspid valve appears unchanged from pre-bypass. There is mild regurgitation.  _ Pulmonic Valve: The pulmonic valve appears unchanged from pre-bypass.  _ Interatrial Septum: The interatrial septum appears unchanged from  pre-bypass.  _ Pericardium: The pericardium appears unchanged from pre-bypass.  _ Comments: No significant pleural or pericardial effusion.   CTA aorta 01/2024:  1. 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 2. No effacement of the sino-tubular junction and mildly ectatic but nonaneurysmal ascending aorta with normal transverse  and descending thoracic aorta diameters. 3. Aortic and coronary artery atherosclerotic vascular calcifications. 4. Four vessel arch anatomy. The left vertebral artery arises directly from the aorta. 5. Cardiomegaly with prominent left atrial enlargement and mild left ventricular dilatation. 6. Nonobstructing left lower pole nephrolithiasis. 7. Multilevel degenerative disc disease.    Coronary angiography 12/25/2023: LM: Normal LAD: Prox 30% , distal 40% disease Ramus: Small, no significant disease Lcx: No significant disease RCA: Large, dominant          RPLA focal 40% disease   LVEDP 30 mmHg   Right heart catheterization 12/25/2023: RA: 7 mmHg RV: 27/3 mmHg PA: 30/15 mmHg, mPAP 16 mmHg PCW: 12 mmHg   AO sats: 97% PA sats: 69%   CO: 3.9 L/min CI: 2.3 L/min/m2   Mild nonobstructive coronary artery disease Elevated LVEDP in the setting of severe aortic regurgitation and reduced LVEF Refer to cardiothoracic surgery for consideration for aortic valve and possible aortic root replacement    TEE 12/2023: 1. AI is central with eccentric course; likely due to dilated aortic  root.   2. Left ventricular ejection fraction, by estimation, is 45 to 50%. The  left ventricle has mildly decreased function. The left ventricle  demonstrates global hypokinesis. The left ventricular internal cavity size  was moderately dilated.   3. Right ventricular systolic function is normal. The right ventricular  size is normal.   4. Left atrial size was severely dilated. No left atrial/left atrial  appendage thrombus was detected.   5. 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 There is moderate holosystolic prolapse of the middle  segment of the anterior leaflet of the mitral valve.   6. The aortic valve is tricuspid. Aortic valve regurgitation is severe.  Aortic valve sclerosis is present, with no evidence of aortic valve  stenosis.   7. Aortic dilatation  noted. There is moderate dilatation of the aortic  root, measuring 47 mm. There is Moderate (Grade III) plaque involving the  descending aorta.   8. 3D performed of the mitral valve and 3D performed of the aortic valve.   Echocardiogram 12/07/2023:  1. Left ventricular ejection fraction, by estimation, is 45 to 50%. The  left ventricle has mildly decreased function. The left ventricle  demonstrates global hypokinesis. The left ventricular internal cavity size  was mildly dilated. There is mild eccentric left ventricular hypertrophy.  Left ventricular diastolic parameters are consistent with Grade II diastolic dysfunction  (pseudonormalization). Elevated left atrial pressure.   2. Right ventricular systolic function is normal. The right ventricular  size is normal. There is normal pulmonary artery systolic pressure. The  estimated right ventricular systolic pressure is 35.3 mmHg.   3. Left atrial size was severely dilated.   4. 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 No evidence of mitral stenosis. There is severe  holosystolic prolapse of the middle segment of the anterior leaflet of the  mitral valve.   5. The aortic valve is tricuspid. Aortic valve regurgitation is moderate  to severe. No aortic stenosis is present. Aortic regurgitation PHT  measures 276 msec.   6. Aortic dilatation noted. There is moderate dilatation of the aortic  root, measuring 45 mm. There is mild dilatation of the ascending aorta,  measuring  41 mm.   7. The inferior vena cava is normal in size with greater than 50%  respiratory variability, suggesting right atrial pressure of 3 mmHg.   Comparison(s): Prior images unable to be directly viewed, comparison made  by report only. Aortic insufficiency is a new dominant valvular  abnormality and the left ventricle has dilated and has reduced systolic  fumnction. 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    Labs 02/2024: Chol ***, TG ***, HDL ***, LDL *** HbA1C ***% Hb 12.0. Cr 1.01, Na 133 ***  11/2023: Chol 126, TG 49, HDL 65, LDL 50 HbA1C 5% Hb 13.5, WBC 19k Cr 0.55, T.bili 1.8 TSH 0.8   Physical Exam Vitals and nursing note reviewed.  Constitutional:      General: She is not in acute distress. Neck:     Vascular: No JVD.  Cardiovascular:     Rate and Rhythm: Normal rate and regular rhythm.     Heart sounds: Murmur heard.     High-pitched blowing holosystolic murmur is present with a grade of 2/6 at the apex.     High-pitched blowing decrescendo early diastolic murmur is present with a grade of 3/4 at the upper right sternal border radiating to the apex.  Pulmonary:     Effort: Pulmonary effort is normal.     Breath sounds: Normal breath sounds. No wheezing or rales.  Musculoskeletal:     Right lower leg: No edema.     Left lower leg: No edema.      VISIT DIAGNOSES: No diagnosis found.     Hannah Willis is a 73 y.o. 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 Reimplantation of left and right coronary arteries (02/2024), perioperative A-fib Assessment & Plan Aortic regurgitation, aortic root aneurysm: Severe aortic regurgitation likely emanating from 5.2 cm aortic root dilatation.  The valve itself appears to be normal.  Patient is symptomatic with exertional dyspnea and occasional chest pain.  LVEF is 45-50%.  I do think she will benefit from surgery given her symptomatic severe AI with LV dysfunction.  Whether she can undergo valve sparing aortic root replacement surgery or she would need aortic valve replacement will depend on her evaluation by Dr. Lucas next week.  In the meantime, continue losartan and metoprolol , along with Lasix .   Depending on her postop recovery and postop EF, we will need to consider if she would need other GDMT for HFrEF or not.  Aortic root aneurysm: Likely the cause for her severe aortic regurgitation.  There is no effacement of sinotubular junction, or aneurysm and rest of her aorta.  Patient does not have any siblings or children.  I offered genetic counseling, but it would likely add no further value to patient care, therefore opted out.  I wished her and her husband the best as they plan for potential upcoming surgery.  F/u in 3 months  Signed, Newman JINNY Lawrence, MD

## 2024-03-01 NOTE — Patient Instructions (Signed)
 Medication Instructions:  STOP Aspirin    *If you need a refill on your cardiac medications before your next appointment, please call your pharmacy*  Lab Work IN 4 WEEKS: CBC CMP TSH  If you have labs (blood work) drawn today and your tests are completely normal, you will receive your results only by: MyChart Message (if you have MyChart) OR A paper copy in the mail If you have any lab test that is abnormal or we need to change your treatment, we will call you to review the results.  Follow-Up: At [facility], you and your health needs are our priority.  As part of our continuing mission to provide you with exceptional heart care, our providers are all part of one team.  This team includes your primary Cardiologist (physician) and Advanced Practice Providers or APPs (Physician Assistants and Nurse Practitioners) who all work together to provide you with the care you need, when you need it.  Your next appointment:   4 week(s)  Provider:   One of our Advanced Practice Providers (APPs): Morse Clause, PA-C  Lamarr Satterfield, NP Miriam Shams, NP  Olivia Pavy, PA-C Josefa Beauvais, NP  Leontine Salen, PA-C Orren Fabry, PA-C  [place], [place] Jackee Alberts, NP  Damien Braver, NP Jon Hails, PA-C  Waddell Donath, PA-C    Dayna Dunn, PA-C  Glendia Ferrier, PA-C Lum Louis, NP Katlyn West, NP Callie Goodrich, PA-C  Evan Williams, PA-C Sheng Haley, PA-C  Xika Zhao, NP Kathleen Johnson, PA-C

## 2024-03-02 ENCOUNTER — Encounter: Payer: Self-pay | Admitting: Cardiology

## 2024-03-02 DIAGNOSIS — I4819 Other persistent atrial fibrillation: Secondary | ICD-10-CM | POA: Insufficient documentation

## 2024-03-08 ENCOUNTER — Other Ambulatory Visit: Payer: Self-pay | Admitting: Surgery

## 2024-03-08 DIAGNOSIS — I351 Nonrheumatic aortic (valve) insufficiency: Secondary | ICD-10-CM

## 2024-03-09 ENCOUNTER — Encounter: Payer: Self-pay | Admitting: Surgery

## 2024-03-09 ENCOUNTER — Ambulatory Visit

## 2024-03-09 ENCOUNTER — Ambulatory Visit (HOSPITAL_COMMUNITY)
Admission: RE | Admit: 2024-03-09 | Discharge: 2024-03-09 | Disposition: A | Discharge status: Home / Self Care | Source: Ambulatory Visit | Attending: Cardiovascular Disease | Admitting: Cardiovascular Disease

## 2024-03-09 ENCOUNTER — Ambulatory Visit: Attending: Surgery | Admitting: Surgery

## 2024-03-09 ENCOUNTER — Ambulatory Visit: Admitting: Surgery

## 2024-03-09 VITALS — BP 130/89 | HR 62 | Resp 18 | Ht 62.0 in | Wt 135.0 lb

## 2024-03-09 VITALS — BP 96/64 | HR 68 | Temp 97.8°F | Resp 20 | Ht 62.0 in | Wt 134.0 lb

## 2024-03-09 DIAGNOSIS — Z96611 Presence of right artificial shoulder joint: Secondary | ICD-10-CM | POA: Diagnosis not present

## 2024-03-09 DIAGNOSIS — K5130 Ulcerative (chronic) rectosigmoiditis without complications: Secondary | ICD-10-CM

## 2024-03-09 DIAGNOSIS — I351 Nonrheumatic aortic (valve) insufficiency: Secondary | ICD-10-CM

## 2024-03-09 DIAGNOSIS — I700 Atherosclerosis of aorta: Secondary | ICD-10-CM | POA: Diagnosis not present

## 2024-03-09 DIAGNOSIS — Q2543 Congenital aneurysm of aorta: Secondary | ICD-10-CM

## 2024-03-09 DIAGNOSIS — J90 Pleural effusion, not elsewhere classified: Secondary | ICD-10-CM | POA: Diagnosis not present

## 2024-03-09 DIAGNOSIS — I7121 Aneurysm of the ascending aorta, without rupture: Secondary | ICD-10-CM

## 2024-03-09 DIAGNOSIS — Z952 Presence of prosthetic heart valve: Secondary | ICD-10-CM

## 2024-03-09 MED ORDER — VEDOLIZUMAB 300 MG IV SOLR
300.0000 mg | Freq: Once | INTRAVENOUS | Status: AC
  Administered 2024-03-09: 300 mg via INTRAVENOUS
  Filled 2024-03-09: qty 5

## 2024-03-09 NOTE — Progress Notes (Signed)
 [address] [place] [address]             [phone]     HPI: Patient returns for routine postoperative follow-up having undergone biological Bentall procedure using a 25 mm Edwards KONECT RESILIA pericardial valved conduit with reimplantation of left and right coronary arteries  on 02/08/2024. The patient's early postoperative recovery while in the hospital was notable for development of postoperative atrial fibrillation converted with amiodarone . Since hospital discharge the patient reports that she is feeling well.  She is walking daily without chest pain or shortness of breath.  Her husband is here with her today.   Current Outpatient Medications  Medication Sig Dispense Refill   acetaminophen  (TYLENOL ) 500 MG tablet Take 1-2 tablets (500-1,000 mg total) by mouth every 6 (six) hours as needed.     alendronate (FOSAMAX) 70 MG tablet Take 70 mg by mouth once a week. Take with a full glass of water  on an empty stomach.     amiodarone  (PACERONE ) 200 MG tablet Take 2 tablets by mouth twice per day for 7 days, then decrease to 1 tablet twice per day for 7 days, then decrease to 1 tablet daily thereafter 60 tablet 1   apixaban  (ELIQUIS ) 5 MG TABS tablet Take 1 tablet (5 mg total) by mouth 2 (two) times daily. 60 tablet 3   Apoaequorin (PREVAGEN PO) Take 1 tablet by mouth daily.     calcium  carbonate (OSCAL) 1500 (600 Ca) MG TABS tablet Take by mouth 2 (two) times daily with a meal. (Patient not taking: Reported on 03/01/2024)     ferrous sulfate  325 (65 FE) MG tablet Take 325 mg by mouth daily. (Patient not taking: Reported on 03/01/2024)     Metoprolol  Tartrate 75 MG TABS Take 1 tablet (75 mg total) by mouth 2 (two) times daily. 60 tablet 1   ondansetron  (ZOFRAN ) 4 MG tablet Take 1 tablet (4 mg total) by mouth every 8 (eight) hours as needed for nausea or vomiting. 90 tablet 1   rosuvastatin  (CRESTOR ) 20 MG tablet Take 20 mg by mouth daily.     thiamine  (VITAMIN B-1)  100 MG tablet Take 100 mg by mouth daily.     traMADol  (ULTRAM ) 50 MG tablet Take 1 tablet (50 mg total) by mouth every 6 (six) hours as needed for severe pain (pain score 7-10). 28 tablet 0   Vedolizumab  (ENTYVIO  IV) Inject 1 Dose as directed every 8 (eight) weeks. Experimental every two months, given at the MD office     VITAMIN D  PO Take 1 tablet by mouth daily. (Patient not taking: Reported on 03/01/2024)     No current facility-administered medications for this visit.    Physical Exam: BP 130/89 (BP Location: Right Arm)   Pulse 62   Resp 18   Ht 5' 2 (1.575 m)   Wt 135 lb (61.2 kg)   SpO2 96%   BMI 24.69 kg/m  She looks well. Cardiac exam shows a regular rate and rhythm with normal heart sounds.  There is no murmur. Lungs are clear. The chest incision is healing well and the sternum is stable. There is no peripheral edema  Diagnostic Tests:  CLINICAL DATA:  73 year old female with aortic regurgitation. Status post surgery last month.   EXAM: CHEST - 2 VIEW   COMPARISON:  Chest radiographs 02/14/2024 and earlier.   FINDINGS: PA and lateral views. Prosthetic aortic valve redemonstrated. Stable cardiomegaly and mediastinal contours. Stable  tortuosity of the aorta, calcified aortic atherosclerosis. Visualized tracheal air column is within normal limits. Small pleural effusions have resolved. Improved lung volumes and ventilation. No pneumothorax, pulmonary edema, confluent lung opacity.   Stable sternotomy. Right shoulder arthroplasty redemonstrated. Degenerative changes in the spine. No acute osseous abnormality identified.   Negative visible bowel gas pattern.   IMPRESSION: 1. Resolved postoperative pleural effusions. No acute cardiopulmonary abnormality. 2. Prosthetic aortic valve, Aortic Atherosclerosis (ICD10-I70.0).     Electronically Signed   By: VEAR Hurst M.D.   On: 03/09/2024 08:24    Impression:  She is doing very well 1 month following her  surgery.  I encouraged her to continue walking is much as possible.  I told her that she can return to driving a car at this time but should refrain from lifting anything heavier than 10 pounds for 3 months postoperatively.  She was in persistent atrial fibrillation when she saw Dr. Elmira on 03/01/2024.  She has a regular rhythm today which I think is sinus.  She will continue on amiodarone  until she returns to see cardiology.  Plan:  She will continue to follow-up with cardiology and I will plan to see her back in 1 year for a CTA of the chest for aortic surveillance.   Hannah Willis Hannah Fellers, MD [facility] [phone]

## 2024-03-09 NOTE — Progress Notes (Signed)
 Diagnosis: Ulcerative Colitis  Provider:  Praveen Mannam MD  Procedure: IV Infusion  IV Type: Peripheral, IV Location: R Antecubital  Entyvio  (Vedolizumab ), Dose: 300 mg  Infusion Start Time: 1101  Infusion Stop Time: 1135  Post Infusion IV Care: Peripheral IV Discontinued  Discharge: Condition: Good, Destination: Home . AVS Provided  Performed by:  Maximiano JONELLE Pouch, LPN

## 2024-03-10 ENCOUNTER — Other Ambulatory Visit: Payer: Self-pay | Admitting: Cardiology

## 2024-03-10 MED ORDER — METOPROLOL TARTRATE 75 MG PO TABS: 75.0000 mg | ORAL_TABLET | Freq: Two times a day (BID) | ORAL | 3 refills | Status: DC

## 2024-03-10 MED ORDER — APIXABAN 5 MG PO TABS: 5.0000 mg | ORAL_TABLET | Freq: Two times a day (BID) | ORAL | 1 refills | Status: AC

## 2024-03-10 NOTE — Telephone Encounter (Signed)
 Pt's medication was sent to pt's pharmacy as requested. Confirmation received.

## 2024-03-10 NOTE — Telephone Encounter (Signed)
*  STAT* If patient is at the pharmacy, call can be transferred to refill team.     1. Which medications need to be refilled? (please list name of each medication and dose if known) apixaban  (ELIQUIS ) 5 MG TABS tablet   Metoprolol  Tartrate 75 MG TABS   2. Would you like to learn more about the convenience, safety, & potential cost savings by using the [facility]? No      3. Are you open to using the [facility] (Type [facility].No     4. Which pharmacy/location (including street and city if local pharmacy) is medication to be sent to? [id number] - [place], [place] - [address] [facility]       5. Do they need a 30 day or 90 day supply? 90 day

## 2024-03-10 NOTE — Telephone Encounter (Signed)
 Prescription refill request for Eliquis  received. Indication: afib  Last office visit: Patwardhan 03/01/2024 Scr: 1.01, 02/15/2024 Age: 73 yo  Weight: 60.8 kg   Refill sent.

## 2024-03-14 DIAGNOSIS — M8589 Other specified disorders of bone density and structure, multiple sites: Secondary | ICD-10-CM | POA: Diagnosis not present

## 2024-03-23 ENCOUNTER — Encounter: Attending: Cardiology | Admitting: *Deleted

## 2024-03-23 ENCOUNTER — Ambulatory Visit (HOSPITAL_COMMUNITY)
Admission: RE | Admit: 2024-03-23 | Discharge: 2024-03-23 | Disposition: A | Discharge status: Home / Self Care | Source: Ambulatory Visit | Attending: Cardiovascular Disease | Admitting: Cardiovascular Disease

## 2024-03-23 ENCOUNTER — Ambulatory Visit: Payer: Self-pay | Admitting: Cardiology

## 2024-03-23 DIAGNOSIS — Z48812 Encounter for surgical aftercare following surgery on the circulatory system: Secondary | ICD-10-CM | POA: Insufficient documentation

## 2024-03-23 DIAGNOSIS — Q2543 Congenital aneurysm of aorta: Secondary | ICD-10-CM | POA: Insufficient documentation

## 2024-03-23 DIAGNOSIS — Z952 Presence of prosthetic heart valve: Secondary | ICD-10-CM | POA: Insufficient documentation

## 2024-03-23 LAB — ECHOCARDIOGRAM COMPLETE
AV Mean grad: 5 mmHg
AV Peak grad: 9 mmHg
Ao pk vel: 1.5 m/s
Calc EF: 40.3 %
S' Lateral: 3.6 cm
Single Plane A2C EF: 46.7 %
Single Plane A4C EF: 39 %

## 2024-03-23 NOTE — Progress Notes (Signed)
 EF remains mildly reduced. I do think this will improve over a period of time, but consider starting GDMT, may be with losartan and change metoprolol  tartrate to succinate. We can add SGLT2i later. She is seeing you next week.  Thanks MJP

## 2024-03-23 NOTE — Progress Notes (Signed)
 Initial phone call completed. Diagnosis can be found in [facility] 8/26. EP Orientation scheduled for Tuesday 9/23 at 1:30.

## 2024-03-29 ENCOUNTER — Encounter

## 2024-03-29 VITALS — Ht 62.6 in | Wt 135.7 lb

## 2024-03-29 DIAGNOSIS — Z952 Presence of prosthetic heart valve: Secondary | ICD-10-CM | POA: Diagnosis not present

## 2024-03-29 DIAGNOSIS — Z48812 Encounter for surgical aftercare following surgery on the circulatory system: Secondary | ICD-10-CM | POA: Diagnosis not present

## 2024-03-29 NOTE — Progress Notes (Signed)
 Cardiac Individual Treatment Plan  Patient Details  Name: Hannah Willis MRN: [id number] Date of Birth: 05-23-1951 Referring Provider:   Flowsheet Row Cardiac Rehab from 03/29/2024 in [facility] Cardiac and Pulmonary Rehab  Referring Provider Dr. Newman Lawrence    Initial Encounter Date:  Flowsheet Row Cardiac Rehab from 03/29/2024 in [facility] Cardiac and Pulmonary Rehab  Date 03/29/24    Visit Diagnosis: S/P aortic valve replacement  Patient's Home Medications on Admission:  Current Outpatient Medications:    acetaminophen  (TYLENOL ) 500 MG tablet, Take 1-2 tablets (500-1,000 mg total) by mouth every 6 (six) hours as needed., Disp: , Rfl:    alendronate (FOSAMAX) 70 MG tablet, Take 70 mg by mouth once a week. Take with a full glass of water  on an empty stomach., Disp: , Rfl:    amiodarone  (PACERONE ) 200 MG tablet, Take 2 tablets by mouth twice per day for 7 days, then decrease to 1 tablet twice per day for 7 days, then decrease to 1 tablet daily thereafter, Disp: 60 tablet, Rfl: 1   apixaban  (ELIQUIS ) 5 MG TABS tablet, Take 1 tablet (5 mg total) by mouth 2 (two) times daily., Disp: 180 tablet, Rfl: 1   Apoaequorin (PREVAGEN PO), Take 1 tablet by mouth daily. (Patient not taking: Reported on 03/09/2024), Disp: , Rfl:    calcium  carbonate (OSCAL) 1500 (600 Ca) MG TABS tablet, Take by mouth 2 (two) times daily with a meal. (Patient not taking: Reported on 03/09/2024), Disp: , Rfl:    ferrous sulfate  325 (65 FE) MG tablet, Take 325 mg by mouth daily. (Patient not taking: Reported on 03/09/2024), Disp: , Rfl:    Metoprolol  Tartrate 75 MG TABS, Take 1 tablet (75 mg total) by mouth 2 (two) times daily., Disp: 180 tablet, Rfl: 3   ondansetron  (ZOFRAN ) 4 MG tablet, Take 1 tablet (4 mg total) by mouth every 8 (eight) hours as needed for nausea or vomiting., Disp: 90 tablet, Rfl: 1   rosuvastatin  (CRESTOR ) 20 MG tablet, Take 20 mg by mouth daily., Disp: , Rfl:    thiamine  (VITAMIN B-1) 100 MG tablet, Take  100 mg by mouth daily. (Patient not taking: Reported on 03/09/2024), Disp: , Rfl:    traMADol  (ULTRAM ) 50 MG tablet, Take 1 tablet (50 mg total) by mouth every 6 (six) hours as needed for severe pain (pain score 7-10)., Disp: 28 tablet, Rfl: 0   Vedolizumab  (ENTYVIO  IV), Inject 1 Dose as directed every 8 (eight) weeks. Experimental every two months, given at the MD office (Patient not taking: Reported on 03/09/2024), Disp: , Rfl:    VITAMIN D  PO, Take 1 tablet by mouth daily. (Patient not taking: Reported on 03/09/2024), Disp: , Rfl:   Past Medical History: Past Medical History:  Diagnosis Date   Allergy    Apnea, sleep    no c-pap machine   Arthritis    OA   Basal cell carcinoma    on front chest   Blood in stool    LAST 2 MONTHS, MEDS CHNAGED FOR   GERD (gastroesophageal reflux disease)    Heart murmur    HFrEF (heart failure with reduced ejection fraction) (HCC) 01/20/2024   Hyperlipidemia    Hypertension    Kidney stones    Laryngitis    started monday night   Neuromuscular disorder (HCC)    Post-operative nausea and vomiting    Severe aortic insufficiency    Severe aortic regurgitation 12/21/2023   Spherocytosis (familial)    Stroke (HCC) 09/21/2007  Synovitis of right hand 12/29/2015   and wrist   Thoracic aortic aneurysm    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   Ulcerative colitis (HCC)    Weakness of right hand    due to stroke    Tobacco Use: Social History   Tobacco Use  Smoking Status Former   Current packs/day: 0.00   Types: Cigarettes   Quit date: 06/20/1989   Years since quitting: 34.7  Smokeless Tobacco Never  Tobacco Comments   smoked occasional cigarettes for 5 years    Labs: Review Flowsheet  More data may exist      Latest Ref Rng & Units 09/21/2007 04/04/2015 12/25/2023 02/05/2024 02/08/2024  Labs for ITP Cardiac and Pulmonary Rehab  Cholestrol 100 - 199 mg/dL 821        ATP III CLASSIFICATION:  <200     mg/dL   Desirable  799-760  mg/dL    Borderline High  >=759    mg/dL   High  798  - - -  LDL (calc) 0 - 99 mg/dL 885        Total Cholesterol/HDL:CHD Risk Coronary Heart Disease Risk Table                     Men   Women  1/2 Average Risk   3.4   3.3  114  - - -  HDL-C >39 mg/dL 54  69  - - -  Trlycerides 0 - 149 mg/dL 51  89  - - -  Hemoglobin A1c 4.8 - 5.6 % - 5.4  - 5.4  -  PH, Arterial 7.35 - 7.45 - - 7.432  - 7.375  7.364  7.381  7.433  7.467  7.484   PCO2 arterial 32 - 48 mmHg - - 33.0  - 36.8  40.3  35.7  33.3  32.2  28.5   Bicarbonate 20.0 - 28.0 mmol/L - - 23.7  23.9  22.0  - 21.4  22.8  21.6  22.3  23.3  23.3  21.4   TCO2 22 - 32 mmol/L - - 25  25  23   - 23  24  23  23  26  27  24  26  26  27  24  22  22  22    Acid-base deficit 0.0 - 2.0 mmol/L - - 1.0  1.0  2.0  - 3.0  2.0  4.0  2.0  1.0  2.0   O2 Saturation % - - 66  72  97  - 97  97  97  100  100  72  100     Details       Multiple values from one day are sorted in reverse-chronological order          Exercise Target Goals: Exercise Program Goal: Individual exercise prescription set using results from initial 6 min walk test and THRR while considering  patient's activity barriers and safety.   Exercise Prescription Goal: Initial exercise prescription builds to 30-45 minutes a day of aerobic activity, 2-3 days per week.  Home exercise guidelines will be given to patient during program as part of exercise prescription that the participant will acknowledge.   Education: Aerobic Exercise: - Group verbal and visual presentation on the components of exercise prescription. Introduces F.I.T.T principle from ACSM for exercise prescriptions.  Reviews F.I.T.T. principles of aerobic exercise including progression. Written material provided at class time.   Education: Resistance Exercise: - Group verbal and visual presentation on  the components of exercise prescription. Introduces F.I.T.T principle from ACSM for exercise prescriptions  Reviews F.I.T.T. principles  of resistance exercise including progression. Written material provided at class time.    Education: Exercise & Equipment Safety: - Individual verbal instruction and demonstration of equipment use and safety with use of the equipment. Flowsheet Row Cardiac Rehab from 03/29/2024 in [facility] Cardiac and Pulmonary Rehab  Date 03/29/24  Educator MJC  Instruction Review Code 1- Verbalizes Understanding    Education: Exercise Physiology & General Exercise Guidelines: - Group verbal and written instruction with models to review the exercise physiology of the cardiovascular system and associated critical values. Provides general exercise guidelines with specific guidelines to those with heart or lung disease. Written material provided at class time.   Education: Flexibility, Balance, Mind/Body Relaxation: - Group verbal and visual presentation with interactive activity on the components of exercise prescription. Introduces F.I.T.T principle from ACSM for exercise prescriptions. Reviews F.I.T.T. principles of flexibility and balance exercise training including progression. Also discusses the mind body connection.  Reviews various relaxation techniques to help reduce and manage stress (i.e. Deep breathing, progressive muscle relaxation, and visualization). Balance handout provided to take home. Written material provided at class time.   Activity Barriers & Risk Stratification:  Activity Barriers & Cardiac Risk Stratification - 03/29/24 1506       Activity Barriers & Cardiac Risk Stratification   Activity Barriers Left Knee Replacement;Right Knee Replacement;Joint Problems    Cardiac Risk Stratification Moderate          6 Minute Walk:  6 Minute Walk     Row Name 03/29/24 1504         6 Minute Walk   Phase Initial     Distance 1135 feet     Walk Time 6 minutes     # of Rest Breaks 0     MPH 2.14     METS 2.46     RPE 11     Perceived Dyspnea  1     VO2 Peak 8.6     Symptoms No      Resting HR 60 bpm     Resting BP 126/68     Resting Oxygen Saturation  90 %     Exercise Oxygen Saturation  during 6 min walk 90 %     Max Ex. HR 83 bpm     Max Ex. BP 148/64     2 Minute Post BP 124/62        Oxygen Initial Assessment:   Oxygen Re-Evaluation:   Oxygen Discharge (Final Oxygen Re-Evaluation):   Initial Exercise Prescription:  Initial Exercise Prescription - 03/29/24 1500       Date of Initial Exercise RX and Referring Provider   Date 03/29/24    Referring Provider Dr. Newman Lawrence      Oxygen   Maintain Oxygen Saturation 88% or higher      Treadmill   MPH 2.2    Grade 0    Minutes 15    METs 2.68      Recumbant Bike   Level 2    RPM 50    Watts 25    Minutes 15    METs 2.46      NuStep   Level 2    SPM 80    Minutes 15    METs 2.46      T5 Nustep   Level 2    SPM 80    Minutes 15    METs  2.46      Biostep-RELP   Level 2    SPM 50    Minutes 15    METs 2.46      Track   Laps 30    Minutes 15    METs 2.63      Prescription Details   Duration Progress to 30 minutes of continuous aerobic without signs/symptoms of physical distress      Intensity   THRR 40-80% of Max Heartrate 94129    Ratings of Perceived Exertion 11-13    Perceived Dyspnea 0-4      Progression   Progression Continue to progress workloads to maintain intensity without signs/symptoms of physical distress.      Resistance Training   Training Prescription Yes    Weight 2lb    Reps 10-15          Perform Capillary Blood Glucose checks as needed.  Exercise Prescription Changes:   Exercise Prescription Changes     Row Name 03/29/24 1500             Response to Exercise   Blood Pressure (Admit) 126/68       Blood Pressure (Exercise) 148/64       Blood Pressure (Exit) 124/62       Heart Rate (Admit) 60 bpm       Heart Rate (Exercise) 83 bpm       Heart Rate (Exit) 59 bpm       Oxygen Saturation (Admit) 90 %       Oxygen Saturation  (Exercise) 90 %       Oxygen Saturation (Exit) 90 %       Rating of Perceived Exertion (Exercise) 11       Perceived Dyspnea (Exercise) 1       Symptoms none       Comments results          Exercise Comments:   Exercise Goals and Review:   Exercise Goals     Row Name 03/29/24 1510             Exercise Goals   Increase Physical Activity Yes       Intervention Provide advice, education, support and counseling about physical activity/exercise needs.;Develop an individualized exercise prescription for aerobic and resistive training based on initial evaluation findings, risk stratification, comorbidities and participant's personal goals.       Expected Outcomes Long Term: Exercising regularly at least 3-5 days a week.;Long Term: Add in home exercise to make exercise part of routine and to increase amount of physical activity.;Short Term: Attend rehab on a regular basis to increase amount of physical activity.       Increase Strength and Stamina Yes       Intervention Provide advice, education, support and counseling about physical activity/exercise needs.;Develop an individualized exercise prescription for aerobic and resistive training based on initial evaluation findings, risk stratification, comorbidities and participant's personal goals.       Expected Outcomes Short Term: Increase workloads from initial exercise prescription for resistance, speed, and METs.;Short Term: Perform resistance training exercises routinely during rehab and add in resistance training at home;Long Term: Improve cardiorespiratory fitness, muscular endurance and strength as measured by increased METs and functional capacity ( )       Able to understand and use rate of perceived exertion (RPE) scale Yes       Intervention Provide education and explanation on how to use RPE scale       Expected Outcomes  Long Term:  Able to use RPE to guide intensity level when exercising independently;Short Term: Able to  use RPE daily in rehab to express subjective intensity level       Able to understand and use Dyspnea scale Yes       Intervention Provide education and explanation on how to use Dyspnea scale       Expected Outcomes Short Term: Able to use Dyspnea scale daily in rehab to express subjective sense of shortness of breath during exertion;Long Term: Able to use Dyspnea scale to guide intensity level when exercising independently       Knowledge and understanding of Target Heart Rate Range (THRR) Yes       Intervention Provide education and explanation of THRR including how the numbers were predicted and where they are located for reference       Expected Outcomes Short Term: Able to state/look up THRR;Short Term: Able to use daily as guideline for intensity in rehab;Long Term: Able to use THRR to govern intensity when exercising independently       Able to check pulse independently Yes       Intervention Provide education and demonstration on how to check pulse in carotid and radial arteries.;Review the importance of being able to check your own pulse for safety during independent exercise       Expected Outcomes Short Term: Able to explain why pulse checking is important during independent exercise;Long Term: Able to check pulse independently and accurately       Understanding of Exercise Prescription Yes       Intervention Provide education, explanation, and written materials on patient's individual exercise prescription       Expected Outcomes Short Term: Able to explain program exercise prescription;Long Term: Able to explain home exercise prescription to exercise independently          Exercise Goals Re-Evaluation :   Discharge Exercise Prescription (Final Exercise Prescription Changes):  Exercise Prescription Changes - 03/29/24 1500       Response to Exercise   Blood Pressure (Admit) 126/68    Blood Pressure (Exercise) 148/64    Blood Pressure (Exit) 124/62    Heart Rate (Admit) 60 bpm     Heart Rate (Exercise) 83 bpm    Heart Rate (Exit) 59 bpm    Oxygen Saturation (Admit) 90 %    Oxygen Saturation (Exercise) 90 %    Oxygen Saturation (Exit) 90 %    Rating of Perceived Exertion (Exercise) 11    Perceived Dyspnea (Exercise) 1    Symptoms none    Comments results          Nutrition:  Target Goals: Understanding of nutrition guidelines, daily intake of sodium 1500mg , cholesterol 200mg , calories 30% from fat and 7% or less from saturated fats, daily to have 5 or more servings of fruits and vegetables.  Education: Nutrition 1 -Group instruction provided by verbal, written material, interactive activities, discussions, models, and posters to present general guidelines for heart healthy nutrition including macronutrients, label reading, and promoting whole foods over processed counterparts. Education serves as Pensions consultant of discussion of heart healthy eating for all. Written material provided at class time.    Education: Nutrition 2 -Group instruction provided by verbal, written material, interactive activities, discussions, models, and posters to present general guidelines for heart healthy nutrition including sodium, cholesterol, and saturated fat. Providing guidance of habit forming to improve blood pressure, cholesterol, and body weight. Written material provided at class time.  Biometrics:  Pre Biometrics - 03/29/24 1510       Pre Biometrics   Height 5' 2.6 (1.59 m)    Weight 135 lb 11.2 oz (61.6 kg)    Waist Circumference 32 inches    Hip Circumference 35.5 inches    Waist to Hip Ratio 0.9 %    BMI (Calculated) 24.35    Single Leg Stand 7 seconds           Nutrition Therapy Plan and Nutrition Goals:   Nutrition Assessments:  MEDIFICTS Score Key: >=70 Need to make dietary changes  40-70 Heart Healthy Diet <= 40 Therapeutic Level Cholesterol Diet   Picture Your Plate Scores: <59 Unhealthy dietary pattern with much room for  improvement. 41-50 Dietary pattern unlikely to meet recommendations for good health and room for improvement. 51-60 More healthful dietary pattern, with some room for improvement.  >60 Healthy dietary pattern, although there may be some specific behaviors that could be improved.    Nutrition Goals Re-Evaluation:   Nutrition Goals Discharge (Final Nutrition Goals Re-Evaluation):   Psychosocial: Target Goals: Acknowledge presence or absence of significant depression and/or stress, maximize coping skills, provide positive support system. Participant is able to verbalize types and ability to use techniques and skills needed for reducing stress and depression.   Education: Stress, Anxiety, and Depression - Group verbal and visual presentation to define topics covered.  Reviews how body is impacted by stress, anxiety, and depression.  Also discusses healthy ways to reduce stress and to treat/manage anxiety and depression. Written material provided at class time.   Education: Sleep Hygiene -Provides group verbal and written instruction about how sleep can affect your health.  Define sleep hygiene, discuss sleep cycles and impact of sleep habits. Review good sleep hygiene tips.   Initial Review & Psychosocial Screening:  Initial Psych Review & Screening - 03/23/24 1343       Initial Review   Current issues with None Identified      Family Dynamics   Good Support System? Yes      Barriers   Psychosocial barriers to participate in program There are no identifiable barriers or psychosocial needs.;The patient should benefit from training in stress management and relaxation.      Screening Interventions   Interventions Encouraged to exercise;Provide feedback about the scores to participant;To provide support and resources with identified psychosocial needs    Expected Outcomes Long Term Goal: Stressors or current issues are controlled or eliminated.;Short Term goal: Utilizing psychosocial  counselor, staff and physician to assist with identification of specific Stressors or current issues interfering with healing process. Setting desired goal for each stressor or current issue identified.;Short Term goal: Identification and review with participant of any Quality of Life or Depression concerns found by scoring the questionnaire.;Long Term goal: The participant improves quality of Life and PHQ9 Scores as seen by post scores and/or verbalization of changes          Quality of Life Scores:   Scores of 19 and below usually indicate a poorer quality of life in these areas.  A difference of  2-3 points is a clinically meaningful difference.  A difference of 2-3 points in the total score of the Quality of Life Index has been associated with significant improvement in overall quality of life, self-image, physical symptoms, and general health in studies assessing change in quality of life.  PHQ-9: Review Flowsheet       03/29/2024  Depression screen PHQ 2/9  Decreased Interest 0  Down, Depressed, Hopeless 0  PHQ - 2 Score 0  Altered sleeping 2  Tired, decreased energy 1  Change in appetite 0  Feeling bad or failure about yourself  0  Trouble concentrating 0  Moving slowly or fidgety/restless 0  Suicidal thoughts 0  PHQ-9 Score 3  Difficult doing work/chores Not difficult at all   Interpretation of Total Score  Total Score Depression Severity:  1-4 = Minimal depression, 5-9 = Mild depression, 10-14 = Moderate depression, 15-19 = Moderately severe depression, 20-27 = Severe depression   Psychosocial Evaluation and Intervention:  Psychosocial Evaluation - 03/23/24 1352       Psychosocial Evaluation & Interventions   Interventions Encouraged to exercise with the program and follow exercise prescription    Comments Ms. Coulston is coming to cardiac rehab post aortic valve replacement. She states she feels like she has been recovering well besides being dizzy at times. They are  working with her blood pressure medications to hopefully manage it better. She enjoys spending time with her horse, dog, and cat as well as staying active. She rides her recumbent bike 5 days a week. When she feels stressed, she takes some B1 and then focuses on doing positive things. She reports no current concerns    Expected Outcomes Short: attend cardiac rehab for education and exercise Long: develop and maitnain positive self care habits.    Continue Psychosocial Services  Follow up required by staff          Psychosocial Re-Evaluation:   Psychosocial Discharge (Final Psychosocial Re-Evaluation):   Vocational Rehabilitation: Provide vocational rehab assistance to qualifying candidates.   Vocational Rehab Evaluation & Intervention:  Vocational Rehab - 03/23/24 1342       Initial Vocational Rehab Evaluation & Intervention   Assessment shows need for Vocational Rehabilitation No          Education: Education Goals: Education classes will be provided on a variety of topics geared toward better understanding of heart health and risk factor modification. Participant will state understanding/return demonstration of topics presented as noted by education test scores.  Learning Barriers/Preferences:  Learning Barriers/Preferences - 03/23/24 1342       Learning Barriers/Preferences   Learning Barriers None    Learning Preferences None          General Cardiac Education Topics:  AED/CPR: - Group verbal and written instruction with the use of models to demonstrate the basic use of the AED with the basic ABC's of resuscitation.   Test and Procedures: - Group verbal and visual presentation and models provide information about basic cardiac anatomy and function. Reviews the testing methods done to diagnose heart disease and the outcomes of the test results. Describes the treatment choices: Medical Management, Angioplasty, or Coronary Bypass Surgery for treating various heart  conditions including Myocardial Infarction, Angina, Valve Disease, and Cardiac Arrhythmias. Written material provided at class time.   Medication Safety: - Group verbal and visual instruction to review commonly prescribed medications for heart and lung disease. Reviews the medication, class of the drug, and side effects. Includes the steps to properly store meds and maintain the prescription regimen. Written material provided at class time.   Intimacy: - Group verbal instruction through game format to discuss how heart and lung disease can affect sexual intimacy. Written material provided at class time.   Know Your Numbers and Heart Failure: - Group verbal and visual instruction to discuss disease risk factors for cardiac and pulmonary disease and treatment options.  Reviews associated  critical values for Overweight/Obesity, Hypertension, Cholesterol, and Diabetes.  Discusses basics of heart failure: signs/symptoms and treatments.  Introduces Heart Failure Zone chart for action plan for heart failure. Written material provided at class time.   Infection Prevention: - Provides verbal and written material to individual with discussion of infection control including proper hand washing and proper equipment cleaning during exercise session. Flowsheet Row Cardiac Rehab from 03/29/2024 in [facility] Cardiac and Pulmonary Rehab  Date 03/29/24  Educator MJC  Instruction Review Code 1- Verbalizes Understanding    Falls Prevention: - Provides verbal and written material to individual with discussion of falls prevention and safety. Flowsheet Row Cardiac Rehab from 03/29/2024 in [facility] Cardiac and Pulmonary Rehab  Date 03/29/24  Educator MJC  Instruction Review Code 1- Verbalizes Understanding    Other: -Provides group and verbal instruction on various topics (see comments)   Knowledge Questionnaire Score:   Core Components/Risk Factors/Patient Goals at Admission:  Personal Goals and Risk Factors at  Admission - 03/23/24 1339       Core Components/Risk Factors/Patient Goals on Admission    Weight Management Weight Loss;Yes    Intervention Weight Management: Develop a combined nutrition and exercise program designed to reach desired caloric intake, while maintaining appropriate intake of nutrient and fiber, sodium and fats, and appropriate energy expenditure required for the weight goal.;Weight Management: Provide education and appropriate resources to help participant work on and attain dietary goals.;Weight Management/Obesity: Establish reasonable short term and long term weight goals.    Goal Weight: Long Term 115 lb (52.2 kg)    Expected Outcomes Short Term: Continue to assess and modify interventions until short term weight is achieved;Long Term: Adherence to nutrition and physical activity/exercise program aimed toward attainment of established weight goal;Weight Loss: Understanding of general recommendations for a balanced deficit meal plan, which promotes 1-2 lb weight loss per week and includes a negative energy balance of [phone] kcal/d;Understanding recommendations for meals to include 15-35% energy as protein, 25-35% energy from fat, 35-60% energy from carbohydrates, less than 200mg  of dietary cholesterol, 20-35 gm of total fiber daily;Understanding of distribution of calorie intake throughout the day with the consumption of 4-5 meals/snacks    Hypertension Yes    Intervention Provide education on lifestyle modifcations including regular physical activity/exercise, weight management, moderate sodium restriction and increased consumption of fresh fruit, vegetables, and low fat dairy, alcohol moderation, and smoking cessation.;Monitor prescription use compliance.    Expected Outcomes Long Term: Maintenance of blood pressure at goal levels.;Short Term: Continued assessment and intervention until BP is < 140/53mm HG in hypertensive participants. < 130/52mm HG in hypertensive participants with  diabetes, heart failure or chronic kidney disease.    Lipids Yes    Intervention Provide education and support for participant on nutrition & aerobic/resistive exercise along with prescribed medications to achieve LDL 70mg , HDL >40mg .    Expected Outcomes Short Term: Participant states understanding of desired cholesterol values and is compliant with medications prescribed. Participant is following exercise prescription and nutrition guidelines.;Long Term: Cholesterol controlled with medications as prescribed, with individualized exercise RX and with personalized nutrition plan. Value goals: LDL < 70mg , HDL > 40 mg.          Education:Diabetes - Individual verbal and written instruction to review signs/symptoms of diabetes, desired ranges of glucose level fasting, after meals and with exercise. Acknowledge that pre and post exercise glucose checks will be done for 3 sessions at entry of program.   Core Components/Risk Factors/Patient Goals Review:    Core  Components/Risk Factors/Patient Goals at Discharge (Final Review):    ITP Comments:  ITP Comments     Row Name 03/23/24 1356 03/29/24 1504         ITP Comments Initial phone call completed. Diagnosis can be found in [facility] 8/26. EP Orientation scheduled for Tuesday 9/23 at 1:30. Completed and gym orientation for cardiac rehab. Initial ITP created and sent for review to Dr. Oneil Pinal, Medical Director.         Comments: Initial ITP

## 2024-03-29 NOTE — Patient Instructions (Signed)
 Patient Instructions  Patient Details  Name: Hannah Willis MRN: [id number] Date of Birth: 04-20-1951 Referring Provider:  Elmira Newman PARAS, MD  Below are your personal goals for exercise, nutrition, and risk factors. Our goal is to help you stay on track towards obtaining and maintaining these goals. We will be discussing your progress on these goals with you throughout the program.  Initial Exercise Prescription:  Initial Exercise Prescription - 03/29/24 1500       Date of Initial Exercise RX and Referring Provider   Date 03/29/24    Referring Provider Dr. Newman Elmira      Oxygen   Maintain Oxygen Saturation 88% or higher      Treadmill   MPH 2.2    Grade 0    Minutes 15    METs 2.68      Recumbant Bike   Level 2    RPM 50    Watts 25    Minutes 15    METs 2.46      NuStep   Level 2    SPM 80    Minutes 15    METs 2.46      T5 Nustep   Level 2    SPM 80    Minutes 15    METs 2.46      Biostep-RELP   Level 2    SPM 50    Minutes 15    METs 2.46      Track   Laps 30    Minutes 15    METs 2.63      Prescription Details   Duration Progress to 30 minutes of continuous aerobic without signs/symptoms of physical distress      Intensity   THRR 40-80% of Max Heartrate 94129    Ratings of Perceived Exertion 11-13    Perceived Dyspnea 0-4      Progression   Progression Continue to progress workloads to maintain intensity without signs/symptoms of physical distress.      Resistance Training   Training Prescription Yes    Weight 2lb    Reps 10-15          Exercise Goals: Frequency: Be able to perform aerobic exercise two to three times per week in program working toward 2-5 days per week of home exercise.  Intensity: Work with a perceived exertion of 11 (fairly light) - 15 (hard) while following your exercise prescription.  We will make changes to your prescription with you as you progress through the program.   Duration: Be able  to do 30 to 45 minutes of continuous aerobic exercise in addition to a 5 minute warm-up and a 5 minute cool-down routine.   Nutrition Goals: Your personal nutrition goals will be established when you do your nutrition analysis with the dietician.  The following are general nutrition guidelines to follow: Cholesterol < 200mg /day Sodium < 1500mg /day Fiber: Women over 50 yrs - 21 grams per day  Personal Goals:  Personal Goals and Risk Factors at Admission - 03/23/24 1339       Core Components/Risk Factors/Patient Goals on Admission    Weight Management Weight Loss;Yes    Intervention Weight Management: Develop a combined nutrition and exercise program designed to reach desired caloric intake, while maintaining appropriate intake of nutrient and fiber, sodium and fats, and appropriate energy expenditure required for the weight goal.;Weight Management: Provide education and appropriate resources to help participant work on and attain dietary goals.;Weight Management/Obesity: Establish reasonable short term and long term weight  goals.    Goal Weight: Long Term 115 lb (52.2 kg)    Expected Outcomes Short Term: Continue to assess and modify interventions until short term weight is achieved;Long Term: Adherence to nutrition and physical activity/exercise program aimed toward attainment of established weight goal;Weight Loss: Understanding of general recommendations for a balanced deficit meal plan, which promotes 1-2 lb weight loss per week and includes a negative energy balance of [phone] kcal/d;Understanding recommendations for meals to include 15-35% energy as protein, 25-35% energy from fat, 35-60% energy from carbohydrates, less than 200mg  of dietary cholesterol, 20-35 gm of total fiber daily;Understanding of distribution of calorie intake throughout the day with the consumption of 4-5 meals/snacks    Hypertension Yes    Intervention Provide education on lifestyle modifcations including regular  physical activity/exercise, weight management, moderate sodium restriction and increased consumption of fresh fruit, vegetables, and low fat dairy, alcohol moderation, and smoking cessation.;Monitor prescription use compliance.    Expected Outcomes Long Term: Maintenance of blood pressure at goal levels.;Short Term: Continued assessment and intervention until BP is < 140/31mm HG in hypertensive participants. < 130/4mm HG in hypertensive participants with diabetes, heart failure or chronic kidney disease.    Lipids Yes    Intervention Provide education and support for participant on nutrition & aerobic/resistive exercise along with prescribed medications to achieve LDL 70mg , HDL >40mg .    Expected Outcomes Short Term: Participant states understanding of desired cholesterol values and is compliant with medications prescribed. Participant is following exercise prescription and nutrition guidelines.;Long Term: Cholesterol controlled with medications as prescribed, with individualized exercise RX and with personalized nutrition plan. Value goals: LDL < 70mg , HDL > 40 mg.          Exercise Goals and Review:  Exercise Goals     Row Name 03/29/24 1510             Exercise Goals   Increase Physical Activity Yes       Intervention Provide advice, education, support and counseling about physical activity/exercise needs.;Develop an individualized exercise prescription for aerobic and resistive training based on initial evaluation findings, risk stratification, comorbidities and participant's personal goals.       Expected Outcomes Long Term: Exercising regularly at least 3-5 days a week.;Long Term: Add in home exercise to make exercise part of routine and to increase amount of physical activity.;Short Term: Attend rehab on a regular basis to increase amount of physical activity.       Increase Strength and Stamina Yes       Intervention Provide advice, education, support and counseling about physical  activity/exercise needs.;Develop an individualized exercise prescription for aerobic and resistive training based on initial evaluation findings, risk stratification, comorbidities and participant's personal goals.       Expected Outcomes Short Term: Increase workloads from initial exercise prescription for resistance, speed, and METs.;Short Term: Perform resistance training exercises routinely during rehab and add in resistance training at home;Long Term: Improve cardiorespiratory fitness, muscular endurance and strength as measured by increased METs and functional capacity ( )       Able to understand and use rate of perceived exertion (RPE) scale Yes       Intervention Provide education and explanation on how to use RPE scale       Expected Outcomes Long Term:  Able to use RPE to guide intensity level when exercising independently;Short Term: Able to use RPE daily in rehab to express subjective intensity level       Able to understand and use  Dyspnea scale Yes       Intervention Provide education and explanation on how to use Dyspnea scale       Expected Outcomes Short Term: Able to use Dyspnea scale daily in rehab to express subjective sense of shortness of breath during exertion;Long Term: Able to use Dyspnea scale to guide intensity level when exercising independently       Knowledge and understanding of Target Heart Rate Range (THRR) Yes       Intervention Provide education and explanation of THRR including how the numbers were predicted and where they are located for reference       Expected Outcomes Short Term: Able to state/look up THRR;Short Term: Able to use daily as guideline for intensity in rehab;Long Term: Able to use THRR to govern intensity when exercising independently       Able to check pulse independently Yes       Intervention Provide education and demonstration on how to check pulse in carotid and radial arteries.;Review the importance of being able to check your own pulse for  safety during independent exercise       Expected Outcomes Short Term: Able to explain why pulse checking is important during independent exercise;Long Term: Able to check pulse independently and accurately       Understanding of Exercise Prescription Yes       Intervention Provide education, explanation, and written materials on patient's individual exercise prescription       Expected Outcomes Short Term: Able to explain program exercise prescription;Long Term: Able to explain home exercise prescription to exercise independently          Copy of goals given to participant.

## 2024-03-30 ENCOUNTER — Encounter: Payer: Self-pay | Admitting: Physician Assistant

## 2024-03-30 ENCOUNTER — Ambulatory Visit: Attending: Cardiology | Admitting: Physician Assistant

## 2024-03-30 VITALS — BP 100/72 | HR 65 | Ht 62.0 in | Wt 136.0 lb

## 2024-03-30 DIAGNOSIS — I351 Nonrheumatic aortic (valve) insufficiency: Secondary | ICD-10-CM

## 2024-03-30 DIAGNOSIS — I5020 Unspecified systolic (congestive) heart failure: Secondary | ICD-10-CM | POA: Diagnosis not present

## 2024-03-30 DIAGNOSIS — E782 Mixed hyperlipidemia: Secondary | ICD-10-CM | POA: Diagnosis not present

## 2024-03-30 DIAGNOSIS — I7121 Aneurysm of the ascending aorta, without rupture: Secondary | ICD-10-CM

## 2024-03-30 DIAGNOSIS — Z952 Presence of prosthetic heart valve: Secondary | ICD-10-CM

## 2024-03-30 DIAGNOSIS — Q2543 Congenital aneurysm of aorta: Secondary | ICD-10-CM

## 2024-03-30 MED ORDER — METOPROLOL SUCCINATE ER 100 MG PO TB24: 150.0000 mg | ORAL_TABLET | Freq: Every day | ORAL | 3 refills | Status: DC

## 2024-03-30 MED ORDER — EMPAGLIFLOZIN 10 MG PO TABS: 10.0000 mg | ORAL_TABLET | Freq: Every day | ORAL | 3 refills | Status: DC

## 2024-03-30 MED ORDER — METOPROLOL TARTRATE 75 MG PO TABS: 75.0000 mg | ORAL_TABLET | Freq: Two times a day (BID) | ORAL | 3 refills | Status: DC

## 2024-03-30 MED ORDER — AMIODARONE HCL 200 MG PO TABS: 200.0000 mg | ORAL_TABLET | Freq: Every day | ORAL | 3 refills | Status: DC

## 2024-03-30 NOTE — Progress Notes (Signed)
 Cardiology Office Note   Date:  03/30/2024  ID:  Hannah Willis, DOB 1950-07-30, MRN [id number] PCP: Hannah Pellet, MD  [facility] Providers Cardiologist:  Newman JINNY Lawrence, MD   History of Present Illness Hannah Willis is a 73 y.o. female with aortic valve replacement and atrial fibrillation who presents for follow-up regarding her heart function and medication management. She was referred by Dr. Jerral for follow-up after her recent echocardiogram.  The echocardiogram on March 23, 2024, shows mildly reduced heart pump function. Seven weeks ago, she underwent aortic valve and aorta replacement with a 25 mm Edwards valve, resolving aortic stenosis. Mild to moderate mitral valve prolapse with leakage is present but did not require intervention.  She experiences ongoing atrial fibrillation, which is symptomatic. Her current medications include amiodarone  and Eliquis . Her blood pressure at home is typically 128/70 mmHg.  She experiences chest pressure and a burning sensation, described as 'barbed wire,' managed with an ice pack at night. Antacids like Prilosec and Tums provide some relief.  She is starting cardiac rehab next week at [facility] and has been cleared to drive.  Reports no shortness of breath nor dyspnea on exertion. Reports no chest pain, pressure, or tightness. No edema, orthopnea, PND. Reports no palpitations.    Discussed the use of AI scribe software for clinical note transcription with the patient, who gave verbal consent to proceed.  ROS: Pertinent ROS in HPI  Studies Reviewed EKG Interpretation Date/Time:  Wednesday March 30 2024 15:46:41 EDT Ventricular Rate:  60 PR Interval:  194 QRS Duration:  106 QT Interval:  446 QTC Calculation: 446 R Axis:   -7  Text Interpretation: Normal sinus rhythm Incomplete left bundle branch block Left ventricular hypertrophy with repolarization abnormality ( Cornell product ) When compared with ECG  of 01-Mar-2024 10:04, Sinus rhythm has replaced Atrial fibrillation T wave inversion less evident in Inferior leads Confirmed by Lucien Blanc [phone]) on 03/30/2024 7:19:53 PM    Intraoperative TEE 02/2020: POST-OP IMPRESSIONS  _ Left Ventricle: has low normal systolic function, estimated at 45-50%.  _ Right Ventricle: normal function.  _ Aorta: A graft was placed in the ascending aorta for repair.  _ Left Atrial Appendage: The left atrial appendage appears unchanged from pre-bypass.  _ Aortic Valve: Status post bioprosthetic aortic valve replacement, with  Madeline function of the prosthesis. No paravalvular or intravalvular leak  visualized. Trans-aortic valvular mean gradient .  _ Mitral Valve: The mitral valve appears unchanged from pre-bypass. There is moderate regurgitation.  _ Tricuspid Valve: The tricuspid valve appears unchanged from pre-bypass. There is mild regurgitation.  _ Pulmonic Valve: The pulmonic valve appears unchanged from pre-bypass.  _ Interatrial Septum: The interatrial septum appears unchanged from  pre-bypass.  _ Pericardium: The pericardium appears unchanged from pre-bypass.  _ Comments: No significant pleural or pericardial effusion.    CTA aorta 01/2024: 1. 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 2. No effacement of the sino-tubular junction and mildly ectatic but nonaneurysmal ascending aorta with normal transverse and descending thoracic aorta diameters. 3. Aortic and coronary artery atherosclerotic vascular calcifications. 4. Four vessel arch anatomy. The left vertebral artery arises directly from the aorta. 5. Cardiomegaly with prominent left atrial enlargement and mild left ventricular dilatation. 6. Nonobstructing left lower pole nephrolithiasis. 7. Multilevel degenerative disc disease.  Coronary angiography 12/25/2023: LM: Normal LAD: Prox 30% , distal 40% disease Ramus: Small, no significant  disease Lcx: No significant disease  RCA: Large, dominant          RPLA focal 40% disease   LVEDP 30 mmHg   Right heart catheterization 12/25/2023: RA: 7 mmHg RV: 27/3 mmHg PA: 30/15 mmHg, mPAP 16 mmHg PCW: 12 mmHg   AO sats: 97% PA sats: 69%   CO: 3.9 L/min CI: 2.3 L/min/m2   Mild nonobstructive coronary artery disease Elevated LVEDP in the setting of severe aortic regurgitation and reduced LVEF Refer to cardiothoracic surgery for consideration for aortic valve and possible aortic root replacement     TEE 12/2023: 1. AI is central with eccentric course; likely due to dilated aortic  root.   2. Left ventricular ejection fraction, by estimation, is 45 to 50%. The  left ventricle has mildly decreased function. The left ventricle  demonstrates global hypokinesis. The left ventricular internal cavity size  was moderately dilated.   3. Right ventricular systolic function is normal. The right ventricular  size is normal.   4. Left atrial size was severely dilated. No left atrial/left atrial  appendage thrombus was detected.   5. 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 There is moderate holosystolic prolapse of the middle  segment of the anterior leaflet of the mitral valve.   6. The aortic valve is tricuspid. Aortic valve regurgitation is severe.  Aortic valve sclerosis is present, with no evidence of aortic valve  stenosis.   7. Aortic dilatation noted. There is moderate dilatation of the aortic  root, measuring 47 mm. There is Moderate (Grade III) plaque involving the  descending aorta.   8. 3D performed of the mitral valve and 3D performed of the aortic valve.    Echocardiogram 12/07/2023:  1. Left ventricular ejection fraction, by estimation, is 45 to 50%. The  left ventricle has mildly decreased function. The left ventricle  demonstrates global hypokinesis. The left ventricular internal cavity size  was mildly dilated. There is mild  eccentric left ventricular hypertrophy.  Left ventricular diastolic parameters are consistent with Grade II diastolic dysfunction  (pseudonormalization). Elevated left atrial pressure.   2. Right ventricular systolic function is normal. The right ventricular  size is normal. There is normal pulmonary artery systolic pressure. The  estimated right ventricular systolic pressure is 35.3 mmHg.   3. Left atrial size was severely dilated.   4. 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 No evidence of mitral stenosis. There is severe  holosystolic prolapse of the middle segment of the anterior leaflet of the  mitral valve.   5. The aortic valve is tricuspid. Aortic valve regurgitation is moderate  to severe. No aortic stenosis is present. Aortic regurgitation PHT  measures 276 msec.   6. Aortic dilatation noted. There is moderate dilatation of the aortic  root, measuring 45 mm. There is mild dilatation of the ascending aorta,  measuring 41 mm.   7. The inferior vena cava is normal in size with greater than 50%  respiratory variability, suggesting right atrial pressure of 3 mmHg.   Comparison(s): Prior images unable to be directly viewed, comparison made  by report only. Aortic insufficiency is a new dominant valvular  abnormality and the left ventricle has dilated and has reduced systolic  fumnction. 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    Risk Assessment/Calculations  CHA2DS2-VASc Score = 5   This indicates a 7.2% annual risk of stroke. The patient's score is based upon: CHF History: 1 HTN History: 1 Diabetes  History: 0 Stroke History: 0 Vascular Disease History: 1 Age Score: 1 Gender Score: 1            Physical Exam VS:  BP 100/72 (BP Location: Right Arm, Patient Position: Sitting, Cuff Size: Normal)   Pulse 65   Ht 5' 2 (1.575 m)   Wt 136 lb (61.7 kg)   SpO2 94%   BMI 24.87 kg/m        Wt Readings from Last  3 Encounters:  03/30/24 136 lb (61.7 kg)  03/29/24 135 lb 11.2 oz (61.6 kg)  03/09/24 134 lb (60.8 kg)    GEN: Well nourished, well developed in no acute distress NECK: No JVD; No carotid bruits CARDIAC: IRIR, no murmurs, rubs, gallops RESPIRATORY:  Clear to auscultation without rales, wheezing or rhonchi  ABDOMEN: Soft, non-tender, non-distended EXTREMITIES:  No edema; No deformity   ASSESSMENT AND PLAN  Atrial fibrillation Intermittent post-surgical AFib with symptoms. Common post-surgery, may resolve as heart heals. - Continue amiodarone  and Eliquis . - Perform EKG to confirm AFib status. - Monitor symptoms and adjust treatment.  Heart failure with mildly reduced ejection fraction Echocardiogram shows mildly reduced ejection fraction (40-45%), decreased from June. Heart function may improve post-surgery. Guideline-directed medical therapy considered. - Switch metoprolol  tartrate to metoprolol  succinate 150 mg once daily. - Start Jardiance . Monitor for urinary tract and yeast infections. - Monitor blood pressure and heart function.  Status post aortic valve and ascending aorta replacement Seven weeks post-surgery, no further aortic stenosis. Incision healing well. - Schedule follow-up echocardiogram.  Mitral valve prolapse with mild to moderate regurgitation Mitral valve prolapse with mild to moderate regurgitation, no immediate intervention required. - Monitor for symptom changes.  Mixed hyperlipidemia Cholesterol levels well-controlled with LDL at 50.  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 - Try over-the-counter antihistamines like Allegra or Zyrtec. - Avoid decongestants with 'D'.   Dispo: Keep October appointment with Dr. Elmira  Signed, Orren LOISE Fabry, PA-C

## 2024-03-30 NOTE — Patient Instructions (Addendum)
 Medication Instructions:  START TAKING JARDIANCE  10 MG DAILY. START TAKING METOPROLOL  SUCCINATE 150 MG (1 AND 1/2 TABLETS) DAILY. STOP TAKING METOPROLOL  TARTRATE.  Lab Work: BMP TO BE DONE IN 1 WEEK.   Testing/Procedures: TO BE DONE IN 3 MONTHS  Your physician has requested that you have an echocardiogram. Echocardiography is a painless test that uses sound waves to create images of your heart. It provides your doctor with information about the size and shape of your heart and how well your heart's chambers and valves are working. This procedure takes approximately one hour. There are no restrictions for this procedure. Please do NOT wear cologne, perfume, aftershave, or lotions (deodorant is allowed). Please arrive 15 minutes prior to your appointment time.  Please note: We ask at that you not bring children with you during ultrasound (echo/ vascular) testing. Due to room size and safety concerns, children are not allowed in the ultrasound rooms during exams. Our front office staff cannot provide observation of children in our lobby area while testing is being conducted. An adult accompanying a patient to their appointment will only be allowed in the ultrasound room at the discretion of the ultrasound technician under special circumstances. We apologize for any inconvenience.    Follow-Up: At [facility], you and your health needs are our priority.  As part of our continuing mission to provide you with exceptional heart care, our providers are all part of one team.  This team includes your primary Cardiologist (physician) and Advanced Practice Providers or APPs (Physician Assistants and Nurse Practitioners) who all work together to provide you with the care you need, when you need it.  Your next appointment:   KEEP OCTOBER APPOINTMENT WITH DR. PATWARDHAN Provider:   Newman JINNY Lawrence, MD

## 2024-03-30 NOTE — Progress Notes (Signed)
 Cardiac Individual Treatment Plan  Patient Details  Name: Hannah Willis MRN: [id number] Date of Birth: Sep 03, 1950 Referring Provider:   Flowsheet Row Cardiac Rehab from 03/29/2024 in [facility] Cardiac and Pulmonary Rehab  Referring Provider Dr. Newman Lawrence    Initial Encounter Date:  Flowsheet Row Cardiac Rehab from 03/29/2024 in [facility] Cardiac and Pulmonary Rehab  Date 03/29/24    Visit Diagnosis: S/P aortic valve replacement  Patient's Home Medications on Admission:  Current Outpatient Medications:    acetaminophen  (TYLENOL ) 500 MG tablet, Take 1-2 tablets (500-1,000 mg total) by mouth every 6 (six) hours as needed., Disp: , Rfl:    alendronate (FOSAMAX) 70 MG tablet, Take 70 mg by mouth once a week. Take with a full glass of water  on an empty stomach., Disp: , Rfl:    amiodarone  (PACERONE ) 200 MG tablet, Take 2 tablets by mouth twice per day for 7 days, then decrease to 1 tablet twice per day for 7 days, then decrease to 1 tablet daily thereafter, Disp: 60 tablet, Rfl: 1   apixaban  (ELIQUIS ) 5 MG TABS tablet, Take 1 tablet (5 mg total) by mouth 2 (two) times daily., Disp: 180 tablet, Rfl: 1   Apoaequorin (PREVAGEN PO), Take 1 tablet by mouth daily. (Patient not taking: Reported on 03/09/2024), Disp: , Rfl:    calcium  carbonate (OSCAL) 1500 (600 Ca) MG TABS tablet, Take by mouth 2 (two) times daily with a meal. (Patient not taking: Reported on 03/09/2024), Disp: , Rfl:    ferrous sulfate  325 (65 FE) MG tablet, Take 325 mg by mouth daily. (Patient not taking: Reported on 03/09/2024), Disp: , Rfl:    Metoprolol  Tartrate 75 MG TABS, Take 1 tablet (75 mg total) by mouth 2 (two) times daily., Disp: 180 tablet, Rfl: 3   ondansetron  (ZOFRAN ) 4 MG tablet, Take 1 tablet (4 mg total) by mouth every 8 (eight) hours as needed for nausea or vomiting., Disp: 90 tablet, Rfl: 1   rosuvastatin  (CRESTOR ) 20 MG tablet, Take 20 mg by mouth daily., Disp: , Rfl:    thiamine  (VITAMIN B-1) 100 MG tablet, Take  100 mg by mouth daily. (Patient not taking: Reported on 03/09/2024), Disp: , Rfl:    traMADol  (ULTRAM ) 50 MG tablet, Take 1 tablet (50 mg total) by mouth every 6 (six) hours as needed for severe pain (pain score 7-10)., Disp: 28 tablet, Rfl: 0   Vedolizumab  (ENTYVIO  IV), Inject 1 Dose as directed every 8 (eight) weeks. Experimental every two months, given at the MD office (Patient not taking: Reported on 03/09/2024), Disp: , Rfl:    VITAMIN D  PO, Take 1 tablet by mouth daily. (Patient not taking: Reported on 03/09/2024), Disp: , Rfl:   Past Medical History: Past Medical History:  Diagnosis Date   Allergy    Apnea, sleep    no c-pap machine   Arthritis    OA   Basal cell carcinoma    on front chest   Blood in stool    LAST 2 MONTHS, MEDS CHNAGED FOR   GERD (gastroesophageal reflux disease)    Heart murmur    HFrEF (heart failure with reduced ejection fraction) (HCC) 01/20/2024   Hyperlipidemia    Hypertension    Kidney stones    Laryngitis    started monday night   Neuromuscular disorder (HCC)    Post-operative nausea and vomiting    Severe aortic insufficiency    Severe aortic regurgitation 12/21/2023   Spherocytosis (familial)    Stroke (HCC) 09/21/2007  Synovitis of right hand 12/29/2015   and wrist   Thoracic aortic aneurysm    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   Ulcerative colitis (HCC)    Weakness of right hand    due to stroke    Tobacco Use: Social History   Tobacco Use  Smoking Status Former   Current packs/day: 0.00   Types: Cigarettes   Quit date: 06/20/1989   Years since quitting: 34.8  Smokeless Tobacco Never  Tobacco Comments   smoked occasional cigarettes for 5 years    Labs: Review Flowsheet  More data may exist      Latest Ref Rng & Units 09/21/2007 04/04/2015 12/25/2023 02/05/2024 02/08/2024  Labs for ITP Cardiac and Pulmonary Rehab  Cholestrol 100 - 199 mg/dL 821        ATP III CLASSIFICATION:  <200     mg/dL   Desirable  799-760  mg/dL    Borderline High  >=759    mg/dL   High  798  - - -  LDL (calc) 0 - 99 mg/dL 885        Total Cholesterol/HDL:CHD Risk Coronary Heart Disease Risk Table                     Men   Women  1/2 Average Risk   3.4   3.3  114  - - -  HDL-C >39 mg/dL 54  69  - - -  Trlycerides 0 - 149 mg/dL 51  89  - - -  Hemoglobin A1c 4.8 - 5.6 % - 5.4  - 5.4  -  PH, Arterial 7.35 - 7.45 - - 7.432  - 7.375  7.364  7.381  7.433  7.467  7.484   PCO2 arterial 32 - 48 mmHg - - 33.0  - 36.8  40.3  35.7  33.3  32.2  28.5   Bicarbonate 20.0 - 28.0 mmol/L - - 23.7  23.9  22.0  - 21.4  22.8  21.6  22.3  23.3  23.3  21.4   TCO2 22 - 32 mmol/L - - 25  25  23   - 23  24  23  23  26  27  24  26  26  27  24  22  22  22    Acid-base deficit 0.0 - 2.0 mmol/L - - 1.0  1.0  2.0  - 3.0  2.0  4.0  2.0  1.0  2.0   O2 Saturation % - - 66  72  97  - 97  97  97  100  100  72  100     Details       Multiple values from one day are sorted in reverse-chronological order          Exercise Target Goals: Exercise Program Goal: Individual exercise prescription set using results from initial 6 min walk test and THRR while considering  patient's activity barriers and safety.   Exercise Prescription Goal: Initial exercise prescription builds to 30-45 minutes a day of aerobic activity, 2-3 days per week.  Home exercise guidelines will be given to patient during program as part of exercise prescription that the participant will acknowledge.   Education: Aerobic Exercise: - Group verbal and visual presentation on the components of exercise prescription. Introduces F.I.T.T principle from ACSM for exercise prescriptions.  Reviews F.I.T.T. principles of aerobic exercise including progression. Written material provided at class time.   Education: Resistance Exercise: - Group verbal and visual presentation on  the components of exercise prescription. Introduces F.I.T.T principle from ACSM for exercise prescriptions  Reviews F.I.T.T. principles  of resistance exercise including progression. Written material provided at class time.    Education: Exercise & Equipment Safety: - Individual verbal instruction and demonstration of equipment use and safety with use of the equipment. Flowsheet Row Cardiac Rehab from 03/29/2024 in [facility] Cardiac and Pulmonary Rehab  Date 03/29/24  Educator MJC  Instruction Review Code 1- Verbalizes Understanding    Education: Exercise Physiology & General Exercise Guidelines: - Group verbal and written instruction with models to review the exercise physiology of the cardiovascular system and associated critical values. Provides general exercise guidelines with specific guidelines to those with heart or lung disease. Written material provided at class time.   Education: Flexibility, Balance, Mind/Body Relaxation: - Group verbal and visual presentation with interactive activity on the components of exercise prescription. Introduces F.I.T.T principle from ACSM for exercise prescriptions. Reviews F.I.T.T. principles of flexibility and balance exercise training including progression. Also discusses the mind body connection.  Reviews various relaxation techniques to help reduce and manage stress (i.e. Deep breathing, progressive muscle relaxation, and visualization). Balance handout provided to take home. Written material provided at class time.   Activity Barriers & Risk Stratification:  Activity Barriers & Cardiac Risk Stratification - 03/29/24 1506       Activity Barriers & Cardiac Risk Stratification   Activity Barriers Left Knee Replacement;Right Knee Replacement;Joint Problems    Cardiac Risk Stratification Moderate          6 Minute Walk:  6 Minute Walk     Row Name 03/29/24 1504         6 Minute Walk   Phase Initial     Distance 1135 feet     Walk Time 6 minutes     # of Rest Breaks 0     MPH 2.14     METS 2.46     RPE 11     Perceived Dyspnea  1     VO2 Peak 8.6     Symptoms No      Resting HR 60 bpm     Resting BP 126/68     Resting Oxygen Saturation  90 %     Exercise Oxygen Saturation  during 6 min walk 90 %     Max Ex. HR 83 bpm     Max Ex. BP 148/64     2 Minute Post BP 124/62        Oxygen Initial Assessment:   Oxygen Re-Evaluation:   Oxygen Discharge (Final Oxygen Re-Evaluation):   Initial Exercise Prescription:  Initial Exercise Prescription - 03/29/24 1500       Date of Initial Exercise RX and Referring Provider   Date 03/29/24    Referring Provider Dr. Newman Lawrence      Oxygen   Maintain Oxygen Saturation 88% or higher      Treadmill   MPH 2.2    Grade 0    Minutes 15    METs 2.68      Recumbant Bike   Level 2    RPM 50    Watts 25    Minutes 15    METs 2.46      NuStep   Level 2    SPM 80    Minutes 15    METs 2.46      T5 Nustep   Level 2    SPM 80    Minutes 15    METs  2.46      Biostep-RELP   Level 2    SPM 50    Minutes 15    METs 2.46      Track   Laps 30    Minutes 15    METs 2.63      Prescription Details   Duration Progress to 30 minutes of continuous aerobic without signs/symptoms of physical distress      Intensity   THRR 40-80% of Max Heartrate 94129    Ratings of Perceived Exertion 11-13    Perceived Dyspnea 0-4      Progression   Progression Continue to progress workloads to maintain intensity without signs/symptoms of physical distress.      Resistance Training   Training Prescription Yes    Weight 2lb    Reps 10-15          Perform Capillary Blood Glucose checks as needed.  Exercise Prescription Changes:   Exercise Prescription Changes     Row Name 03/29/24 1500             Response to Exercise   Blood Pressure (Admit) 126/68       Blood Pressure (Exercise) 148/64       Blood Pressure (Exit) 124/62       Heart Rate (Admit) 60 bpm       Heart Rate (Exercise) 83 bpm       Heart Rate (Exit) 59 bpm       Oxygen Saturation (Admit) 90 %       Oxygen Saturation  (Exercise) 90 %       Oxygen Saturation (Exit) 90 %       Rating of Perceived Exertion (Exercise) 11       Perceived Dyspnea (Exercise) 1       Symptoms none       Comments results          Exercise Comments:   Exercise Goals and Review:   Exercise Goals     Row Name 03/29/24 1510             Exercise Goals   Increase Physical Activity Yes       Intervention Provide advice, education, support and counseling about physical activity/exercise needs.;Develop an individualized exercise prescription for aerobic and resistive training based on initial evaluation findings, risk stratification, comorbidities and participant's personal goals.       Expected Outcomes Long Term: Exercising regularly at least 3-5 days a week.;Long Term: Add in home exercise to make exercise part of routine and to increase amount of physical activity.;Short Term: Attend rehab on a regular basis to increase amount of physical activity.       Increase Strength and Stamina Yes       Intervention Provide advice, education, support and counseling about physical activity/exercise needs.;Develop an individualized exercise prescription for aerobic and resistive training based on initial evaluation findings, risk stratification, comorbidities and participant's personal goals.       Expected Outcomes Short Term: Increase workloads from initial exercise prescription for resistance, speed, and METs.;Short Term: Perform resistance training exercises routinely during rehab and add in resistance training at home;Long Term: Improve cardiorespiratory fitness, muscular endurance and strength as measured by increased METs and functional capacity ( )       Able to understand and use rate of perceived exertion (RPE) scale Yes       Intervention Provide education and explanation on how to use RPE scale       Expected Outcomes  Long Term:  Able to use RPE to guide intensity level when exercising independently;Short Term: Able to  use RPE daily in rehab to express subjective intensity level       Able to understand and use Dyspnea scale Yes       Intervention Provide education and explanation on how to use Dyspnea scale       Expected Outcomes Short Term: Able to use Dyspnea scale daily in rehab to express subjective sense of shortness of breath during exertion;Long Term: Able to use Dyspnea scale to guide intensity level when exercising independently       Knowledge and understanding of Target Heart Rate Range (THRR) Yes       Intervention Provide education and explanation of THRR including how the numbers were predicted and where they are located for reference       Expected Outcomes Short Term: Able to state/look up THRR;Short Term: Able to use daily as guideline for intensity in rehab;Long Term: Able to use THRR to govern intensity when exercising independently       Able to check pulse independently Yes       Intervention Provide education and demonstration on how to check pulse in carotid and radial arteries.;Review the importance of being able to check your own pulse for safety during independent exercise       Expected Outcomes Short Term: Able to explain why pulse checking is important during independent exercise;Long Term: Able to check pulse independently and accurately       Understanding of Exercise Prescription Yes       Intervention Provide education, explanation, and written materials on patient's individual exercise prescription       Expected Outcomes Short Term: Able to explain program exercise prescription;Long Term: Able to explain home exercise prescription to exercise independently          Exercise Goals Re-Evaluation :   Discharge Exercise Prescription (Final Exercise Prescription Changes):  Exercise Prescription Changes - 03/29/24 1500       Response to Exercise   Blood Pressure (Admit) 126/68    Blood Pressure (Exercise) 148/64    Blood Pressure (Exit) 124/62    Heart Rate (Admit) 60 bpm     Heart Rate (Exercise) 83 bpm    Heart Rate (Exit) 59 bpm    Oxygen Saturation (Admit) 90 %    Oxygen Saturation (Exercise) 90 %    Oxygen Saturation (Exit) 90 %    Rating of Perceived Exertion (Exercise) 11    Perceived Dyspnea (Exercise) 1    Symptoms none    Comments results          Nutrition:  Target Goals: Understanding of nutrition guidelines, daily intake of sodium 1500mg , cholesterol 200mg , calories 30% from fat and 7% or less from saturated fats, daily to have 5 or more servings of fruits and vegetables.  Education: Nutrition 1 -Group instruction provided by verbal, written material, interactive activities, discussions, models, and posters to present general guidelines for heart healthy nutrition including macronutrients, label reading, and promoting whole foods over processed counterparts. Education serves as Pensions consultant of discussion of heart healthy eating for all. Written material provided at class time.    Education: Nutrition 2 -Group instruction provided by verbal, written material, interactive activities, discussions, models, and posters to present general guidelines for heart healthy nutrition including sodium, cholesterol, and saturated fat. Providing guidance of habit forming to improve blood pressure, cholesterol, and body weight. Written material provided at class time.  Biometrics:  Pre Biometrics - 03/29/24 1510       Pre Biometrics   Height 5' 2.6 (1.59 m)    Weight 135 lb 11.2 oz (61.6 kg)    Waist Circumference 32 inches    Hip Circumference 35.5 inches    Waist to Hip Ratio 0.9 %    BMI (Calculated) 24.35    Single Leg Stand 7 seconds           Nutrition Therapy Plan and Nutrition Goals:   Nutrition Assessments:  MEDIFICTS Score Key: >=70 Need to make dietary changes  40-70 Heart Healthy Diet <= 40 Therapeutic Level Cholesterol Diet   Picture Your Plate Scores: <59 Unhealthy dietary pattern with much room for  improvement. 41-50 Dietary pattern unlikely to meet recommendations for good health and room for improvement. 51-60 More healthful dietary pattern, with some room for improvement.  >60 Healthy dietary pattern, although there may be some specific behaviors that could be improved.    Nutrition Goals Re-Evaluation:   Nutrition Goals Discharge (Final Nutrition Goals Re-Evaluation):   Psychosocial: Target Goals: Acknowledge presence or absence of significant depression and/or stress, maximize coping skills, provide positive support system. Participant is able to verbalize types and ability to use techniques and skills needed for reducing stress and depression.   Education: Stress, Anxiety, and Depression - Group verbal and visual presentation to define topics covered.  Reviews how body is impacted by stress, anxiety, and depression.  Also discusses healthy ways to reduce stress and to treat/manage anxiety and depression. Written material provided at class time.   Education: Sleep Hygiene -Provides group verbal and written instruction about how sleep can affect your health.  Define sleep hygiene, discuss sleep cycles and impact of sleep habits. Review good sleep hygiene tips.   Initial Review & Psychosocial Screening:  Initial Psych Review & Screening - 03/23/24 1343       Initial Review   Current issues with None Identified      Family Dynamics   Good Support System? Yes      Barriers   Psychosocial barriers to participate in program There are no identifiable barriers or psychosocial needs.;The patient should benefit from training in stress management and relaxation.      Screening Interventions   Interventions Encouraged to exercise;Provide feedback about the scores to participant;To provide support and resources with identified psychosocial needs    Expected Outcomes Long Term Goal: Stressors or current issues are controlled or eliminated.;Short Term goal: Utilizing psychosocial  counselor, staff and physician to assist with identification of specific Stressors or current issues interfering with healing process. Setting desired goal for each stressor or current issue identified.;Short Term goal: Identification and review with participant of any Quality of Life or Depression concerns found by scoring the questionnaire.;Long Term goal: The participant improves quality of Life and PHQ9 Scores as seen by post scores and/or verbalization of changes          Quality of Life Scores:   Scores of 19 and below usually indicate a poorer quality of life in these areas.  A difference of  2-3 points is a clinically meaningful difference.  A difference of 2-3 points in the total score of the Quality of Life Index has been associated with significant improvement in overall quality of life, self-image, physical symptoms, and general health in studies assessing change in quality of life.  PHQ-9: Review Flowsheet       03/29/2024  Depression screen PHQ 2/9  Decreased Interest 0  Down, Depressed, Hopeless 0  PHQ - 2 Score 0  Altered sleeping 2  Tired, decreased energy 1  Change in appetite 0  Feeling bad or failure about yourself  0  Trouble concentrating 0  Moving slowly or fidgety/restless 0  Suicidal thoughts 0  PHQ-9 Score 3  Difficult doing work/chores Not difficult at all   Interpretation of Total Score  Total Score Depression Severity:  1-4 = Minimal depression, 5-9 = Mild depression, 10-14 = Moderate depression, 15-19 = Moderately severe depression, 20-27 = Severe depression   Psychosocial Evaluation and Intervention:  Psychosocial Evaluation - 03/23/24 1352       Psychosocial Evaluation & Interventions   Interventions Encouraged to exercise with the program and follow exercise prescription    Comments Ms. Meritt is coming to cardiac rehab post aortic valve replacement. She states she feels like she has been recovering well besides being dizzy at times. They are  working with her blood pressure medications to hopefully manage it better. She enjoys spending time with her horse, dog, and cat as well as staying active. She rides her recumbent bike 5 days a week. When she feels stressed, she takes some B1 and then focuses on doing positive things. She reports no current concerns    Expected Outcomes Short: attend cardiac rehab for education and exercise Long: develop and maitnain positive self care habits.    Continue Psychosocial Services  Follow up required by staff          Psychosocial Re-Evaluation:   Psychosocial Discharge (Final Psychosocial Re-Evaluation):   Vocational Rehabilitation: Provide vocational rehab assistance to qualifying candidates.   Vocational Rehab Evaluation & Intervention:  Vocational Rehab - 03/23/24 1342       Initial Vocational Rehab Evaluation & Intervention   Assessment shows need for Vocational Rehabilitation No          Education: Education Goals: Education classes will be provided on a variety of topics geared toward better understanding of heart health and risk factor modification. Participant will state understanding/return demonstration of topics presented as noted by education test scores.  Learning Barriers/Preferences:  Learning Barriers/Preferences - 03/23/24 1342       Learning Barriers/Preferences   Learning Barriers None    Learning Preferences None          General Cardiac Education Topics:  AED/CPR: - Group verbal and written instruction with the use of models to demonstrate the basic use of the AED with the basic ABC's of resuscitation.   Test and Procedures: - Group verbal and visual presentation and models provide information about basic cardiac anatomy and function. Reviews the testing methods done to diagnose heart disease and the outcomes of the test results. Describes the treatment choices: Medical Management, Angioplasty, or Coronary Bypass Surgery for treating various heart  conditions including Myocardial Infarction, Angina, Valve Disease, and Cardiac Arrhythmias. Written material provided at class time.   Medication Safety: - Group verbal and visual instruction to review commonly prescribed medications for heart and lung disease. Reviews the medication, class of the drug, and side effects. Includes the steps to properly store meds and maintain the prescription regimen. Written material provided at class time.   Intimacy: - Group verbal instruction through game format to discuss how heart and lung disease can affect sexual intimacy. Written material provided at class time.   Know Your Numbers and Heart Failure: - Group verbal and visual instruction to discuss disease risk factors for cardiac and pulmonary disease and treatment options.  Reviews associated  critical values for Overweight/Obesity, Hypertension, Cholesterol, and Diabetes.  Discusses basics of heart failure: signs/symptoms and treatments.  Introduces Heart Failure Zone chart for action plan for heart failure. Written material provided at class time.   Infection Prevention: - Provides verbal and written material to individual with discussion of infection control including proper hand washing and proper equipment cleaning during exercise session. Flowsheet Row Cardiac Rehab from 03/29/2024 in [facility] Cardiac and Pulmonary Rehab  Date 03/29/24  Educator MJC  Instruction Review Code 1- Verbalizes Understanding    Falls Prevention: - Provides verbal and written material to individual with discussion of falls prevention and safety. Flowsheet Row Cardiac Rehab from 03/29/2024 in [facility] Cardiac and Pulmonary Rehab  Date 03/29/24  Educator MJC  Instruction Review Code 1- Verbalizes Understanding    Other: -Provides group and verbal instruction on various topics (see comments)   Knowledge Questionnaire Score:   Core Components/Risk Factors/Patient Goals at Admission:  Personal Goals and Risk Factors at  Admission - 03/23/24 1339       Core Components/Risk Factors/Patient Goals on Admission    Weight Management Weight Loss;Yes    Intervention Weight Management: Develop a combined nutrition and exercise program designed to reach desired caloric intake, while maintaining appropriate intake of nutrient and fiber, sodium and fats, and appropriate energy expenditure required for the weight goal.;Weight Management: Provide education and appropriate resources to help participant work on and attain dietary goals.;Weight Management/Obesity: Establish reasonable short term and long term weight goals.    Goal Weight: Long Term 115 lb (52.2 kg)    Expected Outcomes Short Term: Continue to assess and modify interventions until short term weight is achieved;Long Term: Adherence to nutrition and physical activity/exercise program aimed toward attainment of established weight goal;Weight Loss: Understanding of general recommendations for a balanced deficit meal plan, which promotes 1-2 lb weight loss per week and includes a negative energy balance of [id number] kcal/d;Understanding recommendations for meals to include 15-35% energy as protein, 25-35% energy from fat, 35-60% energy from carbohydrates, less than 200mg  of dietary cholesterol, 20-35 gm of total fiber daily;Understanding of distribution of calorie intake throughout the day with the consumption of 4-5 meals/snacks    Hypertension Yes    Intervention Provide education on lifestyle modifcations including regular physical activity/exercise, weight management, moderate sodium restriction and increased consumption of fresh fruit, vegetables, and low fat dairy, alcohol moderation, and smoking cessation.;Monitor prescription use compliance.    Expected Outcomes Long Term: Maintenance of blood pressure at goal levels.;Short Term: Continued assessment and intervention until BP is < 140/39mm HG in hypertensive participants. < 130/79mm HG in hypertensive participants with  diabetes, heart failure or chronic kidney disease.    Lipids Yes    Intervention Provide education and support for participant on nutrition & aerobic/resistive exercise along with prescribed medications to achieve LDL 70mg , HDL >40mg .    Expected Outcomes Short Term: Participant states understanding of desired cholesterol values and is compliant with medications prescribed. Participant is following exercise prescription and nutrition guidelines.;Long Term: Cholesterol controlled with medications as prescribed, with individualized exercise RX and with personalized nutrition plan. Value goals: LDL < 70mg , HDL > 40 mg.          Education:Diabetes - Individual verbal and written instruction to review signs/symptoms of diabetes, desired ranges of glucose level fasting, after meals and with exercise. Acknowledge that pre and post exercise glucose checks will be done for 3 sessions at entry of program.   Core Components/Risk Factors/Patient Goals Review:    Core  Components/Risk Factors/Patient Goals at Discharge (Final Review):    ITP Comments:  ITP Comments     Row Name 03/23/24 1356 03/29/24 1504 03/30/24 1153       ITP Comments Initial phone call completed. Diagnosis can be found in [facility] 8/26. EP Orientation scheduled for Tuesday 9/23 at 1:30. Completed and gym orientation for cardiac rehab. Initial ITP created and sent for review to Dr. Oneil Pinal, Medical Director. 30 Day review completed. Medical Director ITP review done; changes made as directed and signed approval by Medical Director. New to program.        Comments: 30 day review

## 2024-03-31 ENCOUNTER — Encounter

## 2024-04-05 ENCOUNTER — Encounter

## 2024-04-05 DIAGNOSIS — I5020 Unspecified systolic (congestive) heart failure: Secondary | ICD-10-CM | POA: Diagnosis not present

## 2024-04-05 DIAGNOSIS — I7121 Aneurysm of the ascending aorta, without rupture: Secondary | ICD-10-CM | POA: Diagnosis not present

## 2024-04-05 DIAGNOSIS — I351 Nonrheumatic aortic (valve) insufficiency: Secondary | ICD-10-CM | POA: Diagnosis not present

## 2024-04-05 DIAGNOSIS — Z952 Presence of prosthetic heart valve: Secondary | ICD-10-CM

## 2024-04-05 DIAGNOSIS — Z48812 Encounter for surgical aftercare following surgery on the circulatory system: Secondary | ICD-10-CM | POA: Diagnosis not present

## 2024-04-05 DIAGNOSIS — Q2543 Congenital aneurysm of aorta: Secondary | ICD-10-CM | POA: Diagnosis not present

## 2024-04-05 DIAGNOSIS — E782 Mixed hyperlipidemia: Secondary | ICD-10-CM | POA: Diagnosis not present

## 2024-04-05 NOTE — Progress Notes (Signed)
 Daily Session Note  Patient Details  Name: Hannah Willis MRN: [id number] Date of Birth: 13-Dec-1950 Referring Provider:   Flowsheet Row Cardiac Rehab from 03/29/2024 in [facility] Cardiac and Pulmonary Rehab  Referring Provider Dr. Newman Lawrence    Encounter Date: 04/05/2024  Check In:  Session Check In - 04/05/24 1110       Check-In   Supervising physician immediately available to respond to emergencies See telemetry face sheet for immediately available ER MD    Location [facility]-Cardiac & Pulmonary Rehab    Staff Present Burnard Davenport RN,BSN,MPA;Maxon Burnell BS, Exercise Physiologist;Meredith Tressa RN,BSN;Noah Tickle, BS, Exercise Physiologist;Jason Elnor RDN,LDN    Virtual Visit No    Medication changes reported     No    Fall or balance concerns reported    No    Tobacco Cessation No Change    Warm-up and Cool-down Performed on first and last piece of equipment    Resistance Training Performed Yes    VAD Patient? No    PAD/SET Patient? No      Pain Assessment   Currently in Pain? No/denies             Social History   Tobacco Use  Smoking Status Former   Current packs/day: 0.00   Types: Cigarettes   Quit date: 06/20/1989   Years since quitting: 34.8  Smokeless Tobacco Never  Tobacco Comments   smoked occasional cigarettes for 5 years    Goals Met:  Independence with exercise equipment Exercise tolerated well No report of concerns or symptoms today Strength training completed today  Goals Unmet:  Not Applicable  Comments: First full day of exercise!  Patient was oriented to gym and equipment including functions, settings, policies, and procedures.  Patient's individual exercise prescription and treatment plan were reviewed.  All starting workloads were established based on the results of the 6 minute walk test done at initial orientation visit.  The plan for exercise progression was also introduced and progression will be customized based on patient's  performance and goals.    Dr. Oneil Pinal is Medical Director for [facility] Cardiac Rehabilitation.  Dr. Fuad Aleskerov is Medical Director for [facility] Pulmonary Rehabilitation.

## 2024-04-06 LAB — BASIC METABOLIC PANEL WITH GFR
BUN/Creatinine Ratio: 27 (ref 12–28)
BUN: 20 mg/dL (ref 8–27)
CO2: 24 mmol/L (ref 20–29)
Calcium: 9.4 mg/dL (ref 8.7–10.3)
Chloride: 99 mmol/L (ref 96–106)
Creatinine, Ser: 0.73 mg/dL (ref 0.57–1.00)
Glucose: 85 mg/dL (ref 70–99)
Potassium: 4 mmol/L (ref 3.5–5.2)
Sodium: 140 mmol/L (ref 134–144)
eGFR: 87 mL/min/1.73 (ref >59)

## 2024-04-07 ENCOUNTER — Encounter: Attending: Cardiology | Admitting: Emergency Medicine

## 2024-04-07 DIAGNOSIS — Z952 Presence of prosthetic heart valve: Secondary | ICD-10-CM | POA: Insufficient documentation

## 2024-04-07 NOTE — Progress Notes (Signed)
 Daily Session Note  Patient Details  Name: Hannah Willis MRN: [id number] Date of Birth: Dec 23, 1950 Referring Provider:   Flowsheet Row Cardiac Rehab from 03/29/2024 in [facility] Cardiac and Pulmonary Rehab  Referring Provider Dr. Newman Lawrence    Encounter Date: 04/07/2024  Check In:  Session Check In - 04/07/24 1123       Check-In   Supervising physician immediately available to respond to emergencies See telemetry face sheet for immediately available ER MD    Location [facility]-Cardiac & Pulmonary Rehab    Staff Present Rollene Paterson, MS, Exercise Physiologist;Verniece Encarnacion RN,BSN;Joseph Hood RCP,RRT,BSRT;Maxon Conetta BS, Exercise Physiologist    Virtual Visit No    Medication changes reported     No    Fall or balance concerns reported    No    Tobacco Cessation No Change    Warm-up and Cool-down Performed on first and last piece of equipment    Resistance Training Performed Yes    VAD Patient? No    PAD/SET Patient? No      Pain Assessment   Currently in Pain? No/denies             Social History   Tobacco Use  Smoking Status Former   Current packs/day: 0.00   Types: Cigarettes   Quit date: 06/20/1989   Years since quitting: 34.8  Smokeless Tobacco Never  Tobacco Comments   smoked occasional cigarettes for 5 years    Goals Met:  Independence with exercise equipment Exercise tolerated well No report of concerns or symptoms today Strength training completed today  Goals Unmet:  Not Applicable  Comments: Pt able to follow exercise prescription today without complaint.  Will continue to monitor for progression.    Dr. Oneil Pinal is Medical Director for [facility] Cardiac Rehabilitation.  Dr. Fuad Aleskerov is Medical Director for [facility] Pulmonary Rehabilitation.

## 2024-04-08 ENCOUNTER — Ambulatory Visit: Payer: Self-pay | Admitting: Physician Assistant

## 2024-04-12 ENCOUNTER — Encounter

## 2024-04-12 DIAGNOSIS — Z952 Presence of prosthetic heart valve: Secondary | ICD-10-CM | POA: Diagnosis not present

## 2024-04-12 NOTE — Progress Notes (Signed)
 Daily Session Note  Patient Details  Name: Hannah Willis MRN: [id number] Date of Birth: [birth year] Referring Provider:   Flowsheet Row Cardiac Rehab from 03/29/2024 in [facility] Cardiac and Pulmonary Rehab  Referring Provider Dr. Newman Lawrence    Encounter Date: 04/12/2024  Check In:  Session Check In - 04/12/24 1056       Check-In   Supervising physician immediately available to respond to emergencies See telemetry face sheet for immediately available ER MD    Location [facility]-Cardiac & Pulmonary Rehab    Staff Present Burnard Davenport RN,BSN,MPA;Maxon Conetta BS, Exercise Physiologist;Margaret Best, MS, Exercise Physiologist;Meredith Tressa RN,BSN;Jason Elnor RDN,LDN    Virtual Visit No    Medication changes reported     No    Fall or balance concerns reported    No    Tobacco Cessation No Change    Warm-up and Cool-down Performed on first and last piece of equipment    Resistance Training Performed Yes    VAD Patient? No    PAD/SET Patient? No      Pain Assessment   Currently in Pain? No/denies             Social History   Tobacco Use  Smoking Status Former   Current packs/day: 0.00   Types: Cigarettes   Quit date: 06/20/1989   Years since quitting: 34.8  Smokeless Tobacco Never  Tobacco Comments   smoked occasional cigarettes for 5 years    Goals Met:  Independence with exercise equipment Exercise tolerated well No report of concerns or symptoms today Strength training completed today  Goals Unmet:  Not Applicable  Comments: Pt able to follow exercise prescription today without complaint.  Will continue to monitor for progression.    Dr. Oneil Pinal is Medical Director for [facility] Cardiac Rehabilitation.  Dr. Fuad Aleskerov is Medical Director for [facility] Pulmonary Rehabilitation.

## 2024-04-14 ENCOUNTER — Encounter: Admitting: Emergency Medicine

## 2024-04-14 DIAGNOSIS — Z952 Presence of prosthetic heart valve: Secondary | ICD-10-CM

## 2024-04-14 NOTE — Progress Notes (Signed)
 Daily Session Note  Patient Details  Name: Hannah Willis MRN: [id number] Date of Birth: January 03, 1951 Referring Provider:   Flowsheet Row Cardiac Rehab from 03/29/2024 in [facility] Cardiac and Pulmonary Rehab  Referring Provider Dr. Newman Lawrence    Encounter Date: 04/14/2024  Check In:  Session Check In - 04/14/24 1105       Check-In   Supervising physician immediately available to respond to emergencies See telemetry face sheet for immediately available ER MD    Location [facility]-Cardiac & Pulmonary Rehab    Staff Present Leita Franks RN,BSN;Joseph [facility] BS, Exercise Physiologist;Jason Elnor RDN,LDN    Virtual Visit No    Medication changes reported     No    Fall or balance concerns reported    No    Tobacco Cessation No Change    Warm-up and Cool-down Performed on first and last piece of equipment    Resistance Training Performed Yes    VAD Patient? No    PAD/SET Patient? No      Pain Assessment   Currently in Pain? No/denies             Social History   Tobacco Use  Smoking Status Former   Current packs/day: 0.00   Types: Cigarettes   Quit date: 06/20/1989   Years since quitting: 34.8  Smokeless Tobacco Never  Tobacco Comments   smoked occasional cigarettes for 5 years    Goals Met:  Independence with exercise equipment Exercise tolerated well No report of concerns or symptoms today Strength training completed today  Goals Unmet:  Not Applicable  Comments: Pt able to follow exercise prescription today without complaint.  Will continue to monitor for progression.    Dr. Oneil Pinal is Medical Director for [facility] Cardiac Rehabilitation.  Dr. Fuad Aleskerov is Medical Director for [facility] Pulmonary Rehabilitation.

## 2024-04-18 ENCOUNTER — Encounter

## 2024-04-18 DIAGNOSIS — Z952 Presence of prosthetic heart valve: Secondary | ICD-10-CM | POA: Diagnosis not present

## 2024-04-18 NOTE — Progress Notes (Signed)
 Daily Session Note  Patient Details  Name: Hannah Willis MRN: [id number] Date of Birth: 03/16/1951 Referring Provider:   Flowsheet Row Cardiac Rehab from 03/29/2024 in [facility] Cardiac and Pulmonary Rehab  Referring Provider Dr. Newman Lawrence    Encounter Date: 04/18/2024  Check In:  Session Check In - 04/18/24 1054       Check-In   Supervising physician immediately available to respond to emergencies See telemetry face sheet for immediately available ER MD    Location [facility]-Cardiac & Pulmonary Rehab    Staff Present Burnard Davenport RN,BSN,MPA;Joseph Rolinda RCP,RRT,BSRT;Laura Cates RN,BSN;Iann Rodier Dyane BS, ACSM CEP, Exercise Physiologist    Virtual Visit No    Medication changes reported     No    Fall or balance concerns reported    No    Tobacco Cessation No Change    Warm-up and Cool-down Performed on first and last piece of equipment    Resistance Training Performed Yes    VAD Patient? No    PAD/SET Patient? No      Pain Assessment   Currently in Pain? No/denies             Social History   Tobacco Use  Smoking Status Former   Current packs/day: 0.00   Types: Cigarettes   Quit date: 06/20/1989   Years since quitting: 34.8  Smokeless Tobacco Never  Tobacco Comments   smoked occasional cigarettes for 5 years    Goals Met:  Independence with exercise equipment Exercise tolerated well No report of concerns or symptoms today Strength training completed today  Goals Unmet:  Not Applicable  Comments: Pt able to follow exercise prescription today without complaint.  Will continue to monitor for progression.    Dr. Oneil Pinal is Medical Director for [facility] Cardiac Rehabilitation.  Dr. Fuad Aleskerov is Medical Director for [facility] Pulmonary Rehabilitation.

## 2024-04-19 ENCOUNTER — Encounter

## 2024-04-20 ENCOUNTER — Encounter

## 2024-04-20 DIAGNOSIS — Z952 Presence of prosthetic heart valve: Secondary | ICD-10-CM

## 2024-04-20 NOTE — Progress Notes (Signed)
 Daily Session Note  Patient Details  Name: Hannah Willis MRN: [id number] Date of Birth: January 23, 1951 Referring Provider:   Flowsheet Row Cardiac Rehab from 03/29/2024 in [facility] Cardiac and Pulmonary Rehab  Referring Provider Dr. Newman Lawrence    Encounter Date: 04/20/2024  Check In:  Session Check In - 04/20/24 1052       Check-In   Supervising physician immediately available to respond to emergencies See telemetry face sheet for immediately available ER MD    Location [facility]-Cardiac & Pulmonary Rehab    Staff Present Burnard Davenport RN,BSN,MPA;Joseph Rolinda RCP,RRT,BSRT;Meredith Tressa RN,BSN;Breeanne Oblinger Dyane BS, ACSM CEP, Exercise Physiologist    Virtual Visit No    Medication changes reported     No    Fall or balance concerns reported    Yes    Comments ankle twist and bruised right knee yesterday, feels ok to exercise    Tobacco Cessation No Change    Warm-up and Cool-down Performed on first and last piece of equipment    Resistance Training Performed No   left at 11:32 due to ankle pain   VAD Patient? No    PAD/SET Patient? No      Pain Assessment   Currently in Pain? No/denies             Social History   Tobacco Use  Smoking Status Former   Current packs/day: 0.00   Types: Cigarettes   Quit date: 06/20/1989   Years since quitting: 34.8  Smokeless Tobacco Never  Tobacco Comments   smoked occasional cigarettes for 5 years    Goals Met:  Independence with exercise equipment Exercise tolerated well No report of concerns or symptoms today Strength training completed today  Goals Unmet:  Not Applicable  Comments: Pt able to follow exercise prescription today without complaint.  Will continue to monitor for progression.    Dr. Oneil Pinal is Medical Director for [facility] Cardiac Rehabilitation.  Dr. Fuad Aleskerov is Medical Director for [facility] Pulmonary Rehabilitation.

## 2024-04-21 ENCOUNTER — Encounter

## 2024-04-21 ENCOUNTER — Telehealth: Payer: Self-pay | Admitting: Cardiology

## 2024-04-21 ENCOUNTER — Encounter: Payer: Self-pay | Admitting: Cardiology

## 2024-04-21 ENCOUNTER — Ambulatory Visit: Attending: Cardiology | Admitting: Cardiology

## 2024-04-21 ENCOUNTER — Other Ambulatory Visit (HOSPITAL_COMMUNITY): Payer: Self-pay

## 2024-04-21 VITALS — BP 140/86 | HR 58 | Ht 62.0 in | Wt 131.0 lb

## 2024-04-21 DIAGNOSIS — I4819 Other persistent atrial fibrillation: Secondary | ICD-10-CM | POA: Diagnosis not present

## 2024-04-21 DIAGNOSIS — E782 Mixed hyperlipidemia: Secondary | ICD-10-CM

## 2024-04-21 DIAGNOSIS — I340 Nonrheumatic mitral (valve) insufficiency: Secondary | ICD-10-CM | POA: Diagnosis not present

## 2024-04-21 DIAGNOSIS — Q2543 Congenital aneurysm of aorta: Secondary | ICD-10-CM | POA: Diagnosis not present

## 2024-04-21 DIAGNOSIS — Z952 Presence of prosthetic heart valve: Secondary | ICD-10-CM | POA: Diagnosis not present

## 2024-04-21 DIAGNOSIS — I10 Essential (primary) hypertension: Secondary | ICD-10-CM

## 2024-04-21 DIAGNOSIS — I7121 Aneurysm of the ascending aorta, without rupture: Secondary | ICD-10-CM

## 2024-04-21 DIAGNOSIS — I5020 Unspecified systolic (congestive) heart failure: Secondary | ICD-10-CM | POA: Diagnosis not present

## 2024-04-21 MED ORDER — LOSARTAN POTASSIUM 50 MG PO TABS
50.0000 mg | ORAL_TABLET | Freq: Every day | ORAL | 3 refills | Status: DC
  Filled 2024-04-21 (×2): qty 90, 90d supply, fill #0

## 2024-04-21 MED ORDER — METOPROLOL SUCCINATE ER 100 MG PO TB24
100.0000 mg | ORAL_TABLET | Freq: Every day | ORAL | 3 refills | Status: DC
  Filled 2024-04-21: qty 90, 90d supply, fill #0

## 2024-04-21 MED ORDER — LOSARTAN POTASSIUM 50 MG PO TABS: 50.0000 mg | ORAL_TABLET | Freq: Every day | ORAL | 3 refills | Status: AC

## 2024-04-21 NOTE — Addendum Note (Signed)
 Addended by: Shyne Lehrke on: 04/21/2024 04:56 PM   Modules accepted: Orders

## 2024-04-21 NOTE — Telephone Encounter (Signed)
 RX sent in

## 2024-04-21 NOTE — Patient Instructions (Addendum)
 Medication Instructions:  Your physician has recommended you make the following change in your medication:  1.) stop amiodarone  2.) decrease metoprolol  to 1 tablet (100 mg) daily 3.) start losartan 50 mg - 1 tablet daily  *If you need a refill on your cardiac medications before your next appointment, please call your pharmacy*  Lab Work: Return in about 1 week for blood work (bmet, pro bnp, lipid panel)  If you have labs (blood work) drawn today and your tests are completely normal, you will receive your results only by: MyChart Message (if you have MyChart) OR A paper copy in the mail If you have any lab test that is abnormal or we need to change your treatment, we will call you to review the results.  Testing/Procedures: Echo as planned in December  Follow-Up: At [facility], you and your health needs are our priority.  As part of our continuing mission to provide you with exceptional heart care, our providers are all part of one team.  This team includes your primary Cardiologist (physician) and Advanced Practice Providers or APPs (Physician Assistants and Nurse Practitioners) who all work together to provide you with the care you need, when you need it.  Your next appointment:   3 month(s)  Provider:   Newman JINNY Lawrence, MD

## 2024-04-21 NOTE — Telephone Encounter (Signed)
*  STAT* If patient is at the pharmacy, call can be transferred to refill team.   1. Which medications need to be refilled? (please list name of each medication and dose if known) losartan (COZAAR) 50 MG tablet    2. Would you like to learn more about the convenience, safety, & potential cost savings by using the [facility]?      3. Are you open to using the [facility] (Type [facility].  ).   4. Which pharmacy/location (including street and city if local pharmacy) is medication to be sent to?  [id number] - [place], [place] - [address] [facility]     5. Do they need a 30 day or 90 day supply? 90 day

## 2024-04-21 NOTE — Progress Notes (Signed)
 Cardiology Office Note:  .   Date:  04/21/2024  ID:  Hannah Willis, DOB 1951-06-06, MRN [id number] PCP: Claudene Pellet, MD  [facility] Providers Cardiologist:  Newman Lawrence, MD PCP: Claudene Pellet, MD  Chief Complaint  Patient presents with   Atrial Fibrillation     Hannah Willis is a 73 y.o. 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 Reimplantation of left and right coronary arteries (02/2024), persistent A-fib  History of Present Illness Patient is doing well.  Dizziness that was persistent after the surgery has significantly improved.  She denies any chest pain or shortness of breath.  She is feeling well overall.  She had a mechanical fall few days ago with minor injuries.   Vitals:   04/21/24 1145  BP: (!) 140/86  Pulse: (!) 58  SpO2: 95%        Review of Systems  Cardiovascular:  Positive for chest pain and palpitations. Negative for dyspnea on exertion, leg swelling and syncope.        Studies Reviewed: SABRA        EKG 04/21/2024: SInus rhythm 58 bpm with frequent Premature ventricular complexes T wave inversion in lateral leads, consider ischemia When compared with ECG of 30-Mar-2024 15:46, Premature ventricular complexes are now Present    Echocardiogram 03/2024: 1. Left ventricular ejection fraction, by estimation, is 40 to 45%. Left  ventricular ejection fraction by 2D MOD biplane is 40.3 %. The left  ventricle has mildly decreased function. The left ventricle demonstrates  global hypokinesis. There is moderate  concentric left ventricular hypertrophy. Left ventricular diastolic  parameters are consistent with Grade I diastolic dysfunction (impaired  relaxation).   2. Right ventricular systolic function  is mildly reduced. The right  ventricular size is normal.   3. Left atrial size was moderately dilated.   4. 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 There is moderate late systolic prolapse of the middle  segment of the anterior leaflet of the mitral valve.   5. The aortic valve has been repaired/replaced. Aortic valve  regurgitation is not visualized. No aortic stenosis is present. There is a  25 mm Edwards Konect Resilia valve present in the aortic position.  Procedure Date: 02/08/2024. Echo findings are  consistent with normal structure and function of the aortic valve  prosthesis. Aortic valve mean gradient measures 5.0 mmHg.   6. Valved aortic conduit repair. Aortic root/ascending aorta has been  repaired/replaced.   Comparison(s): 02/08/2024: LVEF 45-50%, severe AI. Compared to the prior  study, the LVEF has mildly declined, however, the patient is now s/p AVR  and aortic root replacement.   Intraoperative TEE 02/2020: POST-OP IMPRESSIONS  _ Left Ventricle: has low normal systolic function, estimated at 45-50%.  _ Right Ventricle: normal function.  _ Aorta: A graft was placed in the ascending aorta for repair.  _ Left Atrial Appendage: The left atrial appendage appears unchanged from pre-bypass.  _ Aortic Valve: Status post bioprosthetic aortic valve replacement, with  Hannah Willis function of the prosthesis. No paravalvular or intravalvular leak  visualized. Trans-aortic valvular mean gradient .  _ Mitral Valve: The mitral valve appears unchanged from pre-bypass. There is moderate regurgitation.  _ Tricuspid Valve: The tricuspid valve appears unchanged from pre-bypass. There is mild regurgitation.  _ Pulmonic Valve: The pulmonic valve appears  unchanged from pre-bypass.  _ Interatrial Septum: The interatrial septum appears unchanged from  pre-bypass.  _ Pericardium: The pericardium appears unchanged from pre-bypass.  _ Comments: No significant  pleural or pericardial effusion.   CTA aorta 01/2024: 1. 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 2. No effacement of the sino-tubular junction and mildly ectatic but nonaneurysmal ascending aorta with normal transverse and descending thoracic aorta diameters. 3. Aortic and coronary artery atherosclerotic vascular calcifications. 4. Four vessel arch anatomy. The left vertebral artery arises directly from the aorta. 5. Cardiomegaly with prominent left atrial enlargement and mild left ventricular dilatation. 6. Nonobstructing left lower pole nephrolithiasis. 7. Multilevel degenerative disc disease.    Coronary angiography 12/25/2023: LM: Normal LAD: Prox 30% , distal 40% disease Ramus: Small, no significant disease Lcx: No significant disease RCA: Large, dominant          RPLA focal 40% disease   LVEDP 30 mmHg   Right heart catheterization 12/25/2023: RA: 7 mmHg RV: 27/3 mmHg PA: 30/15 mmHg, mPAP 16 mmHg PCW: 12 mmHg   AO sats: 97% PA sats: 69%   CO: 3.9 L/min CI: 2.3 L/min/m2   Mild nonobstructive coronary artery disease Elevated LVEDP in the setting of severe aortic regurgitation and reduced LVEF Refer to cardiothoracic surgery for consideration for aortic valve and possible aortic root replacement    TEE 12/2023: 1. AI is central with eccentric course; likely due to dilated aortic  root.   2. Left ventricular ejection fraction, by estimation, is 45 to 50%. The  left ventricle has mildly decreased function. The left ventricle  demonstrates global hypokinesis. The left ventricular internal cavity size  was moderately dilated.   3. Right ventricular systolic function is normal. The right ventricular  size is normal.   4. Left atrial size was severely dilated. No left atrial/left atrial  appendage thrombus was detected.   5. 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 There is moderate  holosystolic prolapse of the middle  segment of the anterior leaflet of the mitral valve.   6. The aortic valve is tricuspid. Aortic valve regurgitation is severe.  Aortic valve sclerosis is present, with no evidence of aortic valve  stenosis.   7. Aortic dilatation noted. There is moderate dilatation of the aortic  root, measuring 47 mm. There is Moderate (Grade III) plaque involving the  descending aorta.   8. 3D performed of the mitral valve and 3D performed of the aortic valve.   Echocardiogram 12/07/2023:  1. Left ventricular ejection fraction, by estimation, is 45 to 50%. The  left ventricle has mildly decreased function. The left ventricle  demonstrates global hypokinesis. The left ventricular internal cavity size  was mildly dilated. There is mild eccentric left ventricular hypertrophy.  Left ventricular diastolic parameters are consistent with Grade II diastolic dysfunction  (pseudonormalization). Elevated left atrial pressure.   2. Right ventricular systolic function is normal. The right ventricular  size is normal. There is normal pulmonary artery systolic pressure. The  estimated right ventricular systolic pressure is 35.3 mmHg.   3. Left atrial size was severely dilated.   4. 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 No evidence of mitral stenosis. There is severe  holosystolic prolapse of the middle segment of the anterior leaflet of the  mitral valve.   5. The aortic valve is tricuspid. Aortic valve regurgitation is moderate  to severe. No aortic  stenosis is present. Aortic regurgitation PHT  measures 276 msec.   6. Aortic dilatation noted. There is moderate dilatation of the aortic  root, measuring 45 mm. There is mild dilatation of the ascending aorta,  measuring 41 mm.   7. The inferior vena cava is normal in size with greater than 50%  respiratory variability, suggesting right atrial pressure of 3 mmHg.   Comparison(s): Prior  images unable to be directly viewed, comparison made  by report only. Aortic insufficiency is a new dominant valvular  abnormality and the left ventricle has dilated and has reduced systolic  fumnction. 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   Labs 03/2024: Cr 0.73  Labs 02/2024: Hb 12.0. Cr 1.01, Na 133  11/2023: Chol 126, TG 49, HDL 65, LDL 50 HbA1C 5% Hb 13.5, WBC 19k Cr 0.55, T.bili 1.8 TSH 0.8   Physical Exam Vitals and nursing note reviewed.  Constitutional:      General: She is not in acute distress. Neck:     Vascular: No JVD.  Cardiovascular:     Rate and Rhythm: Normal rate. Rhythm irregular.     Heart sounds: No murmur heard. Pulmonary:     Effort: Pulmonary effort is normal.     Breath sounds: Normal breath sounds. No wheezing or rales.  Musculoskeletal:     Right lower leg: No edema.     Left lower leg: No edema.      VISIT DIAGNOSES:   ICD-10-CM   1. HYPERTENSION  I10 EKG 12-Lead    Basic metabolic panel with GFR    Basic metabolic panel with GFR    2. Nonrheumatic mitral valve regurgitation  I34.0 EKG 12-Lead    3. Status post aortic valve replacement  Z95.2 EKG 12-Lead    4. Persistent atrial fibrillation (HCC)  I48.19 EKG 12-Lead    5. Aortic root aneurysm  Q25.43     6. HFrEF (heart failure with reduced ejection fraction) (HCC)  I50.20 Pro b natriuretic peptide (BNP)    Basic metabolic panel with GFR    Basic metabolic panel with GFR    Pro b natriuretic peptide (BNP)    7. Aneurysm of ascending aorta without rupture  I71.21     8. Mixed hyperlipidemia  E78.2           Hannah Willis is a 73 y.o. 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 Reimplantation of left and right coronary  arteries (02/2024), persistent A-fib Assessment & Plan Persistent A-fib: Post op Afib, now resolved. Maintaining sinus rhythm. Okay to discontinue amiodarone . Continue metoprolol  succinate, reduced from 150 mg daily to 100 mg daily. Continue Eliquis  5 mg twice daily. Emphasize importance of avoiding injuries.  Aortic regurgitation, aortic root aneurysm: Now s/p Biological Bentall Procedure using a 25 mm Edwards KONECT RESILIA pericardial valved conduit. Reimplantation of left and right coronary arteries (02/2024). EF remained mildly reduced on echocardiogram in 03/2024. Continue Jardiance  10 mg daily. Added losartan 50 mg daily. Check BMP and proBNP in 1 week. Check echocardiogram in 06/2024.  Mixed hyperlipidemia: Continue Crestor  5 mg daily.  Check lipid panel in 1 week.  F/u in 3 months  Signed, Newman JINNY Lawrence, MD

## 2024-04-22 ENCOUNTER — Other Ambulatory Visit

## 2024-04-22 DIAGNOSIS — K51319 Ulcerative (chronic) rectosigmoiditis with unspecified complications: Secondary | ICD-10-CM | POA: Diagnosis not present

## 2024-04-25 LAB — CALPROTECTIN, FECAL: Calprotectin, Fecal: 284 ug/g — ABNORMAL HIGH (ref 0–120)

## 2024-04-26 ENCOUNTER — Ambulatory Visit: Payer: Self-pay | Admitting: Gastroenterology

## 2024-04-26 ENCOUNTER — Encounter

## 2024-04-26 DIAGNOSIS — Z952 Presence of prosthetic heart valve: Secondary | ICD-10-CM

## 2024-04-26 NOTE — Progress Notes (Signed)
 Daily Session Note  Patient Details  Name: Hannah Willis MRN: [id number] Date of Birth: [birth year] Referring Provider:   Flowsheet Row Cardiac Rehab from 03/29/2024 in [facility]  Referring Provider Dr. Newman Lawrence    Encounter Date: 04/26/2024  Check In:  Session Check In - 04/26/24 1047       Check-In   Supervising physician immediately available to respond to emergencies See telemetry face sheet for immediately available ER MD    Location [facility]-Cardiac & Pulmonary Rehab    Staff Present Burnard Davenport RN,BSN,MPA;Karna Serve PhD, RN,CNS,CEN;Maxon [facility], Exercise Physiologist;Jason Elnor [facility]    Virtual Visit No    Medication changes reported     No    Fall or balance concerns reported    No    Tobacco Cessation No Change    Warm-up and Cool-down Performed on first and last piece of equipment    Resistance Training Performed Yes    VAD Patient? No    PAD/SET Patient? No      Pain Assessment   Currently in Pain? No/denies             Social History   Tobacco Use  Smoking Status Former   Current packs/day: 0.00   Types: Cigarettes   Quit date: 06/20/1989   Years since quitting: 34.8  Smokeless Tobacco Never  Tobacco Comments   smoked occasional cigarettes for 5 years    Goals Met:  Independence with exercise equipment Exercise tolerated well No report of concerns or symptoms today Strength training completed today  Goals Unmet:  Not Applicable  Comments: Pt able to follow exercise prescription today without complaint.  Will continue to monitor for progression.    Dr. Oneil Pinal is Medical Director for [facility] Cardiac Rehabilitation.  Dr. Fuad Aleskerov is Medical Director for [facility] Pulmonary Rehabilitation.

## 2024-04-27 ENCOUNTER — Encounter: Payer: Self-pay | Admitting: *Deleted

## 2024-04-27 DIAGNOSIS — K5190 Ulcerative colitis, unspecified, without complications: Secondary | ICD-10-CM | POA: Diagnosis not present

## 2024-04-27 DIAGNOSIS — E7800 Pure hypercholesterolemia, unspecified: Secondary | ICD-10-CM | POA: Diagnosis not present

## 2024-04-27 DIAGNOSIS — G4733 Obstructive sleep apnea (adult) (pediatric): Secondary | ICD-10-CM | POA: Diagnosis not present

## 2024-04-27 DIAGNOSIS — Z9081 Acquired absence of spleen: Secondary | ICD-10-CM | POA: Diagnosis not present

## 2024-04-27 DIAGNOSIS — Z952 Presence of prosthetic heart valve: Secondary | ICD-10-CM

## 2024-04-27 DIAGNOSIS — I10 Essential (primary) hypertension: Secondary | ICD-10-CM | POA: Diagnosis not present

## 2024-04-27 DIAGNOSIS — Z23 Encounter for immunization: Secondary | ICD-10-CM | POA: Diagnosis not present

## 2024-04-27 DIAGNOSIS — I679 Cerebrovascular disease, unspecified: Secondary | ICD-10-CM | POA: Diagnosis not present

## 2024-04-27 DIAGNOSIS — M810 Age-related osteoporosis without current pathological fracture: Secondary | ICD-10-CM | POA: Diagnosis not present

## 2024-04-27 DIAGNOSIS — I5020 Unspecified systolic (congestive) heart failure: Secondary | ICD-10-CM | POA: Diagnosis not present

## 2024-04-27 NOTE — Progress Notes (Signed)
 Cardiac Individual Treatment Plan  Patient Details  Name: Hannah Willis MRN: [id number] Date of Birth: 1950-12-24 Referring Provider:   Flowsheet Row Cardiac Rehab from 03/29/2024 in [facility] Cardiac and Pulmonary Rehab  Referring Provider Dr. Newman Lawrence    Initial Encounter Date:  Flowsheet Row Cardiac Rehab from 03/29/2024 in [facility] Cardiac and Pulmonary Rehab  Date 03/29/24    Visit Diagnosis: S/P aortic valve replacement  Patient's Home Medications on Admission:  Current Outpatient Medications:    acetaminophen  (TYLENOL ) 500 MG tablet, Take 1-2 tablets (500-1,000 mg total) by mouth every 6 (six) hours as needed., Disp: , Rfl:    alendronate (FOSAMAX) 70 MG tablet, Take 70 mg by mouth once a week. Take with a full glass of water  on an empty stomach., Disp: , Rfl:    apixaban  (ELIQUIS ) 5 MG TABS tablet, Take 1 tablet (5 mg total) by mouth 2 (two) times daily., Disp: 180 tablet, Rfl: 1   Apoaequorin (PREVAGEN PO), Take 1 tablet by mouth daily., Disp: , Rfl:    calcium  carbonate (OSCAL) 1500 (600 Ca) MG TABS tablet, Take by mouth 2 (two) times daily with a meal., Disp: , Rfl:    Calcium  Carbonate-Vit D-Min (CALCIUM  1200 PO), Take 1 tablet by mouth daily at 6 (six) AM., Disp: , Rfl:    empagliflozin  (JARDIANCE ) 10 MG TABS tablet, Take 1 tablet (10 mg total) by mouth daily before breakfast., Disp: 90 tablet, Rfl: 3   ferrous sulfate  325 (65 FE) MG tablet, Take 325 mg by mouth daily., Disp: , Rfl:    losartan (COZAAR) 50 MG tablet, Take 1 tablet (50 mg total) by mouth daily., Disp: 90 tablet, Rfl: 3   metoprolol  succinate (TOPROL -XL) 100 MG 24 hr tablet, Take 1 tablet (100 mg total) by mouth daily. Take with or immediately following a meal., Disp: 90 tablet, Rfl: 3   ondansetron  (ZOFRAN ) 4 MG tablet, Take 1 tablet (4 mg total) by mouth every 8 (eight) hours as needed for nausea or vomiting., Disp: 90 tablet, Rfl: 1   rosuvastatin  (CRESTOR ) 20 MG tablet, Take 20 mg by mouth daily.,  Disp: , Rfl:    thiamine  (VITAMIN B-1) 100 MG tablet, Take 100 mg by mouth daily., Disp: , Rfl:    traMADol  (ULTRAM ) 50 MG tablet, Take 1 tablet (50 mg total) by mouth every 6 (six) hours as needed for severe pain (pain score 7-10)., Disp: 28 tablet, Rfl: 0   Vedolizumab  (ENTYVIO  IV), Inject 1 Dose as directed every 8 (eight) weeks. Experimental every two months, given at the MD office, Disp: , Rfl:    VITAMIN D  PO, Take 1 tablet by mouth daily., Disp: , Rfl:   Past Medical History: Past Medical History:  Diagnosis Date   Allergy    Apnea, sleep    no c-pap machine   Arthritis    OA   Basal cell carcinoma    on front chest   Blood in stool    LAST 2 MONTHS, MEDS CHNAGED FOR   GERD (gastroesophageal reflux disease)    Heart murmur    HFrEF (heart failure with reduced ejection fraction) (HCC) 01/20/2024   Hyperlipidemia    Hypertension    Kidney stones    Laryngitis    started monday night   Neuromuscular disorder (HCC)    Post-operative nausea and vomiting    Severe aortic insufficiency    Severe aortic regurgitation 12/21/2023   Spherocytosis (familial)    Stroke (HCC) 09/21/2007   Synovitis of right  hand 12/29/2015   and wrist   Thoracic aortic aneurysm    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   Ulcerative colitis (HCC)    Weakness of right hand    due to stroke    Tobacco Use: Social History   Tobacco Use  Smoking Status Former   Current packs/day: 0.00   Types: Cigarettes   Quit date: 06/20/1989   Years since quitting: 34.8  Smokeless Tobacco Never  Tobacco Comments   smoked occasional cigarettes for 5 years    Labs: Review Flowsheet  More data may exist      Latest Ref Rng & Units 09/21/2007 04/04/2015 12/25/2023 02/05/2024 02/08/2024  Labs for ITP Cardiac and Pulmonary Rehab  Cholestrol 100 - 199 mg/dL 821        ATP III CLASSIFICATION:  <200     mg/dL   Desirable  799-760  mg/dL   Borderline High  >=759    mg/dL   High  798  - - -  LDL (calc) 0 - 99  mg/dL 885        Total Cholesterol/HDL:CHD Risk Coronary Heart Disease Risk Table                     Men   Women  1/2 Average Risk   3.4   3.3  114  - - -  HDL-C >39 mg/dL 54  69  - - -  Trlycerides 0 - 149 mg/dL 51  89  - - -  Hemoglobin A1c 4.8 - 5.6 % - 5.4  - 5.4  -  PH, Arterial 7.35 - 7.45 - - 7.432  - 7.375  7.364  7.381  7.433  7.467  7.484   PCO2 arterial 32 - 48 mmHg - - 33.0  - 36.8  40.3  35.7  33.3  32.2  28.5   Bicarbonate 20.0 - 28.0 mmol/L - - 23.7  23.9  22.0  - 21.4  22.8  21.6  22.3  23.3  23.3  21.4   TCO2 22 - 32 mmol/L - - 25  25  23   - 23  24  23  23  26  27  24  26  26  27  24  22  22  22    Acid-base deficit 0.0 - 2.0 mmol/L - - 1.0  1.0  2.0  - 3.0  2.0  4.0  2.0  1.0  2.0   O2 Saturation % - - 66  72  97  - 97  97  97  100  100  72  100     Details       Multiple values from one day are sorted in reverse-chronological order          Exercise Target Goals: Exercise Program Goal: Individual exercise prescription set using results from initial 6 min walk test and THRR while considering  patient's activity barriers and safety.   Exercise Prescription Goal: Initial exercise prescription builds to 30-45 minutes a day of aerobic activity, 2-3 days per week.  Home exercise guidelines will be given to patient during program as part of exercise prescription that the participant will acknowledge.   Education: Aerobic Exercise: - Group verbal and visual presentation on the components of exercise prescription. Introduces F.I.T.T principle from ACSM for exercise prescriptions.  Reviews F.I.T.T. principles of aerobic exercise including progression. Written material provided at class time.   Education: Resistance Exercise: - Group verbal and visual presentation on the components of  exercise prescription. Introduces F.I.T.T principle from ACSM for exercise prescriptions  Reviews F.I.T.T. principles of resistance exercise including progression. Written material provided  at class time.    Education: Exercise & Equipment Safety: - Individual verbal instruction and demonstration of equipment use and safety with use of the equipment. Flowsheet Row Cardiac Rehab from 03/29/2024 in [facility] Cardiac and Pulmonary Rehab  Date 03/29/24  Educator MJC  Instruction Review Code 1- Verbalizes Understanding    Education: Exercise Physiology & General Exercise Guidelines: - Group verbal and written instruction with models to review the exercise physiology of the cardiovascular system and associated critical values. Provides general exercise guidelines with specific guidelines to those with heart or lung disease. Written material provided at class time.   Education: Flexibility, Balance, Mind/Body Relaxation: - Group verbal and visual presentation with interactive activity on the components of exercise prescription. Introduces F.I.T.T principle from ACSM for exercise prescriptions. Reviews F.I.T.T. principles of flexibility and balance exercise training including progression. Also discusses the mind body connection.  Reviews various relaxation techniques to help reduce and manage stress (i.e. Deep breathing, progressive muscle relaxation, and visualization). Balance handout provided to take home. Written material provided at class time.   Activity Barriers & Risk Stratification:  Activity Barriers & Cardiac Risk Stratification - 03/29/24 1506       Activity Barriers & Cardiac Risk Stratification   Activity Barriers Left Knee Replacement;Right Knee Replacement;Joint Problems    Cardiac Risk Stratification Moderate          6 Minute Walk:  6 Minute Walk     Row Name 03/29/24 1504         6 Minute Walk   Phase Initial     Distance 1135 feet     Walk Time 6 minutes     # of Rest Breaks 0     MPH 2.14     METS 2.46     RPE 11     Perceived Dyspnea  1     VO2 Peak 8.6     Symptoms No     Resting HR 60 bpm     Resting BP 126/68     Resting Oxygen Saturation   90 %     Exercise Oxygen Saturation  during 6 min walk 90 %     Max Ex. HR 83 bpm     Max Ex. BP 148/64     2 Minute Post BP 124/62        Oxygen Initial Assessment:   Oxygen Re-Evaluation:   Oxygen Discharge (Final Oxygen Re-Evaluation):   Initial Exercise Prescription:  Initial Exercise Prescription - 03/29/24 1500       Date of Initial Exercise RX and Referring Provider   Date 03/29/24    Referring Provider Dr. Newman Lawrence      Oxygen   Maintain Oxygen Saturation 88% or higher      Treadmill   MPH 2.2    Grade 0    Minutes 15    METs 2.68      Recumbant Bike   Level 2    RPM 50    Watts 25    Minutes 15    METs 2.46      NuStep   Level 2    SPM 80    Minutes 15    METs 2.46      T5 Nustep   Level 2    SPM 80    Minutes 15    METs 2.46  Biostep-RELP   Level 2    SPM 50    Minutes 15    METs 2.46      Track   Laps 30    Minutes 15    METs 2.63      Prescription Details   Duration Progress to 30 minutes of continuous aerobic without signs/symptoms of physical distress      Intensity   THRR 40-80% of Max Heartrate 94129    Ratings of Perceived Exertion 11-13    Perceived Dyspnea 0-4      Progression   Progression Continue to progress workloads to maintain intensity without signs/symptoms of physical distress.      Resistance Training   Training Prescription Yes    Weight 2lb    Reps 10-15          Perform Capillary Blood Glucose checks as needed.  Exercise Prescription Changes:   Exercise Prescription Changes     Row Name 03/29/24 1500 04/13/24 1100           Response to Exercise   Blood Pressure (Admit) 126/68 104/62      Blood Pressure (Exercise) 148/64 130/62      Blood Pressure (Exit) 124/62 106/58      Heart Rate (Admit) 60 bpm 58 bpm      Heart Rate (Exercise) 83 bpm 85 bpm      Heart Rate (Exit) 59 bpm 61 bpm      Oxygen Saturation (Admit) 90 % --      Oxygen Saturation (Exercise) 90 % --       Oxygen Saturation (Exit) 90 % --      Rating of Perceived Exertion (Exercise) 11 13      Perceived Dyspnea (Exercise) 1 --      Symptoms none none      Comments results first 2 weeks of exercise      Duration -- Progress to 30 minutes of  aerobic without signs/symptoms of physical distress      Intensity -- THRR unchanged        Progression   Progression -- Continue to progress workloads to maintain intensity without signs/symptoms of physical distress.      Average METs -- 2.79        Resistance Training   Training Prescription -- Yes      Weight -- 2lb      Reps -- 10-15        Interval Training   Interval Training -- No        Treadmill   MPH -- 2.3      Grade -- 0      Minutes -- 15      METs -- 2.3        NuStep   Level -- 3      Minutes -- 15      METs -- 2.9        Oxygen   Maintain Oxygen Saturation -- 88% or higher         Exercise Comments:   Exercise Comments     Row Name 04/05/24 1111           Exercise Comments First full day of exercise!  Patient was oriented to gym and equipment including functions, settings, policies, and procedures.  Patient's individual exercise prescription and treatment plan were reviewed.  All starting workloads were established based on the results of the 6 minute walk test done at initial orientation visit.  The plan for exercise  progression was also introduced and progression will be customized based on patient's performance and goals.          Exercise Goals and Review:   Exercise Goals     Row Name 03/29/24 1510             Exercise Goals   Increase Physical Activity Yes       Intervention Provide advice, education, support and counseling about physical activity/exercise needs.;Develop an individualized exercise prescription for aerobic and resistive training based on initial evaluation findings, risk stratification, comorbidities and participant's personal goals.       Expected Outcomes Long Term:  Exercising regularly at least 3-5 days a week.;Long Term: Add in home exercise to make exercise part of routine and to increase amount of physical activity.;Short Term: Attend rehab on a regular basis to increase amount of physical activity.       Increase Strength and Stamina Yes       Intervention Provide advice, education, support and counseling about physical activity/exercise needs.;Develop an individualized exercise prescription for aerobic and resistive training based on initial evaluation findings, risk stratification, comorbidities and participant's personal goals.       Expected Outcomes Short Term: Increase workloads from initial exercise prescription for resistance, speed, and METs.;Short Term: Perform resistance training exercises routinely during rehab and add in resistance training at home;Long Term: Improve cardiorespiratory fitness, muscular endurance and strength as measured by increased METs and functional capacity ( )       Able to understand and use rate of perceived exertion (RPE) scale Yes       Intervention Provide education and explanation on how to use RPE scale       Expected Outcomes Long Term:  Able to use RPE to guide intensity level when exercising independently;Short Term: Able to use RPE daily in rehab to express subjective intensity level       Able to understand and use Dyspnea scale Yes       Intervention Provide education and explanation on how to use Dyspnea scale       Expected Outcomes Short Term: Able to use Dyspnea scale daily in rehab to express subjective sense of shortness of breath during exertion;Long Term: Able to use Dyspnea scale to guide intensity level when exercising independently       Knowledge and understanding of Target Heart Rate Range (THRR) Yes       Intervention Provide education and explanation of THRR including how the numbers were predicted and where they are located for reference       Expected Outcomes Short Term: Able to state/look up  THRR;Short Term: Able to use daily as guideline for intensity in rehab;Long Term: Able to use THRR to govern intensity when exercising independently       Able to check pulse independently Yes       Intervention Provide education and demonstration on how to check pulse in carotid and radial arteries.;Review the importance of being able to check your own pulse for safety during independent exercise       Expected Outcomes Short Term: Able to explain why pulse checking is important during independent exercise;Long Term: Able to check pulse independently and accurately       Understanding of Exercise Prescription Yes       Intervention Provide education, explanation, and written materials on patient's individual exercise prescription       Expected Outcomes Short Term: Able to explain program exercise prescription;Long Term: Able to explain home exercise  prescription to exercise independently          Exercise Goals Re-Evaluation :  Exercise Goals Re-Evaluation     Row Name 04/05/24 1111 04/13/24 1143           Exercise Goal Re-Evaluation   Exercise Goals Review Increase Physical Activity;Able to understand and use rate of perceived exertion (RPE) scale;Knowledge and understanding of Target Heart Rate Range (THRR);Understanding of Exercise Prescription;Increase Strength and Stamina;Able to understand and use Dyspnea scale;Able to check pulse independently Increase Physical Activity;Increase Strength and Stamina;Understanding of Exercise Prescription      Comments Reviewed RPE and dyspnea scale, THR and program prescription with pt today.  Pt voiced understanding and was given a copy of goals to take home. Sojourner is off to a good start in the program, and was able to attend her first two sessions during this review period. During these sessions she was able to increase her treadmill workload to 2. and no incline, and increase to level 3 on the T4 nustep. We will continue to monitor her progress  in the program.      Expected Outcomes Short: Use RPE daily to regulate intensity. Long: Follow program prescription in THR. Short: Continue to follow exercise prescription. Long: Continue exercise to improve strength and stamina.         Discharge Exercise Prescription (Final Exercise Prescription Changes):  Exercise Prescription Changes - 04/13/24 1100       Response to Exercise   Blood Pressure (Admit) 104/62    Blood Pressure (Exercise) 130/62    Blood Pressure (Exit) 106/58    Heart Rate (Admit) 58 bpm    Heart Rate (Exercise) 85 bpm    Heart Rate (Exit) 61 bpm    Rating of Perceived Exertion (Exercise) 13    Symptoms none    Comments first 2 weeks of exercise    Duration Progress to 30 minutes of  aerobic without signs/symptoms of physical distress    Intensity THRR unchanged      Progression   Progression Continue to progress workloads to maintain intensity without signs/symptoms of physical distress.    Average METs 2.79      Resistance Training   Training Prescription Yes    Weight 2lb    Reps 10-15      Interval Training   Interval Training No      Treadmill   MPH 2.3    Grade 0    Minutes 15    METs 2.3      NuStep   Level 3    Minutes 15    METs 2.9      Oxygen   Maintain Oxygen Saturation 88% or higher          Nutrition:  Target Goals: Understanding of nutrition guidelines, daily intake of sodium 1500mg , cholesterol 200mg , calories 30% from fat and 7% or less from saturated fats, daily to have 5 or more servings of fruits and vegetables.  Education: Nutrition 1 -Group instruction provided by verbal, written material, interactive activities, discussions, models, and posters to present general guidelines for heart healthy nutrition including macronutrients, label reading, and promoting whole foods over processed counterparts. Education serves as Pensions consultant of discussion of heart healthy eating for all. Written material provided at class  time.    Education: Nutrition 2 -Group instruction provided by verbal, written material, interactive activities, discussions, models, and posters to present general guidelines for heart healthy nutrition including sodium, cholesterol, and saturated fat. Providing guidance of habit forming to  improve blood pressure, cholesterol, and body weight. Written material provided at class time.     Biometrics:  Pre Biometrics - 03/29/24 1510       Pre Biometrics   Height 5' 2.6 (1.59 m)    Weight 135 lb 11.2 oz (61.6 kg)    Waist Circumference 32 inches    Hip Circumference 35.5 inches    Waist to Hip Ratio 0.9 %    BMI (Calculated) 24.35    Single Leg Stand 7 seconds           Nutrition Therapy Plan and Nutrition Goals:   Nutrition Assessments:  MEDIFICTS Score Key: >=70 Need to make dietary changes  40-70 Heart Healthy Diet <= 40 Therapeutic Level Cholesterol Diet   Picture Your Plate Scores: <59 Unhealthy dietary pattern with much room for improvement. 41-50 Dietary pattern unlikely to meet recommendations for good health and room for improvement. 51-60 More healthful dietary pattern, with some room for improvement.  >60 Healthy dietary pattern, although there may be some specific behaviors that could be improved.    Nutrition Goals Re-Evaluation:   Nutrition Goals Discharge (Final Nutrition Goals Re-Evaluation):   Psychosocial: Target Goals: Acknowledge presence or absence of significant depression and/or stress, maximize coping skills, provide positive support system. Participant is able to verbalize types and ability to use techniques and skills needed for reducing stress and depression.   Education: Stress, Anxiety, and Depression - Group verbal and visual presentation to define topics covered.  Reviews how body is impacted by stress, anxiety, and depression.  Also discusses healthy ways to reduce stress and to treat/manage anxiety and depression. Written  material provided at class time.   Education: Sleep Hygiene -Provides group verbal and written instruction about how sleep can affect your health.  Define sleep hygiene, discuss sleep cycles and impact of sleep habits. Review good sleep hygiene tips.   Initial Review & Psychosocial Screening:  Initial Psych Review & Screening - 03/23/24 1343       Initial Review   Current issues with None Identified      Family Dynamics   Good Support System? Yes      Barriers   Psychosocial barriers to participate in program There are no identifiable barriers or psychosocial needs.;The patient should benefit from training in stress management and relaxation.      Screening Interventions   Interventions Encouraged to exercise;Provide feedback about the scores to participant;To provide support and resources with identified psychosocial needs    Expected Outcomes Long Term Goal: Stressors or current issues are controlled or eliminated.;Short Term goal: Utilizing psychosocial counselor, staff and physician to assist with identification of specific Stressors or current issues interfering with healing process. Setting desired goal for each stressor or current issue identified.;Short Term goal: Identification and review with participant of any Quality of Life or Depression concerns found by scoring the questionnaire.;Long Term goal: The participant improves quality of Life and PHQ9 Scores as seen by post scores and/or verbalization of changes          Quality of Life Scores:   Scores of 19 and below usually indicate a poorer quality of life in these areas.  A difference of  2-3 points is a clinically meaningful difference.  A difference of 2-3 points in the total score of the Quality of Life Index has been associated with significant improvement in overall quality of life, self-image, physical symptoms, and general health in studies assessing change in quality of life.  PHQ-9: Review Flowsheet  03/29/2024  Depression screen PHQ 2/9  Decreased Interest 0  Down, Depressed, Hopeless 0  PHQ - 2 Score 0  Altered sleeping 2  Tired, decreased energy 1  Change in appetite 0  Feeling bad or failure about yourself  0  Trouble concentrating 0  Moving slowly or fidgety/restless 0  Suicidal thoughts 0  PHQ-9 Score 3  Difficult doing work/chores Not difficult at all   Interpretation of Total Score  Total Score Depression Severity:  1-4 = Minimal depression, 5-9 = Mild depression, 10-14 = Moderate depression, 15-19 = Moderately severe depression, 20-27 = Severe depression   Psychosocial Evaluation and Intervention:  Psychosocial Evaluation - 03/23/24 1352       Psychosocial Evaluation & Interventions   Interventions Encouraged to exercise with the program and follow exercise prescription    Comments Ms. Blackson is coming to cardiac rehab post aortic valve replacement. She states she feels like she has been recovering well besides being dizzy at times. They are working with her blood pressure medications to hopefully manage it better. She enjoys spending time with her horse, dog, and cat as well as staying active. She rides her recumbent bike 5 days a week. When she feels stressed, she takes some B1 and then focuses on doing positive things. She reports no current concerns    Expected Outcomes Short: attend cardiac rehab for education and exercise Long: develop and maitnain positive self care habits.    Continue Psychosocial Services  Follow up required by staff          Psychosocial Re-Evaluation:   Psychosocial Discharge (Final Psychosocial Re-Evaluation):   Vocational Rehabilitation: Provide vocational rehab assistance to qualifying candidates.   Vocational Rehab Evaluation & Intervention:  Vocational Rehab - 03/23/24 1342       Initial Vocational Rehab Evaluation & Intervention   Assessment shows need for Vocational Rehabilitation No          Education: Education  Goals: Education classes will be provided on a variety of topics geared toward better understanding of heart health and risk factor modification. Participant will state understanding/return demonstration of topics presented as noted by education test scores.  Learning Barriers/Preferences:  Learning Barriers/Preferences - 03/23/24 1342       Learning Barriers/Preferences   Learning Barriers None    Learning Preferences None          General Cardiac Education Topics:  AED/CPR: - Group verbal and written instruction with the use of models to demonstrate the basic use of the AED with the basic ABC's of resuscitation.   Test and Procedures: - Group verbal and visual presentation and models provide information about basic cardiac anatomy and function. Reviews the testing methods done to diagnose heart disease and the outcomes of the test results. Describes the treatment choices: Medical Management, Angioplasty, or Coronary Bypass Surgery for treating various heart conditions including Myocardial Infarction, Angina, Valve Disease, and Cardiac Arrhythmias. Written material provided at class time.   Medication Safety: - Group verbal and visual instruction to review commonly prescribed medications for heart and lung disease. Reviews the medication, class of the drug, and side effects. Includes the steps to properly store meds and maintain the prescription regimen. Written material provided at class time.   Intimacy: - Group verbal instruction through game format to discuss how heart and lung disease can affect sexual intimacy. Written material provided at class time.   Know Your Numbers and Heart Failure: - Group verbal and visual instruction to discuss disease risk factors for  cardiac and pulmonary disease and treatment options.  Reviews associated critical values for Overweight/Obesity, Hypertension, Cholesterol, and Diabetes.  Discusses basics of heart failure: signs/symptoms and  treatments.  Introduces Heart Failure Zone chart for action plan for heart failure. Written material provided at class time.   Infection Prevention: - Provides verbal and written material to individual with discussion of infection control including proper hand washing and proper equipment cleaning during exercise session. Flowsheet Row Cardiac Rehab from 03/29/2024 in [facility] Cardiac and Pulmonary Rehab  Date 03/29/24  Educator MJC  Instruction Review Code 1- Verbalizes Understanding    Falls Prevention: - Provides verbal and written material to individual with discussion of falls prevention and safety. Flowsheet Row Cardiac Rehab from 03/29/2024 in [facility] Cardiac and Pulmonary Rehab  Date 03/29/24  Educator MJC  Instruction Review Code 1- Verbalizes Understanding    Other: -Provides group and verbal instruction on various topics (see comments)   Knowledge Questionnaire Score:   Core Components/Risk Factors/Patient Goals at Admission:  Personal Goals and Risk Factors at Admission - 03/23/24 1339       Core Components/Risk Factors/Patient Goals on Admission    Weight Management Weight Loss;Yes    Intervention Weight Management: Develop a combined nutrition and exercise program designed to reach desired caloric intake, while maintaining appropriate intake of nutrient and fiber, sodium and fats, and appropriate energy expenditure required for the weight goal.;Weight Management: Provide education and appropriate resources to help participant work on and attain dietary goals.;Weight Management/Obesity: Establish reasonable short term and long term weight goals.    Goal Weight: Long Term 115 lb (52.2 kg)    Expected Outcomes Short Term: Continue to assess and modify interventions until short term weight is achieved;Long Term: Adherence to nutrition and physical activity/exercise program aimed toward attainment of established weight goal;Weight Loss: Understanding of general recommendations for a  balanced deficit meal plan, which promotes 1-2 lb weight loss per week and includes a negative energy balance of [phone] kcal/d;Understanding recommendations for meals to include 15-35% energy as protein, 25-35% energy from fat, 35-60% energy from carbohydrates, less than 200mg  of dietary cholesterol, 20-35 gm of total fiber daily;Understanding of distribution of calorie intake throughout the day with the consumption of 4-5 meals/snacks    Hypertension Yes    Intervention Provide education on lifestyle modifcations including regular physical activity/exercise, weight management, moderate sodium restriction and increased consumption of fresh fruit, vegetables, and low fat dairy, alcohol moderation, and smoking cessation.;Monitor prescription use compliance.    Expected Outcomes Long Term: Maintenance of blood pressure at goal levels.;Short Term: Continued assessment and intervention until BP is < 140/76mm HG in hypertensive participants. < 130/44mm HG in hypertensive participants with diabetes, heart failure or chronic kidney disease.    Lipids Yes    Intervention Provide education and support for participant on nutrition & aerobic/resistive exercise along with prescribed medications to achieve LDL 70mg , HDL >40mg .    Expected Outcomes Short Term: Participant states understanding of desired cholesterol values and is compliant with medications prescribed. Participant is following exercise prescription and nutrition guidelines.;Long Term: Cholesterol controlled with medications as prescribed, with individualized exercise RX and with personalized nutrition plan. Value goals: LDL < 70mg , HDL > 40 mg.          Education:Diabetes - Individual verbal and written instruction to review signs/symptoms of diabetes, desired ranges of glucose level fasting, after meals and with exercise. Acknowledge that pre and post exercise glucose checks will be done for 3 sessions at entry of program.  Core Components/Risk  Factors/Patient Goals Review:    Core Components/Risk Factors/Patient Goals at Discharge (Final Review):    ITP Comments:  ITP Comments     Row Name 03/23/24 1356 03/29/24 1504 03/30/24 1153 04/05/24 1111 04/27/24 0956   ITP Comments Initial phone call completed. Diagnosis can be found in [facility] 8/26. EP Orientation scheduled for Tuesday 9/23 at 1:30. Completed and gym orientation for cardiac rehab. Initial ITP created and sent for review to Dr. Oneil Pinal, Medical Director. 30 Day review completed. Medical Director ITP review done; changes made as directed and signed approval by Medical Director. New to program. First full day of exercise!  Patient was oriented to gym and equipment including functions, settings, policies, and procedures.  Patient's individual exercise prescription and treatment plan were reviewed.  All starting workloads were established based on the results of the 6 minute walk test done at initial orientation visit.  The plan for exercise progression was also introduced and progression will be customized based on patient's performance and goals. 30 Day review completed. Medical Director ITP review done, changes made as directed, and signed approval by Medical Director.      Comments: 30 day review

## 2024-04-28 ENCOUNTER — Encounter: Admitting: Emergency Medicine

## 2024-04-28 DIAGNOSIS — Z952 Presence of prosthetic heart valve: Secondary | ICD-10-CM

## 2024-04-28 NOTE — Progress Notes (Signed)
 Daily Session Note  Patient Details  Name: Hannah Willis MRN: [id number] Date of Birth: 1951/05/21 Referring Provider:   Flowsheet Row Cardiac Rehab from 03/29/2024 in [facility] Cardiac and Pulmonary Rehab  Referring Provider Dr. Newman Lawrence    Encounter Date: 04/28/2024  Check In:  Session Check In - 04/28/24 1120       Check-In   Supervising physician immediately available to respond to emergencies See telemetry face sheet for immediately available ER MD    Location [facility]-Cardiac & Pulmonary Rehab    Staff Present Rollene Paterson, MS, Exercise Physiologist;Amaiyah Nordhoff RN,BSN;Joseph [facility] BS, Exercise Physiologist    Virtual Visit No    Medication changes reported     No    Fall or balance concerns reported    No    Tobacco Cessation No Change    Warm-up and Cool-down Performed on first and last piece of equipment    Resistance Training Performed Yes    VAD Patient? No    PAD/SET Patient? No      Pain Assessment   Currently in Pain? No/denies             Social History   Tobacco Use  Smoking Status Former   Current packs/day: 0.00   Types: Cigarettes   Quit date: 06/20/1989   Years since quitting: 34.8  Smokeless Tobacco Never  Tobacco Comments   smoked occasional cigarettes for 5 years    Goals Met:  Independence with exercise equipment Exercise tolerated well No report of concerns or symptoms today Strength training completed today  Goals Unmet:  Not Applicable  Comments: Pt able to follow exercise prescription today without complaint.  Will continue to monitor for progression.    Dr. Oneil Pinal is Medical Director for [facility] Cardiac Rehabilitation.  Dr. Fuad Aleskerov is Medical Director for [facility] Pulmonary Rehabilitation.

## 2024-04-29 DIAGNOSIS — I5020 Unspecified systolic (congestive) heart failure: Secondary | ICD-10-CM | POA: Diagnosis not present

## 2024-04-29 DIAGNOSIS — I10 Essential (primary) hypertension: Secondary | ICD-10-CM | POA: Diagnosis not present

## 2024-04-29 DIAGNOSIS — E782 Mixed hyperlipidemia: Secondary | ICD-10-CM | POA: Diagnosis not present

## 2024-04-30 ENCOUNTER — Ambulatory Visit: Payer: Self-pay | Admitting: Cardiology

## 2024-04-30 LAB — BASIC METABOLIC PANEL WITH GFR
BUN/Creatinine Ratio: 24 (ref 12–28)
BUN: 15 mg/dL (ref 8–27)
CO2: 23 mmol/L (ref 20–29)
Calcium: 9.5 mg/dL (ref 8.7–10.3)
Chloride: 102 mmol/L (ref 96–106)
Creatinine, Ser: 0.62 mg/dL (ref 0.57–1.00)
Glucose: 96 mg/dL (ref 70–99)
Potassium: 4 mmol/L (ref 3.5–5.2)
Sodium: 140 mmol/L (ref 134–144)
eGFR: 94 mL/min/1.73 (ref >59)

## 2024-04-30 LAB — LIPID PANEL
Chol/HDL Ratio: 2.2 ratio (ref 0.0–4.4)
Cholesterol, Total: 165 mg/dL (ref 100–199)
HDL: 74 mg/dL (ref >39)
LDL Chol Calc (NIH): 75 mg/dL (ref 0–99)
Triglycerides: 86 mg/dL (ref 0–149)
VLDL Cholesterol Cal: 16 mg/dL (ref 5–40)

## 2024-04-30 LAB — PRO B NATRIURETIC PEPTIDE: NT-Pro BNP: 621 pg/mL — ABNORMAL HIGH (ref 0–301)

## 2024-05-03 ENCOUNTER — Encounter

## 2024-05-03 DIAGNOSIS — Z952 Presence of prosthetic heart valve: Secondary | ICD-10-CM | POA: Diagnosis not present

## 2024-05-03 NOTE — Progress Notes (Signed)
 Daily Session Note  Patient Details  Name: Hannah Willis MRN: [id number] Date of Birth: 11/01/1950 Referring Provider:   Flowsheet Row Cardiac Rehab from 03/29/2024 in [facility] Cardiac and Pulmonary Rehab  Referring Provider Dr. Newman Lawrence    Encounter Date: 05/03/2024  Check In:  Session Check In - 05/03/24 1051       Check-In   Supervising physician immediately available to respond to emergencies See telemetry face sheet for immediately available ER MD    Location [facility]-Cardiac & Pulmonary Rehab    Staff Present Burnard Davenport RN,BSN,MPA;Meredith Tressa RN,BSN;Maxon Conetta BS, Exercise Physiologist;Jason Elnor RDN,LDN    Virtual Visit No    Medication changes reported     No    Fall or balance concerns reported    No    Tobacco Cessation No Change    Warm-up and Cool-down Performed on first and last piece of equipment    Resistance Training Performed Yes    VAD Patient? No    PAD/SET Patient? No      Pain Assessment   Currently in Pain? No/denies             Social History   Tobacco Use  Smoking Status Former   Current packs/day: 0.00   Types: Cigarettes   Quit date: 06/20/1989   Years since quitting: 34.8  Smokeless Tobacco Never  Tobacco Comments   smoked occasional cigarettes for 5 years    Goals Met:  Independence with exercise equipment Exercise tolerated well No report of concerns or symptoms today Strength training completed today  Goals Unmet:  Not Applicable  Comments: Pt able to follow exercise prescription today without complaint.  Will continue to monitor for progression.    Dr. Oneil Pinal is Medical Director for [facility] Cardiac Rehabilitation.  Dr. Fuad Aleskerov is Medical Director for [facility] Pulmonary Rehabilitation.

## 2024-05-04 ENCOUNTER — Ambulatory Visit (INDEPENDENT_AMBULATORY_CARE_PROVIDER_SITE_OTHER)

## 2024-05-04 VITALS — BP 158/87 | HR 52 | Temp 97.9°F | Resp 18 | Ht 62.0 in | Wt 131.4 lb

## 2024-05-04 DIAGNOSIS — K5130 Ulcerative (chronic) rectosigmoiditis without complications: Secondary | ICD-10-CM

## 2024-05-04 MED ORDER — VEDOLIZUMAB 300 MG IV SOLR
300.0000 mg | Freq: Once | INTRAVENOUS | Status: AC
  Administered 2024-05-04: 300 mg via INTRAVENOUS
  Filled 2024-05-04: qty 5

## 2024-05-04 NOTE — Progress Notes (Signed)
 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 AVS Declined  Performed by:  Adriannah Steinkamp, RN

## 2024-05-05 ENCOUNTER — Encounter: Admitting: Emergency Medicine

## 2024-05-05 DIAGNOSIS — Z952 Presence of prosthetic heart valve: Secondary | ICD-10-CM | POA: Diagnosis not present

## 2024-05-05 NOTE — Progress Notes (Signed)
 Daily Session Note  Patient Details  Name: Ressie Slevin MRN: [id number] Date of Birth: [birth year] Referring Provider:   Flowsheet Row Cardiac Rehab from 03/29/2024 in [facility] Cardiac and Pulmonary Rehab  Referring Provider Dr. Newman Lawrence    Encounter Date: 05/05/2024  Check In:  Session Check In - 05/05/24 1103       Check-In   Supervising physician immediately available to respond to emergencies See telemetry face sheet for immediately available ER MD    Location [facility]-Cardiac & Pulmonary Rehab    Staff Present Fairy Plater RCP,RRT,BSRT;Maxon Conetta BS, Exercise Physiologist;Sophronia Varney RN,BSN;Margaret Best, MS, Exercise Physiologist;Noah Tickle, BS, Exercise Physiologist    Virtual Visit No    Medication changes reported     No    Fall or balance concerns reported    No    Tobacco Cessation No Change    Warm-up and Cool-down Performed on first and last piece of equipment    Resistance Training Performed Yes    VAD Patient? No    PAD/SET Patient? No      Pain Assessment   Currently in Pain? No/denies             Social History   Tobacco Use  Smoking Status Former   Current packs/day: 0.00   Types: Cigarettes   Quit date: 06/20/1989   Years since quitting: 34.8  Smokeless Tobacco Never  Tobacco Comments   smoked occasional cigarettes for 5 years    Goals Met:  Independence with exercise equipment Exercise tolerated well No report of concerns or symptoms today Strength training completed today  Goals Unmet:  Not Applicable  Comments: Pt able to follow exercise prescription today without complaint.  Will continue to monitor for progression.    Dr. Oneil Pinal is Medical Director for [facility] Cardiac Rehabilitation.  Dr. Fuad Aleskerov is Medical Director for [facility] Pulmonary Rehabilitation.

## 2024-05-10 ENCOUNTER — Encounter: Attending: Cardiology | Admitting: *Deleted

## 2024-05-10 DIAGNOSIS — Z952 Presence of prosthetic heart valve: Secondary | ICD-10-CM | POA: Diagnosis not present

## 2024-05-10 DIAGNOSIS — Z48812 Encounter for surgical aftercare following surgery on the circulatory system: Secondary | ICD-10-CM | POA: Diagnosis present

## 2024-05-10 NOTE — Progress Notes (Signed)
 Daily Session Note  Patient Details  Name: Hannah Willis MRN: [id number] Date of Birth: 07/12/1950 Referring Provider:   Flowsheet Row Cardiac Rehab from 03/29/2024 in [facility] Cardiac and Pulmonary Rehab  Referring Provider Dr. Newman Lawrence    Encounter Date: 05/10/2024  Check In:  Session Check In - 05/10/24 1148       Check-In   Supervising physician immediately available to respond to emergencies See telemetry face sheet for immediately available ER MD    Location [facility]-Cardiac & Pulmonary Rehab    Staff Present Ronal Idell Glen, RN, BSN, MA;Maxon Conetta BS, Exercise Physiologist;Jason Elnor RDN,LDN;Meredith Tressa RN,BSN    Virtual Visit No    Medication changes reported     No    Fall or balance concerns reported    No    Tobacco Cessation No Change    Warm-up and Cool-down Performed on first and last piece of equipment    Resistance Training Performed Yes    VAD Patient? No    PAD/SET Patient? No             Social History   Tobacco Use  Smoking Status Former   Current packs/day: 0.00   Types: Cigarettes   Quit date: 06/20/1989   Years since quitting: 34.9  Smokeless Tobacco Never  Tobacco Comments   smoked occasional cigarettes for 5 years    Goals Met:  Independence with exercise equipment Exercise tolerated well No report of concerns or symptoms today  Goals Unmet:  Not Applicable  Comments: Pt able to follow exercise prescription today without complaint.  Will continue to monitor for progression.    Dr. Oneil Pinal is Medical Director for [facility] Cardiac Rehabilitation.  Dr. Fuad Aleskerov is Medical Director for [facility] Pulmonary Rehabilitation.

## 2024-05-10 NOTE — Progress Notes (Signed)
 Daily Session Note  Patient Details  Name: Hannah Willis MRN: [id number] Date of Birth: 11-Jan-1951 Referring Provider:   Flowsheet Row Cardiac Rehab from 03/29/2024 in [facility] Cardiac and Pulmonary Rehab  Referring Provider Dr. Newman Lawrence    Encounter Date: 05/10/2024  Check In:  Session Check In - 05/10/24 1313       Check-In   Supervising physician immediately available to respond to emergencies See telemetry face sheet for immediately available ER MD    Location [facility]-Cardiac & Pulmonary Rehab    Staff Present Ronal Idell Glen, RN, BSN, MA;Maxon Conetta BS, Exercise Physiologist;Meredith Tressa RN,BSN;Jason Elnor [facility]    Virtual Visit No    Medication changes reported     No    Fall or balance concerns reported    No    Tobacco Cessation No Change    Warm-up and Cool-down Performed on first and last piece of equipment    Resistance Training Performed Yes    VAD Patient? No    PAD/SET Patient? No      Pain Assessment   Currently in Pain? No/denies             Social History   Tobacco Use  Smoking Status Former   Current packs/day: 0.00   Types: Cigarettes   Quit date: 06/20/1989   Years since quitting: 34.9  Smokeless Tobacco Never  Tobacco Comments   smoked occasional cigarettes for 5 years    Goals Met:  Independence with exercise equipment Exercise tolerated well No report of concerns or symptoms today  Goals Unmet:  Not Applicable  Comments: Pt able to follow exercise prescription today without complaint.  Will continue to monitor for progression.    Dr. Oneil Pinal is Medical Director for [facility] Cardiac Rehabilitation.  Dr. Fuad Aleskerov is Medical Director for [facility] Pulmonary Rehabilitation.

## 2024-05-12 ENCOUNTER — Encounter: Admitting: Emergency Medicine

## 2024-05-12 DIAGNOSIS — Z952 Presence of prosthetic heart valve: Secondary | ICD-10-CM

## 2024-05-12 DIAGNOSIS — Z48812 Encounter for surgical aftercare following surgery on the circulatory system: Secondary | ICD-10-CM | POA: Diagnosis not present

## 2024-05-12 NOTE — Progress Notes (Signed)
 Daily Session Note  Patient Details  Name: Hannah Willis MRN: [id number] Date of Birth: Dec 30, 1950 Referring Provider:   Flowsheet Row Cardiac Rehab from 03/29/2024 in [facility] Cardiac and Pulmonary Rehab  Referring Provider Dr. Newman Lawrence    Encounter Date: 05/12/2024  Check In:  Session Check In - 05/12/24 1124       Check-In   Supervising physician immediately available to respond to emergencies See telemetry face sheet for immediately available ER MD    Location [facility]-Cardiac & Pulmonary Rehab    Staff Present Leita Franks RN,BSN;Joseph [facility] BS, Exercise Physiologist;Jason Elnor RDN,LDN    Virtual Visit No    Medication changes reported     No    Fall or balance concerns reported    No    Tobacco Cessation No Change    Warm-up and Cool-down Performed on first and last piece of equipment    Resistance Training Performed Yes    VAD Patient? No    PAD/SET Patient? No      Pain Assessment   Currently in Pain? No/denies             Social History   Tobacco Use  Smoking Status Former   Current packs/day: 0.00   Types: Cigarettes   Quit date: 06/20/1989   Years since quitting: 34.9  Smokeless Tobacco Never  Tobacco Comments   smoked occasional cigarettes for 5 years    Goals Met:  Independence with exercise equipment Exercise tolerated well No report of concerns or symptoms today Strength training completed today  Goals Unmet:  Not Applicable  Comments: Pt able to follow exercise prescription today without complaint.  Will continue to monitor for progression.    Dr. Oneil Pinal is Medical Director for [facility] Cardiac Rehabilitation.  Dr. Fuad Aleskerov is Medical Director for [facility] Pulmonary Rehabilitation.

## 2024-05-13 ENCOUNTER — Telehealth: Payer: Self-pay | Admitting: Gastroenterology

## 2024-05-13 NOTE — Telephone Encounter (Signed)
 Spoke with pt and let he know to take the paperwork to the infusion center.

## 2024-05-13 NOTE — Telephone Encounter (Signed)
 Inbound call from patient stating that she is needing to know weather she is needing to bring her tax information over to her entyvio  appointment or here to our office. Patient is requesting a call back.Please advise.

## 2024-05-16 ENCOUNTER — Telehealth: Payer: Self-pay | Admitting: Cardiology

## 2024-05-16 MED ORDER — AMOXICILLIN 500 MG PO TABS: 2000.0000 mg | ORAL_TABLET | ORAL | 0 refills | Status: DC

## 2024-05-16 NOTE — Telephone Encounter (Signed)
 Left message advising to call back and prescription was sent in.

## 2024-05-16 NOTE — Telephone Encounter (Signed)
 Pt called in asking if she can have an antibiotic prior to her dental cleaning on Thursday.    [id number] [place], [place] - [address] AT [facility] [address] [facility] Phone: [phone]  Fax: [phone]

## 2024-05-16 NOTE — Telephone Encounter (Signed)
 Please send amoxicillin 2000 mg to be taken 30 to 60 minutes before dental procedure.  Thanks MJP

## 2024-05-17 ENCOUNTER — Encounter

## 2024-05-17 DIAGNOSIS — Z952 Presence of prosthetic heart valve: Secondary | ICD-10-CM

## 2024-05-17 DIAGNOSIS — Z48812 Encounter for surgical aftercare following surgery on the circulatory system: Secondary | ICD-10-CM | POA: Diagnosis not present

## 2024-05-17 NOTE — Telephone Encounter (Signed)
 Pt contacted and advised. Pt agrees with plan of care.

## 2024-05-17 NOTE — Progress Notes (Signed)
 Daily Session Note  Patient Details  Name: Hannah Willis MRN: [id number] Date of Birth: 1950/10/28 Referring Provider:   Flowsheet Row Cardiac Rehab from 03/29/2024 in [facility] Cardiac and Pulmonary Rehab  Referring Provider Dr. Newman Lawrence    Encounter Date: 05/17/2024  Check In:  Session Check In - 05/17/24 1053       Check-In   Staff Present Selinda Pereyra RDN,LDN;Noah Tickle, BS, Exercise Physiologist;Maxon Conetta BS, Exercise Physiologist;Ikran Patman RN,BSN,MPA;Mary Godley, RN, DNP, NE-BC    Virtual Visit No    Medication changes reported     No    Fall or balance concerns reported    No    Tobacco Cessation No Change    Warm-up and Cool-down Performed on first and last piece of equipment    Resistance Training Performed Yes    VAD Patient? No    PAD/SET Patient? No      Pain Assessment   Currently in Pain? No/denies             Social History   Tobacco Use  Smoking Status Former   Current packs/day: 0.00   Types: Cigarettes   Quit date: 06/20/1989   Years since quitting: 34.9  Smokeless Tobacco Never  Tobacco Comments   smoked occasional cigarettes for 5 years    Goals Met:  Independence with exercise equipment Exercise tolerated well No report of concerns or symptoms today Strength training completed today  Goals Unmet:  Not Applicable  Comments: Pt able to follow exercise prescription today without complaint.  Will continue to monitor for progression.    Dr. Oneil Pinal is Medical Director for [facility] Cardiac Rehabilitation.  Dr. Fuad Aleskerov is Medical Director for [facility] Pulmonary Rehabilitation.

## 2024-05-19 ENCOUNTER — Encounter: Admitting: *Deleted

## 2024-05-19 DIAGNOSIS — Z48812 Encounter for surgical aftercare following surgery on the circulatory system: Secondary | ICD-10-CM | POA: Diagnosis not present

## 2024-05-19 DIAGNOSIS — Z952 Presence of prosthetic heart valve: Secondary | ICD-10-CM

## 2024-05-19 NOTE — Progress Notes (Signed)
 Daily Session Note  Patient Details  Name: Hannah Willis MRN: [id number] Date of Birth: 10/16/1950 Referring Provider:   Flowsheet Row Cardiac Rehab from 03/29/2024 in [facility] Cardiac and Pulmonary Rehab  Referring Provider Dr. Newman Lawrence    Encounter Date: 05/19/2024  Check In:  Session Check In - 05/19/24 1344       Check-In   Supervising physician immediately available to respond to emergencies See telemetry face sheet for immediately available ER MD    Location [facility]-Cardiac & Pulmonary Rehab    Staff Present Hoy Rodney RN,BSN;Joseph Rolinda NORWOOD HARMAN Verlie Laird, [place], Exercise Physiologist;Margaret Best, MS, Exercise Physiologist    Virtual Visit No    Medication changes reported     No    Fall or balance concerns reported    No    Warm-up and Cool-down Performed on first and last piece of equipment    Resistance Training Performed Yes    VAD Patient? No    PAD/SET Patient? No      Pain Assessment   Currently in Pain? No/denies             Social History   Tobacco Use  Smoking Status Former   Current packs/day: 0.00   Types: Cigarettes   Quit date: 06/20/1989   Years since quitting: 34.9  Smokeless Tobacco Never  Tobacco Comments   smoked occasional cigarettes for 5 years    Goals Met:  Independence with exercise equipment Exercise tolerated well No report of concerns or symptoms today Strength training completed today  Goals Unmet:  Not Applicable  Comments: Pt able to follow exercise prescription today without complaint.  Will continue to monitor for progression.    Dr. Oneil Pinal is Medical Director for [facility] Cardiac Rehabilitation.  Dr. Fuad Aleskerov is Medical Director for [facility] Pulmonary Rehabilitation.

## 2024-05-24 ENCOUNTER — Encounter: Admitting: *Deleted

## 2024-05-24 DIAGNOSIS — Z952 Presence of prosthetic heart valve: Secondary | ICD-10-CM

## 2024-05-24 DIAGNOSIS — Z48812 Encounter for surgical aftercare following surgery on the circulatory system: Secondary | ICD-10-CM | POA: Diagnosis not present

## 2024-05-24 NOTE — Progress Notes (Signed)
 Daily Session Note  Patient Details  Name: Hannah Willis MRN: [id number] Date of Birth: [birth year] Referring Provider:   Flowsheet Row Cardiac Rehab from 03/29/2024 in [facility] Cardiac and Pulmonary Rehab  Referring Provider Dr. Newman Lawrence    Encounter Date: 05/24/2024  Check In:  Session Check In - 05/24/24 1109       Check-In   Supervising physician immediately available to respond to emergencies See telemetry face sheet for immediately available ER MD    Location [facility]-Cardiac & Pulmonary Rehab    Staff Present Selinda Pereyra RDN,LDN;Maxon Conetta BS, Exercise Physiologist;Kelly Bollinger RN,BSN,MPA;Meredith Tressa RN,BSN;Shamarra Warda Peggi, RN, DNP, NE-BC    Virtual Visit No    Medication changes reported     No    Fall or balance concerns reported    No    Warm-up and Cool-down Performed on first and last piece of equipment    Resistance Training Performed Yes    VAD Patient? No    PAD/SET Patient? No      Pain Assessment   Currently in Pain? No/denies             Social History   Tobacco Use  Smoking Status Former   Current packs/day: 0.00   Types: Cigarettes   Quit date: 06/20/1989   Years since quitting: 34.9  Smokeless Tobacco Never  Tobacco Comments   smoked occasional cigarettes for 5 years    Goals Met:  Independence with exercise equipment Exercise tolerated well No report of concerns or symptoms today Strength training completed today  Goals Unmet:  Not Applicable  Comments: Pt able to follow exercise prescription today without complaint.  Will continue to monitor for progression.    Dr. Oneil Pinal is Medical Director for [facility] Cardiac Rehabilitation.  Dr. Fuad Aleskerov is Medical Director for [facility] Pulmonary Rehabilitation.

## 2024-05-25 DIAGNOSIS — Z952 Presence of prosthetic heart valve: Secondary | ICD-10-CM

## 2024-05-25 NOTE — Progress Notes (Signed)
 Cardiac Individual Treatment Plan  Patient Details  Name: Hannah Willis MRN: [id number] Date of Birth: [birth year] Referring Provider:   Flowsheet Row Cardiac Rehab from 03/29/2024 in [facility] Cardiac and Pulmonary Rehab  Referring Provider Dr. Newman Lawrence    Initial Encounter Date:  Flowsheet Row Cardiac Rehab from 03/29/2024 in [facility] Cardiac and Pulmonary Rehab  Date 03/29/24    Visit Diagnosis: S/P TAVR (transcatheter aortic valve replacement)  Patient's Home Medications on Admission:  Current Outpatient Medications:    acetaminophen  (TYLENOL ) 500 MG tablet, Take 1-2 tablets (500-1,000 mg total) by mouth every 6 (six) hours as needed., Disp: , Rfl:    alendronate (FOSAMAX) 70 MG tablet, Take 70 mg by mouth once a week. Take with a full glass of water  on an empty stomach., Disp: , Rfl:    amoxicillin (AMOXIL) 500 MG tablet, Take 4 tablets (2,000 mg total) by mouth as directed. to be taken 30 to 60 minutes before dental procedure, Disp: 4 tablet, Rfl: 0   apixaban  (ELIQUIS ) 5 MG TABS tablet, Take 1 tablet (5 mg total) by mouth 2 (two) times daily., Disp: 180 tablet, Rfl: 1   Apoaequorin (PREVAGEN PO), Take 1 tablet by mouth daily., Disp: , Rfl:    calcium  carbonate (OSCAL) 1500 (600 Ca) MG TABS tablet, Take by mouth 2 (two) times daily with a meal., Disp: , Rfl:    Calcium  Carbonate-Vit D-Min (CALCIUM  1200 PO), Take 1 tablet by mouth daily at 6 (six) AM., Disp: , Rfl:    empagliflozin  (JARDIANCE ) 10 MG TABS tablet, Take 1 tablet (10 mg total) by mouth daily before breakfast., Disp: 90 tablet, Rfl: 3   ferrous sulfate  325 (65 FE) MG tablet, Take 325 mg by mouth daily., Disp: , Rfl:    losartan (COZAAR) 50 MG tablet, Take 1 tablet (50 mg total) by mouth daily., Disp: 90 tablet, Rfl: 3   metoprolol  succinate (TOPROL -XL) 100 MG 24 hr tablet, Take 1 tablet (100 mg total) by mouth daily. Take with or immediately following a meal., Disp: 90 tablet, Rfl: 3   ondansetron  (ZOFRAN ) 4 MG  tablet, Take 1 tablet (4 mg total) by mouth every 8 (eight) hours as needed for nausea or vomiting., Disp: 90 tablet, Rfl: 1   rosuvastatin  (CRESTOR ) 20 MG tablet, Take 20 mg by mouth daily., Disp: , Rfl:    thiamine  (VITAMIN B-1) 100 MG tablet, Take 100 mg by mouth daily., Disp: , Rfl:    traMADol  (ULTRAM ) 50 MG tablet, Take 1 tablet (50 mg total) by mouth every 6 (six) hours as needed for severe pain (pain score 7-10)., Disp: 28 tablet, Rfl: 0   Vedolizumab  (ENTYVIO  IV), Inject 1 Dose as directed every 8 (eight) weeks. Experimental every two months, given at the MD office, Disp: , Rfl:    VITAMIN D  PO, Take 1 tablet by mouth daily., Disp: , Rfl:   Past Medical History: Past Medical History:  Diagnosis Date   Allergy    Apnea, sleep    no c-pap machine   Arthritis    OA   Basal cell carcinoma    on front chest   Blood in stool    LAST 2 MONTHS, MEDS CHNAGED FOR   GERD (gastroesophageal reflux disease)    Heart murmur    HFrEF (heart failure with reduced ejection fraction) (HCC) 01/20/2024   Hyperlipidemia    Hypertension    Kidney stones    Laryngitis    started monday night   Neuromuscular disorder (HCC)  Post-operative nausea and vomiting    Severe aortic insufficiency    Severe aortic regurgitation 12/21/2023   Spherocytosis (familial)    Stroke (HCC) 09/21/2007   Synovitis of right hand 12/29/2015   and wrist   Thoracic aortic aneurysm    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   Ulcerative colitis (HCC)    Weakness of right hand    due to stroke    Tobacco Use: Social History   Tobacco Use  Smoking Status Former   Current packs/day: 0.00   Types: Cigarettes   Quit date: 06/20/1989   Years since quitting: 34.9  Smokeless Tobacco Never  Tobacco Comments   smoked occasional cigarettes for 5 years    Labs: Review Flowsheet  More data exists      Latest Ref Rng & Units 04/04/2015 12/25/2023 02/05/2024 02/08/2024 04/29/2024  Labs for ITP Cardiac and Pulmonary  Rehab  Cholestrol 100 - 199 mg/dL 798  - - - 834   LDL (calc) 0 - 99 mg/dL 885  - - - 75   HDL-C >60 mg/dL 69  - - - 74   Trlycerides 0 - 149 mg/dL 89  - - - 86   Hemoglobin A1c 4.8 - 5.6 % 5.4  - 5.4  - -  PH, Arterial 7.35 - 7.45 - 7.432  - 7.375  7.364  7.381  7.433  7.467  7.484  -  PCO2 arterial 32 - 48 mmHg - 33.0  - 36.8  40.3  35.7  33.3  32.2  28.5  -  Bicarbonate 20.0 - 28.0 mmol/L - 23.7  23.9  22.0  - 21.4  22.8  21.6  22.3  23.3  23.3  21.4  -  TCO2 22 - 32 mmol/L - 25  25  23   - 23  24  23  23  26  27  24  26  26  27  24  22  22  22   -  Acid-base deficit 0.0 - 2.0 mmol/L - 1.0  1.0  2.0  - 3.0  2.0  4.0  2.0  1.0  2.0  -  O2 Saturation % - 66  72  97  - 97  97  97  100  100  72  100  -    Details       Multiple values from one day are sorted in reverse-chronological order          Exercise Target Goals: Exercise Program Goal: Individual exercise prescription set using results from initial 6 min walk test and THRR while considering  patient's activity barriers and safety.   Exercise Prescription Goal: Initial exercise prescription builds to 30-45 minutes a day of aerobic activity, 2-3 days per week.  Home exercise guidelines will be given to patient during program as part of exercise prescription that the participant will acknowledge.   Education: Aerobic Exercise: - Group verbal and visual presentation on the components of exercise prescription. Introduces F.I.T.T principle from ACSM for exercise prescriptions.  Reviews F.I.T.T. principles of aerobic exercise including progression. Written material provided at class time.   Education: Resistance Exercise: - Group verbal and visual presentation on the components of exercise prescription. Introduces F.I.T.T principle from ACSM for exercise prescriptions  Reviews F.I.T.T. principles of resistance exercise including progression. Written material provided at class time.    Education: Exercise & Equipment Safety: -  Individual verbal instruction and demonstration of equipment use and safety with use of the equipment. Flowsheet Row Cardiac Rehab from  03/29/2024 in [facility] Cardiac and Pulmonary Rehab  Date 03/29/24  Educator MJC  Instruction Review Code 1- Verbalizes Understanding    Education: Exercise Physiology & General Exercise Guidelines: - Group verbal and written instruction with models to review the exercise physiology of the cardiovascular system and associated critical values. Provides general exercise guidelines with specific guidelines to those with heart or lung disease. Written material provided at class time.   Education: Flexibility, Balance, Mind/Body Relaxation: - Group verbal and visual presentation with interactive activity on the components of exercise prescription. Introduces F.I.T.T principle from ACSM for exercise prescriptions. Reviews F.I.T.T. principles of flexibility and balance exercise training including progression. Also discusses the mind body connection.  Reviews various relaxation techniques to help reduce and manage stress (i.e. Deep breathing, progressive muscle relaxation, and visualization). Balance handout provided to take home. Written material provided at class time.   Activity Barriers & Risk Stratification:  Activity Barriers & Cardiac Risk Stratification - 03/29/24 1506       Activity Barriers & Cardiac Risk Stratification   Activity Barriers Left Knee Replacement;Right Knee Replacement;Joint Problems    Cardiac Risk Stratification Moderate          6 Minute Walk:  6 Minute Walk     Row Name 03/29/24 1504         6 Minute Walk   Phase Initial     Distance 1135 feet     Walk Time 6 minutes     # of Rest Breaks 0     MPH 2.14     METS 2.46     RPE 11     Perceived Dyspnea  1     VO2 Peak 8.6     Symptoms No     Resting HR 60 bpm     Resting BP 126/68     Resting Oxygen Saturation  90 %     Exercise Oxygen Saturation  during 6 min walk 90 %      Max Ex. HR 83 bpm     Max Ex. BP 148/64     2 Minute Post BP 124/62        Oxygen Initial Assessment:   Oxygen Re-Evaluation:   Oxygen Discharge (Final Oxygen Re-Evaluation):   Initial Exercise Prescription:  Initial Exercise Prescription - 03/29/24 1500       Date of Initial Exercise RX and Referring Provider   Date 03/29/24    Referring Provider Dr. Newman Lawrence      Oxygen   Maintain Oxygen Saturation 88% or higher      Treadmill   MPH 2.2    Grade 0    Minutes 15    METs 2.68      Recumbant Bike   Level 2    RPM 50    Watts 25    Minutes 15    METs 2.46      NuStep   Level 2    SPM 80    Minutes 15    METs 2.46      T5 Nustep   Level 2    SPM 80    Minutes 15    METs 2.46      Biostep-RELP   Level 2    SPM 50    Minutes 15    METs 2.46      Track   Laps 30    Minutes 15    METs 2.63      Prescription Details   Duration Progress to  30 minutes of continuous aerobic without signs/symptoms of physical distress      Intensity   THRR 40-80% of Max Heartrate 94129    Ratings of Perceived Exertion 11-13    Perceived Dyspnea 0-4      Progression   Progression Continue to progress workloads to maintain intensity without signs/symptoms of physical distress.      Resistance Training   Training Prescription Yes    Weight 2lb    Reps 10-15          Perform Capillary Blood Glucose checks as needed.  Exercise Prescription Changes:   Exercise Prescription Changes     Row Name 03/29/24 1500 04/13/24 1100 04/29/24 0700 05/12/24 1600 05/24/24 1500     Response to Exercise   Blood Pressure (Admit) 126/68 104/62 118/64 134/78 118/66   Blood Pressure (Exercise) 148/64 130/62 128/70 142/60 138/70   Blood Pressure (Exit) 124/62 106/58 118/62 114/60 98/60   Heart Rate (Admit) 60 bpm 58 bpm 70 bpm 63 bpm 70 bpm   Heart Rate (Exercise) 83 bpm 85 bpm 112 bpm 107 bpm 93 bpm   Heart Rate (Exit) 59 bpm 61 bpm 76 bpm 59 bpm 68 bpm   Oxygen  Saturation (Admit) 90 % -- -- -- --   Oxygen Saturation (Exercise) 90 % -- -- -- --   Oxygen Saturation (Exit) 90 % -- -- -- --   Rating of Perceived Exertion (Exercise) 11 13 13 14 13    Perceived Dyspnea (Exercise) 1 -- -- -- --   Symptoms none none none none none   Comments results first 2 weeks of exercise -- -- --   Duration -- Progress to 30 minutes of  aerobic without signs/symptoms of physical distress Continue with 30 min of aerobic exercise without signs/symptoms of physical distress. Continue with 30 min of aerobic exercise without signs/symptoms of physical distress. Continue with 30 min of aerobic exercise without signs/symptoms of physical distress.   Intensity -- THRR unchanged THRR unchanged THRR unchanged THRR unchanged     Progression   Progression -- Continue to progress workloads to maintain intensity without signs/symptoms of physical distress. Continue to progress workloads to maintain intensity without signs/symptoms of physical distress. Continue to progress workloads to maintain intensity without signs/symptoms of physical distress. Continue to progress workloads to maintain intensity without signs/symptoms of physical distress.   Average METs -- 2.79 2.73 2.8 2.68     Resistance Training   Training Prescription -- Yes Yes Yes Yes   Weight -- 2lb 2 lb 2 lb 2 lb   Reps -- 10-15 10-15 10-15 10-15     Interval Training   Interval Training -- No No No No     Treadmill   MPH -- 2.3 2.4 2.4 2.2   Grade -- 0 0 0 0   Minutes -- 15 15 15 15    METs -- 2.3 2.84 2.84 2.69     Recumbant Bike   Level -- -- -- 2 2   Watts -- -- -- 25 19   Minutes -- -- -- 15 15   METs -- -- -- 3.31 3.33     NuStep   Level -- 3 5  T6: level 3 4 3    Minutes -- 15 15 15 15    METs -- 2.9 3.3  T6: 2.2 METs 3 2.1     T5 Nustep   Level -- -- -- -- 3   Minutes -- -- -- -- 15   METs -- -- -- --  2     Biostep-RELP   Level -- -- -- 3 2   Minutes -- -- -- 15 15   METs -- -- -- 2 3      Oxygen   Maintain Oxygen Saturation -- 88% or higher 88% or higher 88% or higher 88% or higher      Exercise Comments:   Exercise Comments     Row Name 04/05/24 1111           Exercise Comments First full day of exercise!  Patient was oriented to gym and equipment including functions, settings, policies, and procedures.  Patient's individual exercise prescription and treatment plan were reviewed.  All starting workloads were established based on the results of the 6 minute walk test done at initial orientation visit.  The plan for exercise progression was also introduced and progression will be customized based on patient's performance and goals.          Exercise Goals and Review:   Exercise Goals     Row Name 03/29/24 1510             Exercise Goals   Increase Physical Activity Yes       Intervention Provide advice, education, support and counseling about physical activity/exercise needs.;Develop an individualized exercise prescription for aerobic and resistive training based on initial evaluation findings, risk stratification, comorbidities and participant's personal goals.       Expected Outcomes Long Term: Exercising regularly at least 3-5 days a week.;Long Term: Add in home exercise to make exercise part of routine and to increase amount of physical activity.;Short Term: Attend rehab on a regular basis to increase amount of physical activity.       Increase Strength and Stamina Yes       Intervention Provide advice, education, support and counseling about physical activity/exercise needs.;Develop an individualized exercise prescription for aerobic and resistive training based on initial evaluation findings, risk stratification, comorbidities and participant's personal goals.       Expected Outcomes Short Term: Increase workloads from initial exercise prescription for resistance, speed, and METs.;Short Term: Perform resistance training exercises routinely during rehab and  add in resistance training at home;Long Term: Improve cardiorespiratory fitness, muscular endurance and strength as measured by increased METs and functional capacity ( )       Able to understand and use rate of perceived exertion (RPE) scale Yes       Intervention Provide education and explanation on how to use RPE scale       Expected Outcomes Long Term:  Able to use RPE to guide intensity level when exercising independently;Short Term: Able to use RPE daily in rehab to express subjective intensity level       Able to understand and use Dyspnea scale Yes       Intervention Provide education and explanation on how to use Dyspnea scale       Expected Outcomes Short Term: Able to use Dyspnea scale daily in rehab to express subjective sense of shortness of breath during exertion;Long Term: Able to use Dyspnea scale to guide intensity level when exercising independently       Knowledge and understanding of Target Heart Rate Range (THRR) Yes       Intervention Provide education and explanation of THRR including how the numbers were predicted and where they are located for reference       Expected Outcomes Short Term: Able to state/look up THRR;Short Term: Able to use daily as guideline for intensity in rehab;Long  Term: Able to use THRR to govern intensity when exercising independently       Able to check pulse independently Yes       Intervention Provide education and demonstration on how to check pulse in carotid and radial arteries.;Review the importance of being able to check your own pulse for safety during independent exercise       Expected Outcomes Short Term: Able to explain why pulse checking is important during independent exercise;Long Term: Able to check pulse independently and accurately       Understanding of Exercise Prescription Yes       Intervention Provide education, explanation, and written materials on patient's individual exercise prescription       Expected Outcomes Short Term:  Able to explain program exercise prescription;Long Term: Able to explain home exercise prescription to exercise independently          Exercise Goals Re-Evaluation :  Exercise Goals Re-Evaluation     Row Name 04/05/24 1111 04/13/24 1143 04/29/24 0750 05/12/24 1636 05/24/24 1508     Exercise Goal Re-Evaluation   Exercise Goals Review Increase Physical Activity;Able to understand and use rate of perceived exertion (RPE) scale;Knowledge and understanding of Target Heart Rate Range (THRR);Understanding of Exercise Prescription;Increase Strength and Stamina;Able to understand and use Dyspnea scale;Able to check pulse independently Increase Physical Activity;Increase Strength and Stamina;Understanding of Exercise Prescription Increase Physical Activity;Increase Strength and Stamina;Understanding of Exercise Prescription Increase Physical Activity;Increase Strength and Stamina;Understanding of Exercise Prescription Increase Physical Activity;Increase Strength and Stamina;Understanding of Exercise Prescription   Comments Reviewed RPE and dyspnea scale, THR and program prescription with pt today.  Pt voiced understanding and was given a copy of goals to take home. Kamari is off to a good start in the program, and was able to attend her first two sessions during this review period. During these sessions she was able to increase her treadmill workload to 2. and no incline, and increase to level 3 on the T4 nustep. We will continue to monitor her progress in the program. Jahniah is doing well in rehab. She recently increased her speed on the treadmill to 2.4 mph with no incline. She also improved to level 5 on the T4 nustep and level 3 on the T6 nustep. We will continue to monitor her progress in the program. Zaharah continues to do well in the program. She has been able to increase from level 2 to 3 on the biostep. She was also able to maintain her treadmill workload at 2. and no incline. We will continue  to monitor her progress in the program. Zetta is doing well in rehab. She recently improved to level 3 on the T5 nustep. She also decreased her speed on the treadmill from 2.4 mph to 2.2 mph on the treadmill. We will continue to monitor her progress in the program.   Expected Outcomes Short: Use RPE daily to regulate intensity. Long: Follow program prescription in THR. Short: Continue to follow exercise prescription. Long: Continue exercise to improve strength and stamina. Short: Continue to progressively increase workloads. Long: Continue exercise to improve strength and stamina. Short: Increase treadmill incline to 1%. Long: Continue exercise to improve strength and stamina. Short: Increase treadmill speed back up to 2.4 mph. Long: Continue exercise to improve strength and stamina.      Discharge Exercise Prescription (Final Exercise Prescription Changes):  Exercise Prescription Changes - 05/24/24 1500       Response to Exercise   Blood Pressure (Admit) 118/66    Blood  Pressure (Exercise) 138/70    Blood Pressure (Exit) 98/60    Heart Rate (Admit) 70 bpm    Heart Rate (Exercise) 93 bpm    Heart Rate (Exit) 68 bpm    Rating of Perceived Exertion (Exercise) 13    Symptoms none    Duration Continue with 30 min of aerobic exercise without signs/symptoms of physical distress.    Intensity THRR unchanged      Progression   Progression Continue to progress workloads to maintain intensity without signs/symptoms of physical distress.    Average METs 2.68      Resistance Training   Training Prescription Yes    Weight 2 lb    Reps 10-15      Interval Training   Interval Training No      Treadmill   MPH 2.2    Grade 0    Minutes 15    METs 2.69      Recumbant Bike   Level 2    Watts 19    Minutes 15    METs 3.33      NuStep   Level 3    Minutes 15    METs 2.1      T5 Nustep   Level 3    Minutes 15    METs 2      Biostep-RELP   Level 2    Minutes 15    METs 3       Oxygen   Maintain Oxygen Saturation 88% or higher          Nutrition:  Target Goals: Understanding of nutrition guidelines, daily intake of sodium 1500mg , cholesterol 200mg , calories 30% from fat and 7% or less from saturated fats, daily to have 5 or more servings of fruits and vegetables.  Education: Nutrition 1 -Group instruction provided by verbal, written material, interactive activities, discussions, models, and posters to present general guidelines for heart healthy nutrition including macronutrients, label reading, and promoting whole foods over processed counterparts. Education serves as pensions consultant of discussion of heart healthy eating for all. Written material provided at class time.    Education: Nutrition 2 -Group instruction provided by verbal, written material, interactive activities, discussions, models, and posters to present general guidelines for heart healthy nutrition including sodium, cholesterol, and saturated fat. Providing guidance of habit forming to improve blood pressure, cholesterol, and body weight. Written material provided at class time.     Biometrics:  Pre Biometrics - 03/29/24 1510       Pre Biometrics   Height 5' 2.6 (1.59 m)    Weight 135 lb 11.2 oz (61.6 kg)    Waist Circumference 32 inches    Hip Circumference 35.5 inches    Waist to Hip Ratio 0.9 %    BMI (Calculated) 24.35    Single Leg Stand 7 seconds           Nutrition Therapy Plan and Nutrition Goals:   Nutrition Assessments:  MEDIFICTS Score Key: >=70 Need to make dietary changes  40-70 Heart Healthy Diet <= 40 Therapeutic Level Cholesterol Diet   Picture Your Plate Scores: <59 Unhealthy dietary pattern with much room for improvement. 41-50 Dietary pattern unlikely to meet recommendations for good health and room for improvement. 51-60 More healthful dietary pattern, with some room for improvement.  >60 Healthy dietary pattern, although there may be some specific  behaviors that could be improved.    Nutrition Goals Re-Evaluation:  Nutrition Goals Re-Evaluation     Row Name 05/10/24 1106  Goals   Nutrition Goal Pt has deferred to meet with the RD at this time.          Nutrition Goals Discharge (Final Nutrition Goals Re-Evaluation):  Nutrition Goals Re-Evaluation - 05/10/24 1106       Goals   Nutrition Goal Pt has deferred to meet with the RD at this time.          Psychosocial: Target Goals: Acknowledge presence or absence of significant depression and/or stress, maximize coping skills, provide positive support system. Participant is able to verbalize types and ability to use techniques and skills needed for reducing stress and depression.   Education: Stress, Anxiety, and Depression - Group verbal and visual presentation to define topics covered.  Reviews how body is impacted by stress, anxiety, and depression.  Also discusses healthy ways to reduce stress and to treat/manage anxiety and depression. Written material provided at class time.   Education: Sleep Hygiene -Provides group verbal and written instruction about how sleep can affect your health.  Define sleep hygiene, discuss sleep cycles and impact of sleep habits. Review good sleep hygiene tips.   Initial Review & Psychosocial Screening:  Initial Psych Review & Screening - 03/23/24 1343       Initial Review   Current issues with None Identified      Family Dynamics   Good Support System? Yes      Barriers   Psychosocial barriers to participate in program There are no identifiable barriers or psychosocial needs.;The patient should benefit from training in stress management and relaxation.      Screening Interventions   Interventions Encouraged to exercise;Provide feedback about the scores to participant;To provide support and resources with identified psychosocial needs    Expected Outcomes Long Term Goal: Stressors or current issues are controlled or  eliminated.;Short Term goal: Utilizing psychosocial counselor, staff and physician to assist with identification of specific Stressors or current issues interfering with healing process. Setting desired goal for each stressor or current issue identified.;Short Term goal: Identification and review with participant of any Quality of Life or Depression concerns found by scoring the questionnaire.;Long Term goal: The participant improves quality of Life and PHQ9 Scores as seen by post scores and/or verbalization of changes          Quality of Life Scores:   Scores of 19 and below usually indicate a poorer quality of life in these areas.  A difference of  2-3 points is a clinically meaningful difference.  A difference of 2-3 points in the total score of the Quality of Life Index has been associated with significant improvement in overall quality of life, self-image, physical symptoms, and general health in studies assessing change in quality of life.  PHQ-9: Review Flowsheet       03/29/2024  Depression screen PHQ 2/9  Decreased Interest 0  Down, Depressed, Hopeless 0  PHQ - 2 Score 0  Altered sleeping 2  Tired, decreased energy 1  Change in appetite 0  Feeling bad or failure about yourself  0  Trouble concentrating 0  Moving slowly or fidgety/restless 0  Suicidal thoughts 0  PHQ-9 Score 3   Difficult doing work/chores Not difficult at all    Details       Data saved with a previous flowsheet row definition        Interpretation of Total Score  Total Score Depression Severity:  1-4 = Minimal depression, 5-9 = Mild depression, 10-14 = Moderate depression, 15-19 = Moderately severe depression, 20-27 =  Severe depression   Psychosocial Evaluation and Intervention:  Psychosocial Evaluation - 03/23/24 1352       Psychosocial Evaluation & Interventions   Interventions Encouraged to exercise with the program and follow exercise prescription    Comments Ms. 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 She states she feels like she has been recovering well besides being dizzy at times. They are working with her blood pressure medications to hopefully manage it better. She enjoys spending time with her horse, dog, and cat as well as staying active. She rides her recumbent bike 5 days a week. When she feels stressed, she takes some B1 and then focuses on doing positive things. She reports no current concerns    Expected Outcomes Short: attend cardiac rehab for education and exercise Long: develop and maitnain positive self care habits.    Continue Psychosocial Services  Follow up required by staff          Psychosocial Re-Evaluation:  Psychosocial Re-Evaluation     Row Name 05/10/24 1103             Psychosocial Re-Evaluation   Current issues with None Identified       Comments Evon reports no major stressors at this time. She states that when she deals with the daily stress of life she enjoys burning trash and sewing. She also states that exercise has been a good stress reliever for her. She is sleeping well at this time. She has a good support system made up by her husband, animals, neighbors and friends.       Expected Outcomes Short: Continue to relieve stress through exercise. Long: Maintain positive outlook.       Interventions Encouraged to attend Cardiac Rehabilitation for the exercise       Continue Psychosocial Services  Follow up required by staff          Psychosocial Discharge (Final Psychosocial Re-Evaluation):  Psychosocial Re-Evaluation - 05/10/24 1103       Psychosocial Re-Evaluation   Current issues with None Identified    Comments Noell reports no major stressors at this time. She states that when she deals with the daily stress of life she enjoys burning trash and sewing. She also states that exercise has been a good stress reliever for her. She is sleeping well at this time. She has a good support system made up by her  husband, animals, neighbors and friends.    Expected Outcomes Short: Continue to relieve stress through exercise. Long: Maintain positive outlook.    Interventions Encouraged to attend Cardiac Rehabilitation for the exercise    Continue Psychosocial Services  Follow up required by staff          Vocational Rehabilitation: Provide vocational rehab assistance to qualifying candidates.   Vocational Rehab Evaluation & Intervention:  Vocational Rehab - 03/23/24 1342       Initial Vocational Rehab Evaluation & Intervention   Assessment shows need for Vocational Rehabilitation No          Education: Education Goals: Education classes will be provided on a variety of topics geared toward better understanding of heart health and risk factor modification. Participant will state understanding/return demonstration of topics presented as noted by education test scores.  Learning Barriers/Preferences:  Learning Barriers/Preferences - 03/23/24 1342       Learning Barriers/Preferences   Learning Barriers None    Learning Preferences None          General Cardiac Education Topics:  AED/CPR: - Group verbal and written instruction with the use of models to demonstrate the basic use of the AED with the basic ABC's of resuscitation.   Test and Procedures: - Group verbal and visual presentation and models provide information about basic cardiac anatomy and function. Reviews the testing methods done to diagnose heart disease and the outcomes of the test results. Describes the treatment choices: Medical Management, Angioplasty, or Coronary Bypass Surgery for treating various heart conditions including Myocardial Infarction, Angina, Valve Disease, and Cardiac Arrhythmias. Written material provided at class time.   Medication Safety: - Group verbal and visual instruction to review commonly prescribed medications for heart and lung disease. Reviews the medication, class of the drug, and side  effects. Includes the steps to properly store meds and maintain the prescription regimen. Written material provided at class time.   Intimacy: - Group verbal instruction through game format to discuss how heart and lung disease can affect sexual intimacy. Written material provided at class time.   Know Your Numbers and Heart Failure: - Group verbal and visual instruction to discuss disease risk factors for cardiac and pulmonary disease and treatment options.  Reviews associated critical values for Overweight/Obesity, Hypertension, Cholesterol, and Diabetes.  Discusses basics of heart failure: signs/symptoms and treatments.  Introduces Heart Failure Zone chart for action plan for heart failure. Written material provided at class time.   Infection Prevention: - Provides verbal and written material to individual with discussion of infection control including proper hand washing and proper equipment cleaning during exercise session. Flowsheet Row Cardiac Rehab from 03/29/2024 in [facility] Cardiac and Pulmonary Rehab  Date 03/29/24  Educator MJC  Instruction Review Code 1- Verbalizes Understanding    Falls Prevention: - Provides verbal and written material to individual with discussion of falls prevention and safety. Flowsheet Row Cardiac Rehab from 03/29/2024 in [facility] Cardiac and Pulmonary Rehab  Date 03/29/24  Educator MJC  Instruction Review Code 1- Verbalizes Understanding    Other: -Provides group and verbal instruction on various topics (see comments)   Knowledge Questionnaire Score:   Core Components/Risk Factors/Patient Goals at Admission:  Personal Goals and Risk Factors at Admission - 03/23/24 1339       Core Components/Risk Factors/Patient Goals on Admission    Weight Management Weight Loss;Yes    Intervention Weight Management: Develop a combined nutrition and exercise program designed to reach desired caloric intake, while maintaining appropriate intake of nutrient and fiber,  sodium and fats, and appropriate energy expenditure required for the weight goal.;Weight Management: Provide education and appropriate resources to help participant work on and attain dietary goals.;Weight Management/Obesity: Establish reasonable short term and long term weight goals.    Goal Weight: Long Term 115 lb (52.2 kg)    Expected Outcomes Short Term: Continue to assess and modify interventions until short term weight is achieved;Long Term: Adherence to nutrition and physical activity/exercise program aimed toward attainment of established weight goal;Weight Loss: Understanding of general recommendations for a balanced deficit meal plan, which promotes 1-2 lb weight loss per week and includes a negative energy balance of [phone] kcal/d;Understanding recommendations for meals to include 15-35% energy as protein, 25-35% energy from fat, 35-60% energy from carbohydrates, less than 200mg  of dietary cholesterol, 20-35 gm of total fiber daily;Understanding of distribution of calorie intake throughout the day with the consumption of 4-5 meals/snacks    Hypertension Yes    Intervention Provide education on lifestyle modifcations including regular physical activity/exercise, weight management, moderate sodium restriction and increased consumption of fresh fruit,  vegetables, and low fat dairy, alcohol moderation, and smoking cessation.;Monitor prescription use compliance.    Expected Outcomes Long Term: Maintenance of blood pressure at goal levels.;Short Term: Continued assessment and intervention until BP is < 140/9mm HG in hypertensive participants. < 130/4mm HG in hypertensive participants with diabetes, heart failure or chronic kidney disease.    Lipids Yes    Intervention Provide education and support for participant on nutrition & aerobic/resistive exercise along with prescribed medications to achieve LDL 70mg , HDL >40mg .    Expected Outcomes Short Term: Participant states understanding of desired  cholesterol values and is compliant with medications prescribed. Participant is following exercise prescription and nutrition guidelines.;Long Term: Cholesterol controlled with medications as prescribed, with individualized exercise RX and with personalized nutrition plan. Value goals: LDL < 70mg , HDL > 40 mg.          Education:Diabetes - Individual verbal and written instruction to review signs/symptoms of diabetes, desired ranges of glucose level fasting, after meals and with exercise. Acknowledge that pre and post exercise glucose checks will be done for 3 sessions at entry of program.   Core Components/Risk Factors/Patient Goals Review:   Goals and Risk Factor Review     Row Name 05/10/24 1106             Core Components/Risk Factors/Patient Goals Review   Personal Goals Review Weight Management/Obesity;Hypertension       Review Jhene states that she is not currently working on weight loss. She is happy with where her weight is right now as she weighed in today at 130.8 lb. She states that her BP has been within normal ranges at most times. She states her BP was a little higher when she went for an infusion appointment, but it returned to normal quickly. She owns a BP cuff and checks her readings routinely. She continues to take all of her medications as prescribed.       Expected Outcomes Short: Continue to check BP at home routinely. Long: Continue to manage lifestyle risk factors.          Core Components/Risk Factors/Patient Goals at Discharge (Final Review):   Goals and Risk Factor Review - 05/10/24 1106       Core Components/Risk Factors/Patient Goals Review   Personal Goals Review Weight Management/Obesity;Hypertension    Review Breiana states that she is not currently working on weight loss. She is happy with where her weight is right now as she weighed in today at 130.8 lb. She states that her BP has been within normal ranges at most times. She states her BP was a  little higher when she went for an infusion appointment, but it returned to normal quickly. She owns a BP cuff and checks her readings routinely. She continues to take all of her medications as prescribed.    Expected Outcomes Short: Continue to check BP at home routinely. Long: Continue to manage lifestyle risk factors.          ITP Comments:  ITP Comments     Row Name 03/23/24 1356 03/29/24 1504 03/30/24 1153 04/05/24 1111 04/27/24 0956   ITP Comments Initial phone call completed. Diagnosis can be found in [facility] 8/26. EP Orientation scheduled for Tuesday 9/23 at 1:30. Completed and gym orientation for cardiac rehab. Initial ITP created and sent for review to Dr. Oneil Pinal, Medical Director. 30 Day review completed. Medical Director ITP review done; changes made as directed and signed approval by Medical Director. New to program. First full day  of exercise!  Patient was oriented to gym and equipment including functions, settings, policies, and procedures.  Patient's individual exercise prescription and treatment plan were reviewed.  All starting workloads were established based on the results of the 6 minute walk test done at initial orientation visit.  The plan for exercise progression was also introduced and progression will be customized based on patient's performance and goals. 30 Day review completed. Medical Director ITP review done, changes made as directed, and signed approval by Medical Director.    Row Name 05/25/24 0935           ITP Comments 30 Day review completed. Medical Director ITP review done, changes made as directed, and signed approval by Medical Director.          Comments: 30 day review ITP

## 2024-05-26 ENCOUNTER — Encounter: Admitting: Emergency Medicine

## 2024-05-26 DIAGNOSIS — Z952 Presence of prosthetic heart valve: Secondary | ICD-10-CM

## 2024-05-26 DIAGNOSIS — Z48812 Encounter for surgical aftercare following surgery on the circulatory system: Secondary | ICD-10-CM | POA: Diagnosis not present

## 2024-05-26 NOTE — Progress Notes (Signed)
 Daily Session Note  Patient Details  Name: Ima Hafner MRN: [id number] Date of Birth: [birth year] Referring Provider:   Flowsheet Row Cardiac Rehab from 03/29/2024 in [facility] Cardiac and Pulmonary Rehab  Referring Provider Dr. Newman Lawrence    Encounter Date: 05/26/2024  Check In:  Session Check In - 05/26/24 1119       Check-In   Supervising physician immediately available to respond to emergencies See telemetry face sheet for immediately available ER MD    Location [facility]-Cardiac & Pulmonary Rehab    Staff Present Leita Franks RN,BSN;Joseph [facility] BS, Exercise Physiologist;Margaret Best, MS, Exercise Physiologist;Jason Elnor RDN,LDN    Virtual Visit No    Medication changes reported     No    Fall or balance concerns reported    No    Tobacco Cessation No Change    Warm-up and Cool-down Performed on first and last piece of equipment    Resistance Training Performed Yes    VAD Patient? No    PAD/SET Patient? No      Pain Assessment   Currently in Pain? No/denies             Social History   Tobacco Use  Smoking Status Former   Current packs/day: 0.00   Types: Cigarettes   Quit date: 06/20/1989   Years since quitting: 34.9  Smokeless Tobacco Never  Tobacco Comments   smoked occasional cigarettes for 5 years    Goals Met:  Independence with exercise equipment Exercise tolerated well No report of concerns or symptoms today Strength training completed today  Goals Unmet:  Not Applicable  Comments: Pt able to follow exercise prescription today without complaint.  Will continue to monitor for progression.    Dr. Oneil Pinal is Medical Director for [facility] Cardiac Rehabilitation.  Dr. Fuad Aleskerov is Medical Director for [facility] Pulmonary Rehabilitation.

## 2024-05-31 ENCOUNTER — Encounter

## 2024-05-31 DIAGNOSIS — Z952 Presence of prosthetic heart valve: Secondary | ICD-10-CM

## 2024-05-31 DIAGNOSIS — Z48812 Encounter for surgical aftercare following surgery on the circulatory system: Secondary | ICD-10-CM | POA: Diagnosis not present

## 2024-05-31 NOTE — Progress Notes (Signed)
 Daily Session Note  Patient Details  Name: Hannah Willis MRN: [id number] Date of Birth: [birth year] Referring Provider:   Flowsheet Row Cardiac Rehab from 03/29/2024 in [facility] Cardiac and Pulmonary Rehab  Referring Provider Dr. Newman Lawrence    Encounter Date: 05/31/2024  Check In:  Session Check In - 05/31/24 1052       Check-In   Supervising physician immediately available to respond to emergencies See telemetry face sheet for immediately available ER MD    Location [facility]-Cardiac & Pulmonary Rehab    Staff Present Burnard Davenport RN,BSN,MPA;Maxon Conetta BS, Exercise Physiologist;Margaret Best, MS, Exercise Physiologist;Noah Tickle, BS, Exercise Physiologist;Jason Elnor RDN,LDN    Virtual Visit No    Medication changes reported     No    Fall or balance concerns reported    No    Tobacco Cessation No Change    Warm-up and Cool-down Performed on first and last piece of equipment    Resistance Training Performed Yes    VAD Patient? No    PAD/SET Patient? No      Pain Assessment   Currently in Pain? No/denies             Social History   Tobacco Use  Smoking Status Former   Current packs/day: 0.00   Types: Cigarettes   Quit date: 06/20/1989   Years since quitting: 34.9  Smokeless Tobacco Never  Tobacco Comments   smoked occasional cigarettes for 5 years    Goals Met:  Independence with exercise equipment Exercise tolerated well No report of concerns or symptoms today Strength training completed today  Goals Unmet:  Not Applicable  Comments: Pt able to follow exercise prescription today without complaint.  Will continue to monitor for progression.    Dr. Oneil Pinal is Medical Director for [facility] Cardiac Rehabilitation.  Dr. Fuad Aleskerov is Medical Director for [facility] Pulmonary Rehabilitation.

## 2024-06-01 ENCOUNTER — Encounter: Admitting: Emergency Medicine

## 2024-06-01 DIAGNOSIS — Z952 Presence of prosthetic heart valve: Secondary | ICD-10-CM

## 2024-06-01 DIAGNOSIS — Z48812 Encounter for surgical aftercare following surgery on the circulatory system: Secondary | ICD-10-CM | POA: Diagnosis not present

## 2024-06-01 NOTE — Progress Notes (Signed)
 Daily Session Note  Patient Details  Name: Hannah Willis MRN: [id number] Date of Birth: 1950-11-04 Referring Provider:   Flowsheet Row Cardiac Rehab from 03/29/2024 in [facility] Cardiac and Pulmonary Rehab  Referring Provider Dr. Newman Lawrence    Encounter Date: 06/01/2024  Check In:  Session Check In - 06/01/24 1109       Check-In   Supervising physician immediately available to respond to emergencies See telemetry face sheet for immediately available ER MD    Location [facility]-Cardiac & Pulmonary Rehab    Staff Present Maxon Burnell HECKLE, Exercise Physiologist;Undra Harriman RN,BSN;Meredith Tressa RN,BSN;Kelly Dyane BS, ACSM CEP, Exercise Physiologist    Virtual Visit No    Medication changes reported     No    Fall or balance concerns reported    No    Tobacco Cessation No Change    Warm-up and Cool-down Performed on first and last piece of equipment    Resistance Training Performed Yes    VAD Patient? No    PAD/SET Patient? No      Pain Assessment   Currently in Pain? No/denies             Social History   Tobacco Use  Smoking Status Former   Current packs/day: 0.00   Types: Cigarettes   Quit date: 06/20/1989   Years since quitting: 34.9  Smokeless Tobacco Never  Tobacco Comments   smoked occasional cigarettes for 5 years    Goals Met:  Independence with exercise equipment Exercise tolerated well No report of concerns or symptoms today Strength training completed today  Goals Unmet:  Not Applicable  Comments: Pt able to follow exercise prescription today without complaint.  Will continue to monitor for progression.    Dr. Oneil Pinal is Medical Director for [facility] Cardiac Rehabilitation.  Dr. Fuad Aleskerov is Medical Director for [facility] Pulmonary Rehabilitation.

## 2024-06-07 ENCOUNTER — Encounter

## 2024-06-07 DIAGNOSIS — Z952 Presence of prosthetic heart valve: Secondary | ICD-10-CM | POA: Diagnosis present

## 2024-06-07 NOTE — Progress Notes (Signed)
 Daily Session Note  Patient Details  Name: Hannah Willis MRN: [id number] Date of Birth: Dec 26, 1950 Referring Provider:   Flowsheet Row Cardiac Rehab from 03/29/2024 in [facility] Cardiac and Pulmonary Rehab  Referring Provider Dr. Newman Lawrence    Encounter Date: 06/07/2024  Check In:  Session Check In - 06/07/24 1051       Check-In   Supervising physician immediately available to respond to emergencies See telemetry face sheet for immediately available ER MD    Location [facility]-Cardiac & Pulmonary Rehab    Staff Present Burnard Davenport RN,BSN,MPA;Maxon Conetta BS, Exercise Physiologist;Margaret Best, MS, Exercise Physiologist;Noah Tickle, BS, Exercise Physiologist;Jason Elnor RDN,LDN    Virtual Visit No    Medication changes reported     No    Fall or balance concerns reported    No    Tobacco Cessation No Change    Warm-up and Cool-down Performed on first and last piece of equipment    Resistance Training Performed Yes    VAD Patient? No    PAD/SET Patient? No      Pain Assessment   Currently in Pain? No/denies             Social History   Tobacco Use  Smoking Status Former   Current packs/day: 0.00   Types: Cigarettes   Quit date: 06/20/1989   Years since quitting: 34.9  Smokeless Tobacco Never  Tobacco Comments   smoked occasional cigarettes for 5 years    Goals Met:  Proper associated with RPD/PD & O2 Sat Independence with exercise equipment Exercise tolerated well No report of concerns or symptoms today Strength training completed today  Goals Unmet:  Not Applicable  Comments: Pt able to follow exercise prescription today without complaint.  Will continue to monitor for progression.    Dr. Oneil Pinal is Medical Director for [facility] Cardiac Rehabilitation.  Dr. Fuad Aleskerov is Medical Director for [facility] Pulmonary Rehabilitation.

## 2024-06-09 ENCOUNTER — Encounter: Admitting: Emergency Medicine

## 2024-06-09 DIAGNOSIS — Z952 Presence of prosthetic heart valve: Secondary | ICD-10-CM

## 2024-06-09 NOTE — Progress Notes (Signed)
 Daily Session Note  Patient Details  Name: Hannah Willis MRN: [id number] Date of Birth: Aug 01, 1950 Referring Provider:   Flowsheet Row Cardiac Rehab from 03/29/2024 in [facility] Cardiac and Pulmonary Rehab  Referring Provider Dr. Newman Lawrence    Encounter Date: 06/09/2024  Check In:  Session Check In - 06/09/24 1114       Check-In   Supervising physician immediately available to respond to emergencies See telemetry face sheet for immediately available ER MD    Location [facility]-Cardiac & Pulmonary Rehab    Staff Present Leita Franks RN,BSN;Joseph Rolinda RCP,RRT,BSRT;Jason Elnor RDN,LDN;Laureen Delores, BS, RRT, CPFT    Virtual Visit No    Medication changes reported     No    Fall or balance concerns reported    No    Tobacco Cessation No Change    Warm-up and Cool-down Performed on first and last piece of equipment    Resistance Training Performed Yes    VAD Patient? No    PAD/SET Patient? No      Pain Assessment   Currently in Pain? No/denies             Social History   Tobacco Use  Smoking Status Former   Current packs/day: 0.00   Types: Cigarettes   Quit date: 06/20/1989   Years since quitting: 34.9  Smokeless Tobacco Never  Tobacco Comments   smoked occasional cigarettes for 5 years    Goals Met:  Independence with exercise equipment Exercise tolerated well No report of concerns or symptoms today Strength training completed today  Goals Unmet:  Not Applicable  Comments: Pt able to follow exercise prescription today without complaint.  Will continue to monitor for progression.    Dr. Oneil Pinal is Medical Director for [facility] Cardiac Rehabilitation.  Dr. Fuad Aleskerov is Medical Director for [facility] Pulmonary Rehabilitation.

## 2024-06-13 ENCOUNTER — Telehealth: Payer: Self-pay | Admitting: Cardiology

## 2024-06-13 NOTE — Telephone Encounter (Signed)
 Spoke with pt, she noticed the low blood pressure and dizziness in November but did not contact us . Her blood pressures have been running 98-92. She gets dizzy when up walking around, it comes and goes. She was encouraged to drink plenty of fluids, avoiding caffeine. Her losartan  is 50 mg and her metoprolol  is 100 mg both in the morning. Advised patient to change metoprolol  to the evening and see if that makes a differnece in her blood pressure numbers. She will let us  know if no change.

## 2024-06-13 NOTE — Telephone Encounter (Signed)
 Pt c/o BP issue: STAT if pt c/o blurred vision, one-sided weakness or slurred speech.  STAT if BP is GREATER than 180/120 TODAY.  STAT if BP is LESS than 90/60 and SYMPTOMATIC TODAY  1. What is your BP concern? Hypotension  2. Have you taken any BP medication today? yes   3. What are your last 5 BP readings? 125/82, 92/56, 94/58   4. Are you having any other symptoms (ex. Dizziness, headache, blurred vision, passed out)? Headaches, mild dizziness

## 2024-06-14 ENCOUNTER — Encounter

## 2024-06-14 DIAGNOSIS — Z952 Presence of prosthetic heart valve: Secondary | ICD-10-CM

## 2024-06-14 NOTE — Progress Notes (Signed)
 Daily Session Note  Patient Details  Name: Hannah Willis MRN: [id number] Date of Birth: 04-27-1951 Referring Provider:   Flowsheet Row Cardiac Rehab from 03/29/2024 in [facility] Cardiac and Pulmonary Rehab  Referring Provider Dr. Newman Lawrence    Encounter Date: 06/14/2024  Check In:  Session Check In - 06/14/24 1049       Check-In   Supervising physician immediately available to respond to emergencies See telemetry face sheet for immediately available ER MD    Location [facility]-Cardiac & Pulmonary Rehab    Staff Present Burnard Davenport RN,BSN,MPA;Meredith Tressa RN,BSN;Noah Tickle, BS, Exercise Physiologist    Virtual Visit No    Medication changes reported     No    Fall or balance concerns reported    No    Tobacco Cessation No Change    Warm-up and Cool-down Performed on first and last piece of equipment    Resistance Training Performed Yes    VAD Patient? No    PAD/SET Patient? No      Pain Assessment   Currently in Pain? No/denies             Social History   Tobacco Use  Smoking Status Former   Current packs/day: 0.00   Types: Cigarettes   Quit date: 06/20/1989   Years since quitting: 35.0  Smokeless Tobacco Never  Tobacco Comments   smoked occasional cigarettes for 5 years    Goals Met:  Proper associated with RPD/PD & O2 Sat Independence with exercise equipment Exercise tolerated well No report of concerns or symptoms today Strength training completed today  Goals Unmet:  Not Applicable  Comments: Pt able to follow exercise prescription today without complaint.  Will continue to monitor for progression.    Dr. Oneil Pinal is Medical Director for [facility] Cardiac Rehabilitation.  Dr. Fuad Aleskerov is Medical Director for [facility] Pulmonary Rehabilitation.

## 2024-06-15 ENCOUNTER — Telehealth: Payer: Self-pay | Admitting: Cardiology

## 2024-06-15 ENCOUNTER — Ambulatory Visit (HOSPITAL_COMMUNITY)
Admission: RE | Admit: 2024-06-15 | Discharge: 2024-06-15 | Attending: Physician Assistant | Admitting: Physician Assistant

## 2024-06-15 DIAGNOSIS — I7121 Aneurysm of the ascending aorta, without rupture: Secondary | ICD-10-CM | POA: Diagnosis not present

## 2024-06-15 DIAGNOSIS — I351 Nonrheumatic aortic (valve) insufficiency: Secondary | ICD-10-CM

## 2024-06-15 DIAGNOSIS — Q2543 Congenital aneurysm of aorta: Secondary | ICD-10-CM | POA: Diagnosis not present

## 2024-06-15 DIAGNOSIS — E782 Mixed hyperlipidemia: Secondary | ICD-10-CM | POA: Diagnosis present

## 2024-06-15 DIAGNOSIS — I5020 Unspecified systolic (congestive) heart failure: Secondary | ICD-10-CM | POA: Insufficient documentation

## 2024-06-15 LAB — ECHOCARDIOGRAM COMPLETE
AR max vel: 1.88 cm2
AV Area VTI: 1.54 cm2
AV Area mean vel: 1.52 cm2
AV Mean grad: 6 mmHg
AV Peak grad: 9.6 mmHg
Ao pk vel: 1.55 m/s
Area-P 1/2: 3.83 cm2
MV M vel: 4.13 m/s
MV Peak grad: 68.2 mmHg
S' Lateral: 3 cm

## 2024-06-15 NOTE — Telephone Encounter (Signed)
 Spoke to patient she stated during echo today her pulse was 30 after echo pulse 60.B/P 96/68.She felt ok.Stated during cardiac rehab B/P has been low 92/67,98/68,90/62.Pulse ranging 65.She is taking Losartan  50 mg in am and taking Metoprolol  100 mg in pm.Stated at present B/P 124/85 pulse 70.She feels ok.She is concerned about B/P and pulse low earlier.Advised I will send message to Dr.Patwardhan for advice.

## 2024-06-15 NOTE — Telephone Encounter (Signed)
 Pt c/o BP issue: STAT if pt c/o blurred vision, one-sided weakness or slurred speech.  STAT if BP is GREATER than 180/120 TODAY.  STAT if BP is LESS than 90/60 and SYMPTOMATIC TODAY  1. What is your BP concern?   See below  2. Have you taken any BP medication today?  Yes  3. What are your last 5 BP readings?  92/67 98/68  4. Are you having any other symptoms (ex. Dizziness, headache, blurred vision, passed out)?    Headache, minimal dizziness  Patient is concerned her BP readings have been trending low.  Patient noted she has rehab therapy tomorrow (12/11) between 10:30 am and 1:00 pm.

## 2024-06-15 NOTE — Telephone Encounter (Signed)
 We can do an EKG in the office tomorrow and place on 2 week monitor. Alternatively, I could see her on Monday 12/15. N the meanntime, if she has any severe symptoms of dizziness recommend calling 911.  Thanks MJP

## 2024-06-15 NOTE — Telephone Encounter (Signed)
 Spoke to patient Dr.Patwardhan's advice given.Appointment scheduled with him on Mon 12/15 at 9:30 am.She will monitor B/P and pulse daily and bring readings to appointment.Advised if symptoms worsen she will go to ED.

## 2024-06-16 ENCOUNTER — Encounter

## 2024-06-16 NOTE — Progress Notes (Signed)
 Patient has reviewed results via MyChart.   Last read by Jerelene Sable [place] at 4:12PM on 06/16/2024.

## 2024-06-20 ENCOUNTER — Encounter: Payer: Self-pay | Admitting: Cardiology

## 2024-06-20 ENCOUNTER — Ambulatory Visit

## 2024-06-20 ENCOUNTER — Ambulatory Visit: Attending: Cardiology | Admitting: Cardiology

## 2024-06-20 VITALS — BP 94/54 | HR 60 | Ht 62.0 in | Wt 132.0 lb

## 2024-06-20 DIAGNOSIS — I5020 Unspecified systolic (congestive) heart failure: Secondary | ICD-10-CM

## 2024-06-20 DIAGNOSIS — E782 Mixed hyperlipidemia: Secondary | ICD-10-CM

## 2024-06-20 DIAGNOSIS — R001 Bradycardia, unspecified: Secondary | ICD-10-CM

## 2024-06-20 DIAGNOSIS — I493 Ventricular premature depolarization: Secondary | ICD-10-CM | POA: Insufficient documentation

## 2024-06-20 DIAGNOSIS — I480 Paroxysmal atrial fibrillation: Secondary | ICD-10-CM | POA: Insufficient documentation

## 2024-06-20 DIAGNOSIS — R0609 Other forms of dyspnea: Secondary | ICD-10-CM | POA: Insufficient documentation

## 2024-06-20 DIAGNOSIS — I10 Essential (primary) hypertension: Secondary | ICD-10-CM

## 2024-06-20 MED ORDER — METOPROLOL SUCCINATE ER 100 MG PO TB24: 50.0000 mg | ORAL_TABLET | Freq: Every day | ORAL | Status: DC

## 2024-06-20 NOTE — Progress Notes (Unsigned)
 Applied a 14 day Zio XT monitor to patient in the office ?

## 2024-06-20 NOTE — Patient Instructions (Signed)
 Medication Instructions:  DECREASE Metoprolol  to 50 mg daily   *If you need a refill on your cardiac medications before your next appointment, please call your pharmacy*  Lab Work: Probnp  If you have labs (blood work) drawn today and your tests are completely normal, you will receive your results only by: MyChart Message (if you have MyChart) OR A paper copy in the mail If you have any lab test that is abnormal or we need to change your treatment, we will call you to review the results.  Testing/Procedures: 2 WEEK ZIO   Your physician has requested that you wear a Zio heart monitor for __14___ days. This will be mailed to your home with instructions on how to apply the monitor and how to return it when finished. Please allow 2 weeks after returning the heart monitor before our office calls you with the results.   Follow-Up: At [facility], you and your health needs are our priority.  As part of our continuing mission to provide you with exceptional heart care, our providers are all part of one team.  This team includes your primary Cardiologist (physician) and Advanced Practice Providers or APPs (Physician Assistants and Nurse Practitioners) who all work together to provide you with the care you need, when you need it.  Your next appointment:   Keep scheduled appointment   Provider:   Newman JINNY Lawrence, MD

## 2024-06-20 NOTE — Progress Notes (Signed)
 Cardiology Office Note:  .   Date:  06/20/2024  ID:  Hannah Willis, DOB [birth year], MRN [id number] PCP: Claudene Pellet, MD  [facility] Providers Cardiologist:  Newman Lawrence, MD PCP: Claudene Pellet, MD  Chief Complaint  Patient presents with   Bradycardia     Hannah Willis is a 73 y.o. 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  Kardia mobile app suggested possibility of A-fib at times.  Patient feels sluggish and tired, sometimes lightheaded with exertional dyspnea.   Vitals:   06/20/24 0937  BP: (!) 94/54  Pulse: 60  SpO2: 96%         Review of Systems  Cardiovascular:  Positive for dyspnea on exertion. Negative for chest pain, leg swelling, palpitations and syncope.       Slow pulse  Neurological:  Positive for light-headedness.        Studies Reviewed: SABRA        EKG 06/20/2024: Sinus rhythm with frequent Premature ventricular complexes Nonspecific ST and T wave abnormality When compared with ECG of 21-Apr-2024 11:40, ST no longer depressed in Lateral leads T wave inversion no longer evident in Inferior leads Nonspecific T wave abnormality has replaced inverted T waves in Anterolateral leads     Echocardiogram 06/2024:  1. Left ventricular ejection fraction, by estimation, is 55 to 60%. The  left ventricle has normal function. The left ventricle has no regional  wall motion abnormalities. Left ventricular diastolic parameters are  consistent with Grade II diastolic  dysfunction (pseudonormalization).   2. Right ventricular  systolic function is normal. The right ventricular  size is normal. There is normal pulmonary artery systolic pressure.   3. 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 No evidence of mitral stenosis. 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   4. The aortic valve has been repaired/replaced. Aortic valve  regurgitation is not visualized. No aortic stenosis is present. There is a  25 mm Edwards Konnect Resilia valve present in the aortic position.  Procedure Date: 02/08/2024.   5. Aortic root/ascending aorta has been repaired/replaced.   6. The inferior vena cava is normal in size with greater than 50%  respiratory variability, suggesting right atrial pressure of 3 mmHg.   Comparison(s): LVEF improved from prior.   Intraoperative TEE 02/2020: POST-OP IMPRESSIONS  _ Left Ventricle: has low normal systolic function, estimated at 45-50%.  _ Right Ventricle: normal function.  _ Aorta: A graft was placed in the ascending aorta for repair.  _ Left Atrial Appendage: The left atrial appendage appears unchanged from pre-bypass.  _ Aortic Valve: Status post bioprosthetic aortic valve replacement, with  Madeline function of the prosthesis. No paravalvular or intravalvular leak  visualized. Trans-aortic valvular mean gradient .  _ Mitral Valve: The mitral valve appears unchanged from pre-bypass. There is moderate regurgitation.  _ Tricuspid Valve: The tricuspid valve appears unchanged from pre-bypass. There is mild regurgitation.  _ Pulmonic Valve: The pulmonic valve appears unchanged from pre-bypass.  _ Interatrial Septum: The interatrial septum appears unchanged from  pre-bypass.  _ Pericardium:  The pericardium appears unchanged from pre-bypass.  _ Comments: No significant pleural or pericardial effusion.   CTA aorta 01/2024: 1. Aneurysmal dilation of the aortic root measuring up to 5.2 cm  at the sinuses of Valsalva. 2. No effacement of the sino-tubular junction and mildly ectatic but nonaneurysmal ascending aorta with normal transverse and descending thoracic aorta diameters. 3. Aortic and coronary artery atherosclerotic vascular calcifications. 4. Four vessel arch anatomy. The left vertebral artery arises directly from the aorta. 5. Cardiomegaly with prominent left atrial enlargement and mild left ventricular dilatation. 6. Nonobstructing left lower pole nephrolithiasis. 7. Multilevel degenerative disc disease.    Coronary angiography 12/25/2023: LM: Normal LAD: Prox 30% , distal 40% disease Ramus: Small, no significant disease Lcx: No significant disease RCA: Large, dominant          RPLA focal 40% disease   LVEDP 30 mmHg   Right heart catheterization 12/25/2023: RA: 7 mmHg RV: 27/3 mmHg PA: 30/15 mmHg, mPAP 16 mmHg PCW: 12 mmHg   AO sats: 97% PA sats: 69%   CO: 3.9 L/min CI: 2.3 L/min/m2   Mild nonobstructive coronary artery disease Elevated LVEDP in the setting of severe aortic regurgitation and reduced LVEF Refer to cardiothoracic surgery for consideration for aortic valve and possible aortic root replacement    TEE 12/2023: 1. AI is central with eccentric course; likely due to dilated aortic  root.   2. Left ventricular ejection fraction, by estimation, is 45 to 50%. The  left ventricle has mildly decreased function. The left ventricle  demonstrates global hypokinesis. The left ventricular internal cavity size  was moderately dilated.   3. Right ventricular systolic function is normal. The right ventricular  size is normal.   4. Left atrial size was severely dilated. No left atrial/left atrial  appendage thrombus was detected.   5. 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 There is moderate holosystolic prolapse of the middle  segment of the anterior leaflet of the mitral valve.   6. The aortic valve is  tricuspid. Aortic valve regurgitation is severe.  Aortic valve sclerosis is present, with no evidence of aortic valve  stenosis.   7. Aortic dilatation noted. There is moderate dilatation of the aortic  root, measuring 47 mm. There is Moderate (Grade III) plaque involving the  descending aorta.   8. 3D performed of the mitral valve and 3D performed of the aortic valve.   Echocardiogram 12/07/2023:  1. Left ventricular ejection fraction, by estimation, is 45 to 50%. The  left ventricle has mildly decreased function. The left ventricle  demonstrates global hypokinesis. The left ventricular internal cavity size  was mildly dilated. There is mild eccentric left ventricular hypertrophy.  Left ventricular diastolic parameters are consistent with Grade II diastolic dysfunction  (pseudonormalization). Elevated left atrial pressure.   2. Right ventricular systolic function is normal. The right ventricular  size is normal. There is normal pulmonary artery systolic pressure. The  estimated right ventricular systolic pressure is 35.3 mmHg.   3. Left atrial size was severely dilated.   4. 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 No evidence of mitral stenosis. There is severe  holosystolic prolapse of the middle segment of the anterior leaflet of the  mitral valve.   5. The aortic valve is tricuspid. Aortic valve regurgitation is moderate  to severe. No aortic stenosis is present. Aortic regurgitation PHT  measures 276 msec.   6. Aortic dilatation noted. There is  moderate dilatation of the aortic  root, measuring 45 mm. There is mild dilatation of the ascending aorta,  measuring 41 mm.   7. The inferior vena cava is normal in size with greater than 50%  respiratory variability, suggesting right atrial pressure of 3 mmHg.   Comparison(s): Prior images unable to be directly viewed, comparison made  by report only. Aortic insufficiency is a new dominant valvular   abnormality and the left ventricle has dilated and has reduced systolic  fumnction. 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   Labs 04/2024: Chol 165, TG 86, HDL 74, LDL 75 Hb 12 Cr 0.62 ProBNP 621   Labs 03/2024: Cr 0.73  Labs 02/2024: Hb 12.0. Cr 1.01, Na 133  11/2023: Chol 126, TG 49, HDL 65, LDL 50 HbA1C 5% Hb 13.5, WBC 19k Cr 0.55, T.bili 1.8 TSH 0.8   Physical Exam Vitals and nursing note reviewed.  Constitutional:      General: She is not in acute distress. Neck:     Vascular: No JVD.  Cardiovascular:     Rate and Rhythm: Normal rate. Rhythm irregular. Frequent Extrasystoles are present.    Heart sounds: Murmur heard.     High-pitched blowing holosystolic murmur is present with a grade of 2/6 at the apex.  Pulmonary:     Effort: Pulmonary effort is normal.     Breath sounds: Normal breath sounds. No wheezing or rales.  Musculoskeletal:     Right lower leg: No edema.     Left lower leg: No edema.      VISIT DIAGNOSES:   ICD-10-CM   1. Heart failure with improved ejection fraction (HFimpEF) (HCC)  I50.20 EKG 12-Lead    2. PAF (paroxysmal atrial fibrillation) (HCC)  I48.0 LONG TERM MONITOR (3-14 DAYS)    3. Bradycardia  R00.1 LONG TERM MONITOR (3-14 DAYS)    4. PVC (premature ventricular contraction)  I49.3     5. DOE (dyspnea on exertion)  R06.09 Pro b natriuretic peptide (BNP)    6. Mixed hyperlipidemia  E78.2 EKG 12-Lead    7. Essential hypertension  I10 EKG 12-Lead           Hannah Willis is a 73 y.o. 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   Recent episodes of low heart rate as well as possible A-fib, I suggested by [employer] mobile.  EKG also shows PVCs.  I suspect she may be having both frequent PVCs, as well as paroxysmal A-fib.  Recommend 2-week Zio monitor to assess A-fib burden.  Given her overall sluggishness and lightheadedness, along with low blood pressure, reduced metoprolol  succinate from 100 mg daily to 50 mg daily.  Continue Eliquis  5 mg twice daily.  Aortic regurgitation, aortic root aneurysm: Now s/p Biological Bentall Procedure using a 25 mm Edwards KONECT RESILIA pericardial valved conduit. Reimplantation of left and right coronary arteries (02/2024). EF remained mildly reduced on echocardiogram in 03/2024. Continue Jardiance  10 mg daily. If blood pressure remains low, we will could discontinue Jardiance , especially given that her EF is now normalized.  Mixed hyperlipidemia: Continue Crestor  5 mg daily.  Check lipid panel in 1 week.  F/u in 3 months  Signed, Newman JINNY Lawrence, MD

## 2024-06-21 ENCOUNTER — Ambulatory Visit: Payer: Self-pay | Admitting: Cardiology

## 2024-06-21 ENCOUNTER — Encounter

## 2024-06-21 DIAGNOSIS — Z952 Presence of prosthetic heart valve: Secondary | ICD-10-CM

## 2024-06-21 LAB — PRO B NATRIURETIC PEPTIDE: NT-Pro BNP: 602 pg/mL — ABNORMAL HIGH (ref 0–301)

## 2024-06-21 NOTE — Progress Notes (Signed)
 Daily Session Note  Patient Details  Name: Hannah Willis MRN: [id number] Date of Birth: [birth year] Referring Provider:   Flowsheet Row Cardiac Rehab from 03/29/2024 in [facility] Cardiac and Pulmonary Rehab  Referring Provider Dr. Newman Lawrence    Encounter Date: 06/21/2024  Check In:  Session Check In - 06/21/24 1055       Check-In   Supervising physician immediately available to respond to emergencies See telemetry face sheet for immediately available ER MD    Location [facility]-Cardiac & Pulmonary Rehab    Staff Present Burnard Davenport RN,BSN,MPA;Maxon Burnell BS, Exercise Physiologist;Meredith Tressa RN,BSN;Margaret Best, MS, Exercise Physiologist;Jason Elnor RDN,LDN    Virtual Visit No    Medication changes reported     Yes    Comments metoprolol  50 mg and heart monitor    Fall or balance concerns reported    No    Tobacco Cessation No Change    Warm-up and Cool-down Performed on first and last piece of equipment    Resistance Training Performed Yes    VAD Patient? No    PAD/SET Patient? No      Pain Assessment   Currently in Pain? No/denies             Tobacco Use History[1]  Goals Met:  Proper associated with RPD/PD & O2 Sat Independence with exercise equipment Using PLB without cueing & demonstrates good technique Exercise tolerated well No report of concerns or symptoms today Strength training completed today  Goals Unmet:  Not Applicable  Comments: Pt able to follow exercise prescription today without complaint.  Will continue to monitor for progression.    Dr. Oneil Pinal is Medical Director for [facility] Cardiac Rehabilitation.  Dr. Fuad Aleskerov is Medical Director for [facility] Pulmonary Rehabilitation.     [1]  Social History Tobacco Use  Smoking Status Former   Current packs/day: 0.00   Types: Cigarettes   Quit date: 06/20/1989   Years since quitting: 35.0  Smokeless Tobacco Never  Tobacco Comments   smoked occasional cigarettes  for 5 years

## 2024-06-22 ENCOUNTER — Encounter: Payer: Self-pay | Admitting: *Deleted

## 2024-06-22 DIAGNOSIS — Z952 Presence of prosthetic heart valve: Secondary | ICD-10-CM

## 2024-06-22 NOTE — Progress Notes (Signed)
 Cardiac Individual Treatment Plan  Patient Details  Name: Hannah Willis MRN: [id number] Date of Birth: 07/26/1950 Referring Provider:   Flowsheet Row Cardiac Rehab from 03/29/2024 in [facility] Cardiac and Pulmonary Rehab  Referring Provider Dr. Newman Lawrence    Initial Encounter Date:  Flowsheet Row Cardiac Rehab from 03/29/2024 in [facility] Cardiac and Pulmonary Rehab  Date 03/29/24    Visit Diagnosis: S/P aortic valve replacement  Patient's Home Medications on Admission: Current Medications[1]  Past Medical History: Past Medical History:  Diagnosis Date   Allergy    Apnea, sleep    no c-pap machine   Arthritis    OA   Basal cell carcinoma    on front chest   Blood in stool    LAST 2 MONTHS, MEDS CHNAGED FOR   GERD (gastroesophageal reflux disease)    Heart murmur    HFrEF (heart failure with reduced ejection fraction) (HCC) 01/20/2024   Hyperlipidemia    Hypertension    Kidney stones    Laryngitis    started monday night   Neuromuscular disorder (HCC)    Post-operative nausea and vomiting    Severe aortic insufficiency    Severe aortic regurgitation 12/21/2023   Spherocytosis (familial)    Stroke (HCC) 09/21/2007   Synovitis of right hand 12/29/2015   and wrist   Thoracic aortic aneurysm    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   Ulcerative colitis (HCC)    Weakness of right hand    due to stroke    Tobacco Use: Tobacco Use History[2]  Labs: Review Flowsheet  More data exists      Latest Ref Rng & Units 04/04/2015 12/25/2023 02/05/2024 02/08/2024 04/29/2024  Labs for ITP Cardiac and Pulmonary Rehab  Cholestrol 100 - 199 mg/dL 798  - - - 834   LDL (calc) 0 - 99 mg/dL 885  - - - 75   HDL-C >60 mg/dL 69  - - - 74   Trlycerides 0 - 149 mg/dL 89  - - - 86   Hemoglobin A1c 4.8 - 5.6 % 5.4  - 5.4  - -  PH, Arterial 7.35 - 7.45 - 7.432  - 7.375  7.364  7.381  7.433  7.467  7.484  -  PCO2 arterial 32 - 48 mmHg - 33.0  - 36.8  40.3  35.7  33.3  32.2  28.5  -   Bicarbonate 20.0 - 28.0 mmol/L - 23.7  23.9  22.0  - 21.4  22.8  21.6  22.3  23.3  23.3  21.4  -  TCO2 22 - 32 mmol/L - 25  25  23   - 23  24  23  23  26  27  24  26  26  27  24  22  22  22   -  Acid-base deficit 0.0 - 2.0 mmol/L - 1.0  1.0  2.0  - 3.0  2.0  4.0  2.0  1.0  2.0  -  O2 Saturation % - 66  72  97  - 97  97  97  100  100  72  100  -    Details       Multiple values from one day are sorted in reverse-chronological order          Exercise Target Goals: Exercise Program Goal: Individual exercise prescription set using results from initial 6 min walk test and THRR while considering  patients activity barriers and safety.   Exercise Prescription Goal: Initial exercise prescription  builds to 30-45 minutes a day of aerobic activity, 2-3 days per week.  Home exercise guidelines will be given to patient during program as part of exercise prescription that the participant will acknowledge.   Education: Aerobic Exercise: - Group verbal and visual presentation on the components of exercise prescription. Introduces F.I.T.T principle from ACSM for exercise prescriptions.  Reviews F.I.T.T. principles of aerobic exercise including progression. Written material provided at class time.   Education: Resistance Exercise: - Group verbal and visual presentation on the components of exercise prescription. Introduces F.I.T.T principle from ACSM for exercise prescriptions  Reviews F.I.T.T. principles of resistance exercise including progression. Written material provided at class time.    Education: Exercise & Equipment Safety: - Individual verbal instruction and demonstration of equipment use and safety with use of the equipment. Flowsheet Row Cardiac Rehab from 03/29/2024 in [facility] Cardiac and Pulmonary Rehab  Date 03/29/24  Educator MJC  Instruction Review Code 1- Verbalizes Understanding    Education: Exercise Physiology & General Exercise Guidelines: - Group verbal and written  instruction with models to review the exercise physiology of the cardiovascular system and associated critical values. Provides general exercise guidelines with specific guidelines to those with heart or lung disease. Written material provided at class time.   Education: Flexibility, Balance, Mind/Body Relaxation: - Group verbal and visual presentation with interactive activity on the components of exercise prescription. Introduces F.I.T.T principle from ACSM for exercise prescriptions. Reviews F.I.T.T. principles of flexibility and balance exercise training including progression. Also discusses the mind body connection.  Reviews various relaxation techniques to help reduce and manage stress (i.e. Deep breathing, progressive muscle relaxation, and visualization). Balance handout provided to take home. Written material provided at class time.   Activity Barriers & Risk Stratification:  Activity Barriers & Cardiac Risk Stratification - 03/29/24 1506       Activity Barriers & Cardiac Risk Stratification   Activity Barriers Left Knee Replacement;Right Knee Replacement;Joint Problems    Cardiac Risk Stratification Moderate          6 Minute Walk:  6 Minute Walk     Row Name 03/29/24 1504         6 Minute Walk   Phase Initial     Distance 1135 feet     Walk Time 6 minutes     # of Rest Breaks 0     MPH 2.14     METS 2.46     RPE 11     Perceived Dyspnea  1     VO2 Peak 8.6     Symptoms No     Resting HR 60 bpm     Resting BP 126/68     Resting Oxygen Saturation  90 %     Exercise Oxygen Saturation  during 6 min walk 90 %     Max Ex. HR 83 bpm     Max Ex. BP 148/64     2 Minute Post BP 124/62        Oxygen Initial Assessment:   Oxygen Re-Evaluation:   Oxygen Discharge (Final Oxygen Re-Evaluation):   Initial Exercise Prescription:  Initial Exercise Prescription - 03/29/24 1500       Date of Initial Exercise RX and Referring Provider   Date 03/29/24    Referring  Provider Dr. Newman Lawrence      Oxygen   Maintain Oxygen Saturation 88% or higher      Treadmill   MPH 2.2    Grade 0    Minutes 15  METs 2.68      Recumbant Bike   Level 2    RPM 50    Watts 25    Minutes 15    METs 2.46      NuStep   Level 2    SPM 80    Minutes 15    METs 2.46      T5 Nustep   Level 2    SPM 80    Minutes 15    METs 2.46      Biostep-RELP   Level 2    SPM 50    Minutes 15    METs 2.46      Track   Laps 30    Minutes 15    METs 2.63      Prescription Details   Duration Progress to 30 minutes of continuous aerobic without signs/symptoms of physical distress      Intensity   THRR 40-80% of Max Heartrate 94129    Ratings of Perceived Exertion 11-13    Perceived Dyspnea 0-4      Progression   Progression Continue to progress workloads to maintain intensity without signs/symptoms of physical distress.      Resistance Training   Training Prescription Yes    Weight 2lb    Reps 10-15          Perform Capillary Blood Glucose checks as needed.  Exercise Prescription Changes:   Exercise Prescription Changes     Row Name 03/29/24 1500 04/13/24 1100 04/29/24 0700 05/12/24 1600 05/24/24 1500     Response to Exercise   Blood Pressure (Admit) 126/68 104/62 118/64 134/78 118/66   Blood Pressure (Exercise) 148/64 130/62 128/70 142/60 138/70   Blood Pressure (Exit) 124/62 106/58 118/62 114/60 98/60   Heart Rate (Admit) 60 bpm 58 bpm 70 bpm 63 bpm 70 bpm   Heart Rate (Exercise) 83 bpm 85 bpm 112 bpm 107 bpm 93 bpm   Heart Rate (Exit) 59 bpm 61 bpm 76 bpm 59 bpm 68 bpm   Oxygen Saturation (Admit) 90 % -- -- -- --   Oxygen Saturation (Exercise) 90 % -- -- -- --   Oxygen Saturation (Exit) 90 % -- -- -- --   Rating of Perceived Exertion (Exercise) 11 13 13 14 13    Perceived Dyspnea (Exercise) 1 -- -- -- --   Symptoms none none none none none   Comments results first 2 weeks of exercise -- -- --   Duration -- Progress to 30  minutes of  aerobic without signs/symptoms of physical distress Continue with 30 min of aerobic exercise without signs/symptoms of physical distress. Continue with 30 min of aerobic exercise without signs/symptoms of physical distress. Continue with 30 min of aerobic exercise without signs/symptoms of physical distress.   Intensity -- THRR unchanged THRR unchanged THRR unchanged THRR unchanged     Progression   Progression -- Continue to progress workloads to maintain intensity without signs/symptoms of physical distress. Continue to progress workloads to maintain intensity without signs/symptoms of physical distress. Continue to progress workloads to maintain intensity without signs/symptoms of physical distress. Continue to progress workloads to maintain intensity without signs/symptoms of physical distress.   Average METs -- 2.79 2.73 2.8 2.68     Resistance Training   Training Prescription -- Yes Yes Yes Yes   Weight -- 2lb 2 lb 2 lb 2 lb   Reps -- 10-15 10-15 10-15 10-15     Interval Training   Interval Training -- No No No  No     Treadmill   MPH -- 2.3 2.4 2.4 2.2   Grade -- 0 0 0 0   Minutes -- 15 15 15 15    METs -- 2.3 2.84 2.84 2.69     Recumbant Bike   Level -- -- -- 2 2   Watts -- -- -- 25 19   Minutes -- -- -- 15 15   METs -- -- -- 3.31 3.33     NuStep   Level -- 3 5  T6: level 3 4 3    Minutes -- 15 15 15 15    METs -- 2.9 3.3  T6: 2.2 METs 3 2.1     T5 Nustep   Level -- -- -- -- 3   Minutes -- -- -- -- 15   METs -- -- -- -- 2     Biostep-RELP   Level -- -- -- 3 2   Minutes -- -- -- 15 15   METs -- -- -- 2 3     Oxygen   Maintain Oxygen Saturation -- 88% or higher 88% or higher 88% or higher 88% or higher    Row Name 06/07/24 1500 06/21/24 1400           Response to Exercise   Blood Pressure (Admit) 112/70 96/68      Blood Pressure (Exit) 114/62 102/62      Heart Rate (Admit) 59 bpm 63 bpm      Heart Rate (Exercise) 83 bpm 78 bpm      Heart Rate  (Exit) 67 bpm 72 bpm      Rating of Perceived Exertion (Exercise) 13 13      Symptoms none none      Duration Continue with 30 min of aerobic exercise without signs/symptoms of physical distress. Continue with 30 min of aerobic exercise without signs/symptoms of physical distress.      Intensity THRR unchanged THRR unchanged        Progression   Progression Continue to progress workloads to maintain intensity without signs/symptoms of physical distress. Continue to progress workloads to maintain intensity without signs/symptoms of physical distress.      Average METs 2.81 2.7        Resistance Training   Training Prescription Yes --      Weight 2 lb 2 lb      Reps 10-15 10-15        Interval Training   Interval Training No No        Treadmill   MPH 2.2 2.2      Grade 1 0      Minutes 15 15      METs 2.99 2.69        Recumbant Bike   Level 3 --      Watts 29 --      Minutes 15 --      METs 3.53 --        T5 Nustep   Level 2 3      Minutes 15 15      METs 2.4 2.1        Biostep-RELP   Level 3 2      Minutes 15 15      METs 2 3        Oxygen   Maintain Oxygen Saturation 88% or higher 88% or higher         Exercise Comments:   Exercise Comments     Row Name 04/05/24 [phone]  Exercise Comments First full day of exercise!  Patient was oriented to gym and equipment including functions, settings, policies, and procedures.  Patient's individual exercise prescription and treatment plan were reviewed.  All starting workloads were established based on the results of the 6 minute walk test done at initial orientation visit.  The plan for exercise progression was also introduced and progression will be customized based on patient's performance and goals.          Exercise Goals and Review:   Exercise Goals     Row Name 03/29/24 1510             Exercise Goals   Increase Physical Activity Yes       Intervention Provide advice, education, support and  counseling about physical activity/exercise needs.;Develop an individualized exercise prescription for aerobic and resistive training based on initial evaluation findings, risk stratification, comorbidities and participant's personal goals.       Expected Outcomes Long Term: Exercising regularly at least 3-5 days a week.;Long Term: Add in home exercise to make exercise part of routine and to increase amount of physical activity.;Short Term: Attend rehab on a regular basis to increase amount of physical activity.       Increase Strength and Stamina Yes       Intervention Provide advice, education, support and counseling about physical activity/exercise needs.;Develop an individualized exercise prescription for aerobic and resistive training based on initial evaluation findings, risk stratification, comorbidities and participant's personal goals.       Expected Outcomes Short Term: Increase workloads from initial exercise prescription for resistance, speed, and METs.;Short Term: Perform resistance training exercises routinely during rehab and add in resistance training at home;Long Term: Improve cardiorespiratory fitness, muscular endurance and strength as measured by increased METs and functional capacity ( )       Able to understand and use rate of perceived exertion (RPE) scale Yes       Intervention Provide education and explanation on how to use RPE scale       Expected Outcomes Long Term:  Able to use RPE to guide intensity level when exercising independently;Short Term: Able to use RPE daily in rehab to express subjective intensity level       Able to understand and use Dyspnea scale Yes       Intervention Provide education and explanation on how to use Dyspnea scale       Expected Outcomes Short Term: Able to use Dyspnea scale daily in rehab to express subjective sense of shortness of breath during exertion;Long Term: Able to use Dyspnea scale to guide intensity level when exercising independently        Knowledge and understanding of Target Heart Rate Range (THRR) Yes       Intervention Provide education and explanation of THRR including how the numbers were predicted and where they are located for reference       Expected Outcomes Short Term: Able to state/look up THRR;Short Term: Able to use daily as guideline for intensity in rehab;Long Term: Able to use THRR to govern intensity when exercising independently       Able to check pulse independently Yes       Intervention Provide education and demonstration on how to check pulse in carotid and radial arteries.;Review the importance of being able to check your own pulse for safety during independent exercise       Expected Outcomes Short Term: Able to explain why pulse checking is important during independent exercise;Long Term: Able  to check pulse independently and accurately       Understanding of Exercise Prescription Yes       Intervention Provide education, explanation, and written materials on patient's individual exercise prescription       Expected Outcomes Short Term: Able to explain program exercise prescription;Long Term: Able to explain home exercise prescription to exercise independently          Exercise Goals Re-Evaluation :  Exercise Goals Re-Evaluation     Row Name 04/05/24 1111 04/13/24 1143 04/29/24 0750 05/12/24 1636 05/24/24 1508     Exercise Goal Re-Evaluation   Exercise Goals Review Increase Physical Activity;Able to understand and use rate of perceived exertion (RPE) scale;Knowledge and understanding of Target Heart Rate Range (THRR);Understanding of Exercise Prescription;Increase Strength and Stamina;Able to understand and use Dyspnea scale;Able to check pulse independently Increase Physical Activity;Increase Strength and Stamina;Understanding of Exercise Prescription Increase Physical Activity;Increase Strength and Stamina;Understanding of Exercise Prescription Increase Physical Activity;Increase Strength and  Stamina;Understanding of Exercise Prescription Increase Physical Activity;Increase Strength and Stamina;Understanding of Exercise Prescription   Comments Reviewed RPE and dyspnea scale, THR and program prescription with pt today.  Pt voiced understanding and was given a copy of goals to take home. Briseida is off to a good start in the program, and was able to attend her first two sessions during this review period. During these sessions she was able to increase her treadmill workload to 2. and no incline, and increase to level 3 on the T4 nustep. We will continue to monitor her progress in the program. Enna is doing well in rehab. She recently increased her speed on the treadmill to 2.4 mph with no incline. She also improved to level 5 on the T4 nustep and level 3 on the T6 nustep. We will continue to monitor her progress in the program. Giorgia continues to do well in the program. She has been able to increase from level 2 to 3 on the biostep. She was also able to maintain her treadmill workload at 2. and no incline. We will continue to monitor her progress in the program. Juda is doing well in rehab. She recently improved to level 3 on the T5 nustep. She also decreased her speed on the treadmill from 2.4 mph to 2.2 mph on the treadmill. We will continue to monitor her progress in the program.   Expected Outcomes Short: Use RPE daily to regulate intensity. Long: Follow program prescription in THR. Short: Continue to follow exercise prescription. Long: Continue exercise to improve strength and stamina. Short: Continue to progressively increase workloads. Long: Continue exercise to improve strength and stamina. Short: Increase treadmill incline to 1%. Long: Continue exercise to improve strength and stamina. Short: Increase treadmill speed back up to 2.4 mph. Long: Continue exercise to improve strength and stamina.    Row Name 06/07/24 1120 06/07/24 1502 06/21/24 1410         Exercise Goal  Re-Evaluation   Exercise Goals Review Increase Physical Activity;Increase Strength and Stamina;Understanding of Exercise Prescription Increase Physical Activity;Increase Strength and Stamina;Understanding of Exercise Prescription Increase Physical Activity;Increase Strength and Stamina;Understanding of Exercise Prescription     Comments Randilyn is doing well with her exercise. She has been using the recumbent bike and treadmill that she owns at her home on days when she is away from rehab. She states that she is beginning to notice some of the benefits of exercise. We will continue to monitor her progress in the program. Alyshia continues to do well in rehab.  She was able to increase her workload on the treadmill to a speed of 2.2 mph and 1% incline. She also increased to level 3 on the biostep and level 3 on the recumbent bike with an average of 29 watts. We will continue to monitor her progress in the program. Sumie is doing well in the program. She continues to walk at 3 mph on the treadmill but did not have any incline during the last review period. She did improve back up to to level 3 on the T5 nustep. We will continue to monitor her progress in the program.     Expected Outcomes Short: Continue to exercise on days away from rehab. Long: Continue exercise to improve strength and stamina. Short: Continue to progressively increase treadmill workload. Long: Continue exercise to improve strength and stamina. Short: Add incline back to treadmill workload. Long: Continue exercise to improve strength and stamina.        Discharge Exercise Prescription (Final Exercise Prescription Changes):  Exercise Prescription Changes - 06/21/24 1400       Response to Exercise   Blood Pressure (Admit) 96/68    Blood Pressure (Exit) 102/62    Heart Rate (Admit) 63 bpm    Heart Rate (Exercise) 78 bpm    Heart Rate (Exit) 72 bpm    Rating of Perceived Exertion (Exercise) 13    Symptoms none    Duration Continue  with 30 min of aerobic exercise without signs/symptoms of physical distress.    Intensity THRR unchanged      Progression   Progression Continue to progress workloads to maintain intensity without signs/symptoms of physical distress.    Average METs 2.7      Resistance Training   Weight 2 lb    Reps 10-15      Interval Training   Interval Training No      Treadmill   MPH 2.2    Grade 0    Minutes 15    METs 2.69      T5 Nustep   Level 3    Minutes 15    METs 2.1      Biostep-RELP   Level 2    Minutes 15    METs 3      Oxygen   Maintain Oxygen Saturation 88% or higher          Nutrition:  Target Goals: Understanding of nutrition guidelines, daily intake of sodium 1500mg , cholesterol 200mg , calories 30% from fat and 7% or less from saturated fats, daily to have 5 or more servings of fruits and vegetables.  Education: Nutrition 1 -Group instruction provided by verbal, written material, interactive activities, discussions, models, and posters to present general guidelines for heart healthy nutrition including macronutrients, label reading, and promoting whole foods over processed counterparts. Education serves as pensions consultant of discussion of heart healthy eating for all. Written material provided at class time.    Education: Nutrition 2 -Group instruction provided by verbal, written material, interactive activities, discussions, models, and posters to present general guidelines for heart healthy nutrition including sodium, cholesterol, and saturated fat. Providing guidance of habit forming to improve blood pressure, cholesterol, and body weight. Written material provided at class time.     Biometrics:  Pre Biometrics - 03/29/24 1510       Pre Biometrics   Height 5' 2.6 (1.59 m)    Weight 135 lb 11.2 oz (61.6 kg)    Waist Circumference 32 inches    Hip Circumference 35.5 inches  Waist to Hip Ratio 0.9 %    BMI (Calculated) 24.35    Single Leg Stand 7  seconds           Nutrition Therapy Plan and Nutrition Goals:   Nutrition Assessments:  MEDIFICTS Score Key: >=70 Need to make dietary changes  40-70 Heart Healthy Diet <= 40 Therapeutic Level Cholesterol Diet   Picture Your Plate Scores: <59 Unhealthy dietary pattern with much room for improvement. 41-50 Dietary pattern unlikely to meet recommendations for good health and room for improvement. 51-60 More healthful dietary pattern, with some room for improvement.  >60 Healthy dietary pattern, although there may be some specific behaviors that could be improved.    Nutrition Goals Re-Evaluation:  Nutrition Goals Re-Evaluation     Row Name 05/10/24 1106 06/07/24 1124           Goals   Nutrition Goal Pt has deferred to meet with the RD at this time. Pt has deferred to meet with the RD at this time.         Nutrition Goals Discharge (Final Nutrition Goals Re-Evaluation):  Nutrition Goals Re-Evaluation - 06/07/24 1124       Goals   Nutrition Goal Pt has deferred to meet with the RD at this time.          Psychosocial: Target Goals: Acknowledge presence or absence of significant depression and/or stress, maximize coping skills, provide positive support system. Participant is able to verbalize types and ability to use techniques and skills needed for reducing stress and depression.   Education: Stress, Anxiety, and Depression - Group verbal and visual presentation to define topics covered.  Reviews how body is impacted by stress, anxiety, and depression.  Also discusses healthy ways to reduce stress and to treat/manage anxiety and depression. Written material provided at class time.   Education: Sleep Hygiene -Provides group verbal and written instruction about how sleep can affect your health.  Define sleep hygiene, discuss sleep cycles and impact of sleep habits. Review good sleep hygiene tips.   Initial Review & Psychosocial Screening:  Initial Psych Review &  Screening - 03/23/24 1343       Initial Review   Current issues with None Identified      Family Dynamics   Good Support System? Yes      Barriers   Psychosocial barriers to participate in program There are no identifiable barriers or psychosocial needs.;The patient should benefit from training in stress management and relaxation.      Screening Interventions   Interventions Encouraged to exercise;Provide feedback about the scores to participant;To provide support and resources with identified psychosocial needs    Expected Outcomes Long Term Goal: Stressors or current issues are controlled or eliminated.;Short Term goal: Utilizing psychosocial counselor, staff and physician to assist with identification of specific Stressors or current issues interfering with healing process. Setting desired goal for each stressor or current issue identified.;Short Term goal: Identification and review with participant of any Quality of Life or Depression concerns found by scoring the questionnaire.;Long Term goal: The participant improves quality of Life and PHQ9 Scores as seen by post scores and/or verbalization of changes          Quality of Life Scores:   Scores of 19 and below usually indicate a poorer quality of life in these areas.  A difference of  2-3 points is a clinically meaningful difference.  A difference of 2-3 points in the total score of the Quality of Life Index has been  associated with significant improvement in overall quality of life, self-image, physical symptoms, and general health in studies assessing change in quality of life.  PHQ-9: Review Flowsheet       03/29/2024  Depression screen PHQ 2/9  Decreased Interest 0  Down, Depressed, Hopeless 0  PHQ - 2 Score 0  Altered sleeping 2  Tired, decreased energy 1  Change in appetite 0  Feeling bad or failure about yourself  0  Trouble concentrating 0  Moving slowly or fidgety/restless 0  Suicidal thoughts 0  PHQ-9 Score 3    Difficult doing work/chores Not difficult at all    Details       Data saved with a previous flowsheet row definition        Interpretation of Total Score  Total Score Depression Severity:  1-4 = Minimal depression, 5-9 = Mild depression, 10-14 = Moderate depression, 15-19 = Moderately severe depression, 20-27 = Severe depression   Psychosocial Evaluation and Intervention:  Psychosocial Evaluation - 03/23/24 1352       Psychosocial Evaluation & Interventions   Interventions Encouraged to exercise with the program and follow exercise prescription    Comments Ms. 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 She states she feels like she has been recovering well besides being dizzy at times. They are working with her blood pressure medications to hopefully manage it better. She enjoys spending time with her horse, dog, and cat as well as staying active. She rides her recumbent bike 5 days a week. When she feels stressed, she takes some B1 and then focuses on doing positive things. She reports no current concerns    Expected Outcomes Short: attend cardiac rehab for education and exercise Long: develop and maitnain positive self care habits.    Continue Psychosocial Services  Follow up required by staff          Psychosocial Re-Evaluation:  Psychosocial Re-Evaluation     Row Name 05/10/24 1103 06/07/24 1122           Psychosocial Re-Evaluation   Current issues with None Identified None Identified      Comments Ataya reports no major stressors at this time. She states that when she deals with the daily stress of life she enjoys burning trash and sewing. She also states that exercise has been a good stress reliever for her. She is sleeping well at this time. She has a good support system made up by her husband, animals, neighbors and friends. Aliz reports no major stressors at this time. She states that when she deals with the daily stress of life she enjoys  burning trash and sewing. She also enjoys crafts where she gets to use her hands. She states that exercise has been a good stress reliever for her. She continues to sleep well at this time. She has a good support system made up by her husband, animals, neighbors and friends.      Expected Outcomes Short: Continue to relieve stress through exercise. Long: Maintain positive outlook. Short: Continue to relieve stress through exercise. Long: Maintain positive outlook.      Interventions Encouraged to attend Cardiac Rehabilitation for the exercise Encouraged to attend Cardiac Rehabilitation for the exercise      Continue Psychosocial Services  Follow up required by staff Follow up required by staff         Psychosocial Discharge (Final Psychosocial Re-Evaluation):  Psychosocial Re-Evaluation - 06/07/24 1122       Psychosocial Re-Evaluation  Current issues with None Identified    Comments Florice reports no major stressors at this time. She states that when she deals with the daily stress of life she enjoys burning trash and sewing. She also enjoys crafts where she gets to use her hands. She states that exercise has been a good stress reliever for her. She continues to sleep well at this time. She has a good support system made up by her husband, animals, neighbors and friends.    Expected Outcomes Short: Continue to relieve stress through exercise. Long: Maintain positive outlook.    Interventions Encouraged to attend Cardiac Rehabilitation for the exercise    Continue Psychosocial Services  Follow up required by staff          Vocational Rehabilitation: Provide vocational rehab assistance to qualifying candidates.   Vocational Rehab Evaluation & Intervention:  Vocational Rehab - 03/23/24 1342       Initial Vocational Rehab Evaluation & Intervention   Assessment shows need for Vocational Rehabilitation No          Education: Education Goals: Education classes will be provided on a  variety of topics geared toward better understanding of heart health and risk factor modification. Participant will state understanding/return demonstration of topics presented as noted by education test scores.  Learning Barriers/Preferences:  Learning Barriers/Preferences - 03/23/24 1342       Learning Barriers/Preferences   Learning Barriers None    Learning Preferences None          General Cardiac Education Topics:  AED/CPR: - Group verbal and written instruction with the use of models to demonstrate the basic use of the AED with the basic ABC's of resuscitation.   Test and Procedures: - Group verbal and visual presentation and models provide information about basic cardiac anatomy and function. Reviews the testing methods done to diagnose heart disease and the outcomes of the test results. Describes the treatment choices: Medical Management, Angioplasty, or Coronary Bypass Surgery for treating various heart conditions including Myocardial Infarction, Angina, Valve Disease, and Cardiac Arrhythmias. Written material provided at class time.   Medication Safety: - Group verbal and visual instruction to review commonly prescribed medications for heart and lung disease. Reviews the medication, class of the drug, and side effects. Includes the steps to properly store meds and maintain the prescription regimen. Written material provided at class time.   Intimacy: - Group verbal instruction through game format to discuss how heart and lung disease can affect sexual intimacy. Written material provided at class time.   Know Your Numbers and Heart Failure: - Group verbal and visual instruction to discuss disease risk factors for cardiac and pulmonary disease and treatment options.  Reviews associated critical values for Overweight/Obesity, Hypertension, Cholesterol, and Diabetes.  Discusses basics of heart failure: signs/symptoms and treatments.  Introduces Heart Failure Zone chart for  action plan for heart failure. Written material provided at class time.   Infection Prevention: - Provides verbal and written material to individual with discussion of infection control including proper hand washing and proper equipment cleaning during exercise session. Flowsheet Row Cardiac Rehab from 03/29/2024 in [facility] Cardiac and Pulmonary Rehab  Date 03/29/24  Educator [facility]  Instruction Review Code 1- Verbalizes Understanding    Falls Prevention: - Provides verbal and written material to individual with discussion of falls prevention and safety. Flowsheet Row Cardiac Rehab from 03/29/2024 in [facility] Cardiac and Pulmonary Rehab  Date 03/29/24  Educator [facility]  Instruction Review Code 1- Verbalizes Understanding    Other: -  Provides group and verbal instruction on various topics (see comments)   Knowledge Questionnaire Score:   Core Components/Risk Factors/Patient Goals at Admission:  Personal Goals and Risk Factors at Admission - 03/23/24 1339       Core Components/Risk Factors/Patient Goals on Admission    Weight Management Weight Loss;Yes    Intervention Weight Management: Develop a combined nutrition and exercise program designed to reach desired caloric intake, while maintaining appropriate intake of nutrient and fiber, sodium and fats, and appropriate energy expenditure required for the weight goal.;Weight Management: Provide education and appropriate resources to help participant work on and attain dietary goals.;Weight Management/Obesity: Establish reasonable short term and long term weight goals.    Goal Weight: Long Term 115 lb (52.2 kg)    Expected Outcomes Short Term: Continue to assess and modify interventions until short term weight is achieved;Long Term: Adherence to nutrition and physical activity/exercise program aimed toward attainment of established weight goal;Weight Loss: Understanding of general recommendations for a balanced deficit meal plan, which promotes 1-2 lb  weight loss per week and includes a negative energy balance of [phone] kcal/d;Understanding recommendations for meals to include 15-35% energy as protein, 25-35% energy from fat, 35-60% energy from carbohydrates, less than 200mg  of dietary cholesterol, 20-35 gm of total fiber daily;Understanding of distribution of calorie intake throughout the day with the consumption of 4-5 meals/snacks    Hypertension Yes    Intervention Provide education on lifestyle modifcations including regular physical activity/exercise, weight management, moderate sodium restriction and increased consumption of fresh fruit, vegetables, and low fat dairy, alcohol moderation, and smoking cessation.;Monitor prescription use compliance.    Expected Outcomes Long Term: Maintenance of blood pressure at goal levels.;Short Term: Continued assessment and intervention until BP is < 140/44mm HG in hypertensive participants. < 130/52mm HG in hypertensive participants with diabetes, heart failure or chronic kidney disease.    Lipids Yes    Intervention Provide education and support for participant on nutrition & aerobic/resistive exercise along with prescribed medications to achieve LDL 70mg , HDL >40mg .    Expected Outcomes Short Term: Participant states understanding of desired cholesterol values and is compliant with medications prescribed. Participant is following exercise prescription and nutrition guidelines.;Long Term: Cholesterol controlled with medications as prescribed, with individualized exercise RX and with personalized nutrition plan. Value goals: LDL < 70mg , HDL > 40 mg.          Education:Diabetes - Individual verbal and written instruction to review signs/symptoms of diabetes, desired ranges of glucose level fasting, after meals and with exercise. Acknowledge that pre and post exercise glucose checks will be done for 3 sessions at entry of program.   Core Components/Risk Factors/Patient Goals Review:   Goals and Risk  Factor Review     Row Name 05/10/24 1106 06/07/24 1124           Core Components/Risk Factors/Patient Goals Review   Personal Goals Review Weight Management/Obesity;Hypertension Weight Management/Obesity;Hypertension      Review Janeshia states that she is not currently working on weight loss. She is happy with where her weight is right now as she weighed in today at 130.8 lb. She states that her BP has been within normal ranges at most times. She states her BP was a little higher when she went for an infusion appointment, but it returned to normal quickly. She owns a BP cuff and checks her readings routinely. She continues to take all of her medications as prescribed. Leigha states that she is comfortable with where her weight is  at this time. She is not currently working on weight loss, but states that she would be happy if she did lose some weight by way of exercise. She has been checking her BP at home and states states that her BP has been within normal ranges at most times. She states that her BP has been lower sometimes after exercise with systolic readings in the 90s. She also continues to take all of her medications as prescribed.      Expected Outcomes Short: Continue to check BP at home routinely. Long: Continue to manage lifestyle risk factors. Short: Continue to check BP at home routinely. Long: Continue to manage lifestyle risk factors.         Core Components/Risk Factors/Patient Goals at Discharge (Final Review):   Goals and Risk Factor Review - 06/07/24 1124       Core Components/Risk Factors/Patient Goals Review   Personal Goals Review Weight Management/Obesity;Hypertension    Review Lenny states that she is comfortable with where her weight is at this time. She is not currently working on weight loss, but states that she would be happy if she did lose some weight by way of exercise. She has been checking her BP at home and states states that her BP has been within normal  ranges at most times. She states that her BP has been lower sometimes after exercise with systolic readings in the 90s. She also continues to take all of her medications as prescribed.    Expected Outcomes Short: Continue to check BP at home routinely. Long: Continue to manage lifestyle risk factors.          ITP Comments:  ITP Comments     Row Name 03/23/24 1356 03/29/24 1504 03/30/24 1153 04/05/24 1111 04/27/24 0956   ITP Comments Initial phone call completed. Diagnosis can be found in [facility] 8/26. EP Orientation scheduled for Tuesday 9/23 at 1:30. Completed and gym orientation for cardiac rehab. Initial ITP created and sent for review to Dr. Oneil Pinal, Medical Director. 30 Day review completed. Medical Director ITP review done; changes made as directed and signed approval by Medical Director. New to program. First full day of exercise!  Patient was oriented to gym and equipment including functions, settings, policies, and procedures.  Patient's individual exercise prescription and treatment plan were reviewed.  All starting workloads were established based on the results of the 6 minute walk test done at initial orientation visit.  The plan for exercise progression was also introduced and progression will be customized based on patient's performance and goals. 30 Day review completed. Medical Director ITP review done, changes made as directed, and signed approval by Medical Director.    Row Name 05/25/24 0935 06/22/24 1339         ITP Comments 30 Day review completed. Medical Director ITP review done, changes made as directed, and signed approval by Medical Director. 30 Day review completed. Medical Director ITP review done, changes made as directed, and signed approval by Medical Director.         Comments: 30 Day Review     [1]  Current Outpatient Medications:    acetaminophen  (TYLENOL ) 500 MG tablet, Take 1-2 tablets (500-1,000 mg total) by mouth every 6 (six) hours as needed.,  Disp: , Rfl:    alendronate (FOSAMAX) 70 MG tablet, Take 70 mg by mouth once a week. Take with a full glass of water  on an empty stomach., Disp: , Rfl:    amoxicillin  (AMOXIL ) 500 MG tablet,  Take 4 tablets (2,000 mg total) by mouth as directed. to be taken 30 to 60 minutes before dental procedure, Disp: 4 tablet, Rfl: 0   apixaban  (ELIQUIS ) 5 MG TABS tablet, Take 1 tablet (5 mg total) by mouth 2 (two) times daily., Disp: 180 tablet, Rfl: 1   Apoaequorin (PREVAGEN PO), Take 1 tablet by mouth daily., Disp: , Rfl:    calcium  carbonate (OSCAL) 1500 (600 Ca) MG TABS tablet, Take by mouth 2 (two) times daily with a meal., Disp: , Rfl:    Calcium  Carbonate-Vit D-Min (CALCIUM  1200 PO), Take 1 tablet by mouth daily at 6 (six) AM., Disp: , Rfl:    empagliflozin  (JARDIANCE ) 10 MG TABS tablet, Take 1 tablet (10 mg total) by mouth daily before breakfast., Disp: 90 tablet, Rfl: 3   ferrous sulfate  325 (65 FE) MG tablet, Take 325 mg by mouth daily., Disp: , Rfl:    losartan  (COZAAR ) 50 MG tablet, Take 1 tablet (50 mg total) by mouth daily., Disp: 90 tablet, Rfl: 3   metoprolol  succinate (TOPROL -XL) 100 MG 24 hr tablet, Take 0.5 tablets (50 mg total) by mouth daily. Take with or immediately following a meal., Disp: , Rfl:    ondansetron  (ZOFRAN ) 4 MG tablet, Take 1 tablet (4 mg total) by mouth every 8 (eight) hours as needed for nausea or vomiting., Disp: 90 tablet, Rfl: 1   rosuvastatin  (CRESTOR ) 20 MG tablet, Take 20 mg by mouth daily., Disp: , Rfl:    thiamine  (VITAMIN B-1) 100 MG tablet, Take 100 mg by mouth daily., Disp: , Rfl:    traMADol  (ULTRAM ) 50 MG tablet, Take 1 tablet (50 mg total) by mouth every 6 (six) hours as needed for severe pain (pain score 7-10)., Disp: 28 tablet, Rfl: 0   Vedolizumab  (ENTYVIO  IV), Inject 1 Dose as directed every 8 (eight) weeks. Experimental every two months, given at the MD office, Disp: , Rfl:    VITAMIN D  PO, Take 1 tablet by mouth daily., Disp: , Rfl:  [2]  Social  History Tobacco Use  Smoking Status Former   Current packs/day: 0.00   Types: Cigarettes   Quit date: 06/20/1989   Years since quitting: 35.0  Smokeless Tobacco Never  Tobacco Comments   smoked occasional cigarettes for 5 years

## 2024-06-23 ENCOUNTER — Encounter

## 2024-06-23 DIAGNOSIS — Z952 Presence of prosthetic heart valve: Secondary | ICD-10-CM | POA: Diagnosis not present

## 2024-06-23 NOTE — Progress Notes (Signed)
 Daily Session Note  Patient Details  Name: Hannah Willis MRN: [id number] Date of Birth: 1951/02/07 Referring Provider:   Flowsheet Row Cardiac Rehab from 03/29/2024 in [facility] Cardiac and Pulmonary Rehab  Referring Provider Dr. Newman Lawrence    Encounter Date: 06/23/2024  Check In:  Session Check In - 06/23/24 1113       Check-In   Supervising physician immediately available to respond to emergencies See telemetry face sheet for immediately available ER MD    Location [facility]-Cardiac & Pulmonary Rehab    Staff Present Leita Franks RN,BSN;[facility] BS, Exercise Physiologist;Margaret Best, MS, Exercise Physiologist    Virtual Visit No    Medication changes reported     No    Fall or balance concerns reported    No    Tobacco Cessation No Change    Warm-up and Cool-down Performed on first and last piece of equipment    Resistance Training Performed Yes    VAD Patient? No    PAD/SET Patient? No      Pain Assessment   Currently in Pain? No/denies             Tobacco Use History[1]  Goals Met:  Independence with exercise equipment Exercise tolerated well No report of concerns or symptoms today Strength training completed today  Goals Unmet:  Not Applicable  Comments: Pt able to follow exercise prescription today without complaint.  Will continue to monitor for progression.    Dr. Oneil Pinal is Medical Director for [facility] Cardiac Rehabilitation.  Dr. Fuad Aleskerov is Medical Director for [facility] Pulmonary Rehabilitation.    [1]  Social History Tobacco Use  Smoking Status Former   Current packs/day: 0.00   Types: Cigarettes   Quit date: 06/20/1989   Years since quitting: 35.0  Smokeless Tobacco Never  Tobacco Comments   smoked occasional cigarettes for 5 years

## 2024-06-28 ENCOUNTER — Ambulatory Visit (INDEPENDENT_AMBULATORY_CARE_PROVIDER_SITE_OTHER)

## 2024-06-28 ENCOUNTER — Encounter

## 2024-06-28 VITALS — BP 110/57 | HR 63 | Temp 97.3°F | Resp 16 | Ht 62.0 in | Wt 132.2 lb

## 2024-06-28 DIAGNOSIS — Z952 Presence of prosthetic heart valve: Secondary | ICD-10-CM

## 2024-06-28 DIAGNOSIS — K5130 Ulcerative (chronic) rectosigmoiditis without complications: Secondary | ICD-10-CM

## 2024-06-28 MED ORDER — VEDOLIZUMAB 300 MG IV SOLR
300.0000 mg | Freq: Once | INTRAVENOUS | Status: AC
  Administered 2024-06-28: 300 mg via INTRAVENOUS
  Filled 2024-06-28: qty 5

## 2024-06-28 NOTE — Progress Notes (Signed)
 Diagnosis: Ulcerative Colitis  Provider:  Mannam, Praveen MD  Procedure: IV Infusion  IV Type: Peripheral, IV Location: L Antecubital  Entyvio  (Vedolizumab ), Dose: 300 mg  Infusion Start Time: 1339  Infusion Stop Time: 1411  Post Infusion IV Care: Peripheral IV Discontinued  Discharge: Condition: Stable, Destination: Home . AVS Provided  Performed by:  Rocky FORBES Sar, RN

## 2024-06-28 NOTE — Progress Notes (Signed)
 Daily Session Note  Patient Details  Name: Hannah Willis MRN: [id number] Date of Birth: March 25, 1951 Referring Provider:   Flowsheet Row Cardiac Rehab from 03/29/2024 in [facility] Cardiac and Pulmonary Rehab  Referring Provider Dr. Newman Lawrence    Encounter Date: 06/28/2024  Check In:  Session Check In - 06/28/24 1037       Check-In   Supervising physician immediately available to respond to emergencies See telemetry face sheet for immediately available ER MD    Location [facility]-Cardiac & Pulmonary Rehab    Staff Present Burnard Davenport RN,BSN,MPA;Laura Cates RN,BSN;Kristen Coble RN,BC,MSN;Jason Elnor [facility]    Virtual Visit No    Medication changes reported     No    Fall or balance concerns reported    No    Tobacco Cessation No Change    Warm-up and Cool-down Performed on first and last piece of equipment    Resistance Training Performed Yes    VAD Patient? No    PAD/SET Patient? No      Pain Assessment   Currently in Pain? No/denies             Tobacco Use History[1]  Goals Met:  Proper associated with RPD/PD & O2 Sat Independence with exercise equipment Exercise tolerated well No report of concerns or symptoms today Strength training completed today  Goals Unmet:  Not Applicable  Comments: Pt able to follow exercise prescription today without complaint.  Will continue to monitor for progression.    Dr. Oneil Pinal is Medical Director for [facility] Cardiac Rehabilitation.  Dr. Fuad Aleskerov is Medical Director for [facility] Pulmonary Rehabilitation.    [1]  Social History Tobacco Use  Smoking Status Former   Current packs/day: 0.00   Types: Cigarettes   Quit date: 06/20/1989   Years since quitting: 35.0  Smokeless Tobacco Never  Tobacco Comments   smoked occasional cigarettes for 5 years

## 2024-07-05 ENCOUNTER — Encounter

## 2024-07-05 DIAGNOSIS — Z952 Presence of prosthetic heart valve: Secondary | ICD-10-CM

## 2024-07-05 NOTE — Progress Notes (Signed)
 Daily Session Note  Patient Details  Name: Hannah Willis MRN: [id number] Date of Birth: 12/16/1950 Referring Provider:   Flowsheet Row Cardiac Rehab from 03/29/2024 in [facility] Cardiac and Pulmonary Rehab  Referring Provider Dr. Newman Lawrence    Encounter Date: 07/05/2024  Check In:  Session Check In - 07/05/24 1110       Check-In   Supervising physician immediately available to respond to emergencies See telemetry face sheet for immediately available ER MD    Location [facility]-Cardiac & Pulmonary Rehab    Staff Present Burnard Davenport RN,BSN,MPA;Meredith Tressa RN,BSN;Maxon Conetta BS, Exercise Physiologist;Jason Elnor RDN,LDN    Virtual Visit No    Medication changes reported     No    Fall or balance concerns reported    No    Tobacco Cessation No Change    Warm-up and Cool-down Performed on first and last piece of equipment    Resistance Training Performed Yes    VAD Patient? No    PAD/SET Patient? No      Pain Assessment   Currently in Pain? No/denies             Tobacco Use History[1]  Goals Met:  Proper associated with RPD/PD & O2 Sat Independence with exercise equipment Exercise tolerated well No report of concerns or symptoms today Strength training completed today  Goals Unmet:  Not Applicable  Comments: Pt able to follow exercise prescription today without complaint.  Will continue to monitor for progression.    Dr. Oneil Pinal is Medical Director for [facility] Cardiac Rehabilitation.  Dr. Fuad Aleskerov is Medical Director for [facility] Pulmonary Rehabilitation.    [1]  Social History Tobacco Use  Smoking Status Former   Current packs/day: 0.00   Types: Cigarettes   Quit date: 06/20/1989   Years since quitting: 35.0  Smokeless Tobacco Never  Tobacco Comments   smoked occasional cigarettes for 5 years

## 2024-07-12 ENCOUNTER — Encounter: Attending: Cardiology

## 2024-07-12 DIAGNOSIS — Z48812 Encounter for surgical aftercare following surgery on the circulatory system: Secondary | ICD-10-CM | POA: Diagnosis present

## 2024-07-12 DIAGNOSIS — Z952 Presence of prosthetic heart valve: Secondary | ICD-10-CM | POA: Insufficient documentation

## 2024-07-12 NOTE — Progress Notes (Signed)
 Daily Session Note  Patient Details  Name: Hannah Willis MRN: [id number] Date of Birth: March 30, 1951 Referring Provider:   Flowsheet Row Cardiac Rehab from 03/29/2024 in [facility]  Referring Provider Dr. Newman Lawrence    Encounter Date: 07/12/2024  Check In:  Session Check In - 07/12/24 1113       Check-In   Supervising physician immediately available to respond to emergencies See telemetry face sheet for immediately available ER MD    Location [facility]-Cardiac & Pulmonary Re[facility]    Staff Present Burnard Davenport RN,BSN,MPA;Meredith Tressa RN,BSN;Noah Tickle, BS, Exercise Physiologist;Jason Elnor RDN,LDN;Maxon Conetta BS, Exercise Physiologist    Virtual Visit No    Medication changes reported     No    Fall or balance concerns reported    No    Tobacco Cessation No Change    Warm-up and Cool-down Performed on first and last piece of equipment    Resistance Training Performed Yes    VAD Patient? No    PAD/SET Patient? No      Pain Assessment   Currently in Pain? No/denies             Tobacco Use History[1]  Goals Met:  Proper associated with RPD/PD & O2 Sat Independence with exercise equipment Exercise tolerated well No report of concerns or symptoms today Strength training completed today  Goals Unmet:  Not Applicable  Comments: Pt able to follow exercise prescription today without complaint.  Will continue to monitor for progression.    Dr. Oneil Pinal is Medical Director for [facility] Card[facility].  Dr. Fuad Aleskerov is Medical Director for [facility].    [1]  Social History Tobacco Use  Smoking Status Former   Current packs/day: 0.00   Types: Cigarettes   Quit date: 06/20/1989   Years since quitting: 35.0  Smokeless Tobacco Never  Tobacco Comments   smoked occasional cigarettes for 5 years

## 2024-07-13 ENCOUNTER — Ambulatory Visit: Admitting: Gastroenterology

## 2024-07-13 ENCOUNTER — Encounter: Payer: Self-pay | Admitting: Gastroenterology

## 2024-07-13 VITALS — BP 102/70 | HR 36 | Ht 62.0 in | Wt 132.8 lb

## 2024-07-13 DIAGNOSIS — K5130 Ulcerative (chronic) rectosigmoiditis without complications: Secondary | ICD-10-CM | POA: Diagnosis not present

## 2024-07-13 NOTE — Progress Notes (Signed)
 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 Obie. She was initially treated with Lialda  2.4gm daily as well as Hydrocortizone enemas which helped her symptoms. She has been maintained with Lialda  since that time, Rowasa  added to regimen.  Had persistent elevation in fecal calprotectin, colonoscopy with mild inflammation, transitioned to Entyvio  Spring 2025.   SINCE LAST VISIT   74 year old female here for follow-up for her left-sided colitis, companied by her husband today.  Recall she previously was on Lialda  and Rowasa  enemas for years.  On this regimen she had persistently elevated fecal calprotectin and intermittent symptoms as well as active disease on her last few colonoscopies.  We discussed long-term regimen and transitioned her to Entyvio  in March 2025.  She has been maintained on infusions every 8 weeks.  She has done clinically quite well on the regimen.  She states she is very happy not to use the enema therapy anymore and the infusions are well-tolerated, and appreciates this therapy in regards to lifestyle etc.  She has not had any flares of her symptoms since being on it.  She denies any problems with her bowel habits.  Usually 2-3 bowel moods per day that are formed without blood.  No urgency.  No abdominal pains.  She has had some cardiac issues since have last seen her.  She had severe aortic regurgitation in the setting of an aortic aneurysm.  She has been seen by cardiothoracic surgery and cardiology, she had aortic valve replacement and repair of the aneurysm a few months ago.  She has had post operative A-fib and bradycardia that she has been followed for.  Her resting heart rates in the 30s.  She states when she exercises her blood pressure goes down a bit but at baseline she feels okay.  She is still in cardiac rehab.    Prior colonoscopy exams: Colonoscopy 02/25/22: The perianal and digital rectal examinations were normal. - The  terminal ileum appeared normal. - Multiple small-mouthed diverticula were found in the sigmoid colon. - Areas of mildly erythematous mucosa was found in the recto-sigmoid colon and in the distal sigmoid colon near areas of diverticulum. Unclear if this represents segmental / diverticular related colitis more so than ulcerative colitis? Biopsies were taken with a cold forceps for histology. - The exam was otherwise without abnormality. - Biopsies were taken with a cold forceps in the rectum for histology.   1. Surgical [P], colon, sigmoid/recto-sigmoid - MINIMALLY ACTIVE CHRONIC COLITIS CONSISTENT WITH INFLAMMATORY BOWEL DISEASE. - NEGATIVE FOR DYSPLASIA. 2. Surgical [P], colon, rectum - MILDLY ACTIVE CHRONIC COLITIS CONSISTENT WITH INFLAMMATORY BOWEL DISEASE. - NEGATIVE FOR DYSPLASIA.     Colonoscopy 11/28/19: The perianal and digital rectal examinations were normal. - Multiple small-mouthed diverticula were found in the sigmoid colon and ascending colon. - A small area of granular mucosa with loss of vascularity - mild was found in the rectosigmoid colon in an area of diverticulosis - unclear if superficial changes from diverticulitis vs. mild active coliis. - Internal hemorrhoids were found during retroflexion. The hemorrhoids were small. - The exam was otherwise without abnormality. No other areas of inflammation appreciated. No polyps. - Biopsies were taken with a cold forceps in the rectum, in the sigmoid colon, in the descending colon and at the splenic flexure for histology.   1. Surgical [P], random left colon bx - FOCAL EROSION WITH MILD INFLAMMATION. - NEGATIVE FOR DYSPLASIA. 2. Surgical [P], colon, rectum/rectosigmoid - MINIMALLY ACTIVE  CHRONIC COLITIS. - NEGATIVE FOR DYSPLASIA. - NO GRANULOMAS.     Colonoscopy 09/10/2017 - Preparation of the colon was fair, could not clear the cecal cap. - The examined portion of the ileum was normal. - One 4 mm polyp in the transverse  colon, removed with a cold snare. Resected and retrieved. - Diverticulosis in the sigmoid colon. - The examination was otherwise normal on direct and retroflexion views. No active inflammation. - Biopsies for surveillance were taken from the descending colon, sigmoid colon and rectum   Path showed focal active colitis, adenomatous polyp   Colonoscopy December 2015 - left sided colitis, path c/w chronic active colitis         Fecal calprotectin 03/2023: 192 - increased lialda  back to 4 tabs per day and continued Rowasa    Fecal calprotectin 06/24/23: 123      CT abdomen / pelvis 11/13/23: IMPRESSION: No acute findings in the abdomen or pelvis.     Cardiology workup recently: Cardiac cath 12/25/23: Mild nonobstructive coronary artery disease Elevated LVEDP in the setting of severe aortic regurgitation and reduced LVEF Refer to cardiothoracic surgery for consideration for aortic valve and possible aortic root replacement   TEE 12/25/23: IMPRESSIONS   1. AI is central with eccentric course; likely due to dilated aortic  root.   2. Left ventricular ejection fraction, by estimation, is 45 to 50%. The  left ventricle has mildly decreased function. The left ventricle  demonstrates global hypokinesis. The left ventricular internal cavity size  was moderately dilated.   3. Right ventricular systolic function is normal. The right ventricular  size is normal.   4. Left atrial size was severely dilated. No left atrial/left atrial  appendage thrombus was detected.   5. 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 There is moderate holosystolic prolapse of the middle  segment of the anterior leaflet of the mitral valve.   6. The aortic valve is tricuspid. Aortic valve regurgitation is severe.  Aortic valve sclerosis is present, with no evidence of aortic valve  stenosis.   7. Aortic dilatation noted. There is moderate dilatation of the aortic  root,  measuring 47 mm. There is Moderate (Grade III) plaque involving the  descending aorta.   8. 3D performed of the mitral valve and 3D performed of the aortic valve.      CTA 01/05/24: IMPRESSION: 1. 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 2. No effacement of the sino-tubular junction and mildly ectatic but nonaneurysmal ascending aorta with normal transverse and descending thoracic aorta diameters. 3. Aortic and coronary artery atherosclerotic vascular calcifications. 4. Four vessel arch anatomy. The left vertebral artery arises directly from the aorta. 5. Cardiomegaly with prominent left atrial enlargement and mild left ventricular dilatation. 6. Nonobstructing left lower pole nephrolithiasis. 7. Multilevel degenerative disc disease.      Fecal calprotectin 04/22/24: 284  Echocardiogram 06/2024:  1. Left ventricular ejection fraction, by estimation, is 55 to 60%. The  left ventricle has normal function. The left ventricle has no regional  wall motion abnormalities. Left ventricular diastolic parameters are  consistent with Grade II diastolic  dysfunction (pseudonormalization).   2. Right ventricular systolic function is normal. The right ventricular  size is normal. There is normal pulmonary artery systolic pressure.   3. 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 No evidence of mitral stenosis. There is moderate late  systolic prolapse of the  medial segment of the anterior leaflet of the  mitral valve.   4. The aortic valve has been repaired/replaced. Aortic valve  regurgitation is not visualized. No aortic stenosis is present. There is a  25 mm Edwards Konnect Resilia valve present in the aortic position.  Procedure Date: 02/08/2024.   5. Aortic root/ascending aorta has been repaired/replaced.   6. The inferior vena cava is normal in size with greater than 50%  respiratory variability,  suggesting right atrial pressure of 3 mmHg.   Comparison(s): LVEF improved from prior.    Past Medical History:  Diagnosis Date   Allergy    Apnea, sleep    no c-pap machine   Arthritis    OA   Basal cell carcinoma    on front chest   Blood in stool    LAST 2 MONTHS, MEDS CHNAGED FOR   GERD (gastroesophageal reflux disease)    Heart murmur    HFrEF (heart failure with reduced ejection fraction) (HCC) 01/20/2024   Hyperlipidemia    Hypertension    Kidney stones    Laryngitis    started monday night   Neuromuscular disorder (HCC)    Post-operative nausea and vomiting    Severe aortic insufficiency    Severe aortic regurgitation 12/21/2023   Spherocytosis (familial)    Stroke (HCC) 09/21/2007   Synovitis of right hand 12/29/2015   and wrist   Thoracic aortic aneurysm    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   Ulcerative colitis (HCC)    Weakness of right hand    due to stroke     Past Surgical History:  Procedure Laterality Date   BENTALL PROCEDURE N/A 02/08/2024   Procedure: BENTALL PROCEDURE USING KONECT AORTIC VALVE CONDUIT SIZE ;  Surgeon: Lucas Dorise POUR, MD;  Location: [facility] OR;  Service: Open Heart Surgery;  Laterality: N/A;   COLONSCOPY     INTRAOPERATIVE TRANSESOPHAGEAL ECHOCARDIOGRAM N/A 02/08/2024   Procedure: ECHOCARDIOGRAM, TRANSESOPHAGEAL, INTRAOPERATIVE;  Surgeon: Lucas Dorise POUR, MD;  Location: [facility] OR;  Service: Open Heart Surgery;  Laterality: N/A;   RIGHT/LEFT HEART CATH AND CORONARY ANGIOGRAPHY N/A 12/25/2023   Procedure: RIGHT/LEFT HEART CATH AND CORONARY ANGIOGRAPHY;  Surgeon: Elmira Newman PARAS, MD;  Location: [facility] INVASIVE CV LAB;  Service: Cardiovascular;  Laterality: N/A;   SHOULDER ARTHROSCOPY W/ ROTATOR CUFF REPAIR Right 2010   SPLENECTOMY, 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   TOTAL KNEE ARTHROPLASTY Left 2001   TOTAL KNEE ARTHROPLASTY Right 12/31/2015   Procedure: RIGHT TOTAL KNEE ARTHROPLASTY;  Surgeon: Donnice Car, MD;  Location: [facility]  ORS;  Service: Orthopedics;  Laterality: Right;   TRANSESOPHAGEAL ECHOCARDIOGRAM (CATH LAB) N/A 12/25/2023   Procedure: TRANSESOPHAGEAL ECHOCARDIOGRAM;  Surgeon: Pietro Redell RAMAN, MD;  Location: [facility] INVASIVE CV LAB;  Service: Cardiovascular;  Laterality: N/A;   Family History  Problem Relation Age of Onset   Diabetes Father    Mental illness Father    Narcolepsy Father    Heart disease Paternal Grandmother    Diabetes Paternal Grandmother    Heart attack Maternal Grandfather    Stroke Maternal Grandmother    Colon cancer Neg Hx    Esophageal cancer Neg Hx    Rectal cancer Neg Hx    Stomach cancer Neg Hx    Social History[1] Current Outpatient Medications  Medication Sig Dispense Refill   acetaminophen  (TYLENOL ) 500 MG tablet Take 1-2 tablets (500-1,000 mg total) by mouth every 6 (six) hours as needed.     alendronate (  FOSAMAX) 70 MG tablet Take 70 mg by mouth once a week. Take with a full glass of water  on an empty stomach.     apixaban  (ELIQUIS ) 5 MG TABS tablet Take 1 tablet (5 mg total) by mouth 2 (two) times daily. 180 tablet 1   Apoaequorin (PREVAGEN PO) Take 1 tablet by mouth daily.     calcium  carbonate (OSCAL) 1500 (600 Ca) MG TABS tablet Take by mouth 2 (two) times daily with a meal.     Calcium  Carbonate-Vit D-Min (CALCIUM  1200 PO) Take 1 tablet by mouth daily at 6 (six) AM.     dextromethorphan-guaiFENesin  (MUCINEX  DM) 30-600 MG 12hr tablet Take 1 tablet by mouth 2 (two) times daily.     empagliflozin  (JARDIANCE ) 10 MG TABS tablet Take 1 tablet (10 mg total) by mouth daily before breakfast. 90 tablet 3   ferrous sulfate  325 (65 FE) MG tablet Take 325 mg by mouth daily.     losartan  (COZAAR ) 50 MG tablet Take 1 tablet (50 mg total) by mouth daily. 90 tablet 3   metoprolol  succinate (TOPROL -XL) 100 MG 24 hr tablet Take 0.5 tablets (50 mg total) by mouth daily. Take with or immediately following a meal.     ondansetron  (ZOFRAN ) 4 MG tablet Take 1 tablet (4 mg total) by mouth  every 8 (eight) hours as needed for nausea or vomiting. 90 tablet 1   rosuvastatin  (CRESTOR ) 20 MG tablet Take 20 mg by mouth daily.     thiamine  (VITAMIN B-1) 100 MG tablet Take 100 mg by mouth daily.     traMADol  (ULTRAM ) 50 MG tablet Take 1 tablet (50 mg total) by mouth every 6 (six) hours as needed for severe pain (pain score 7-10). 28 tablet 0   Vedolizumab  (ENTYVIO  IV) Inject 1 Dose as directed every 8 (eight) weeks. Experimental every two months, given at the MD office     VITAMIN D  PO Take 1 tablet by mouth daily.     No current facility-administered medications for this visit.   Allergies[2]   Review of Systems: All systems reviewed and negative except where noted in HPI.    ECHOCARDIOGRAM COMPLETE Result Date: 06/15/2024    ECHOCARDIOGRAM REPORT   Patient Name:   TINEA NOBILE [facility] Date of Exam: 06/15/2024 Medical Rec #:  [id number]             Height:       62.0 in Accession #:    [id number]            Weight:       131.4 lb Date of Birth:  1950-09-06             BSA:          1.599 m Patient Age:    73 years              BP:           115/88 mmHg Patient Gender: F                     HR:           51 bpm. Exam Location:  [address] Procedure: 2D Echo, Cardiac Doppler and Color Doppler (Both Spectral and Color            Flow Doppler were utilized during procedure). 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 02/08/2024 Aortic root and ascending repair.                 Aortic Valve: 25 mm Edwards Konnect Resilia valve is present in                 the aortic position. Procedure Date: 02/08/2024.  Sonographer:    Waldo Guadalajara RCS Referring Phys: 5 TESSA N CONTE IMPRESSIONS  1. Left ventricular ejection fraction, by estimation, is 55 to 60%. The left ventricle has normal function. The left ventricle has no regional wall motion abnormalities. Left ventricular diastolic  parameters are consistent with Grade II diastolic dysfunction (pseudonormalization).  2. Right ventricular systolic function is normal. The right ventricular size is normal. There is normal pulmonary artery systolic pressure.  3. The mitral valve is myxomatous. Mild to moderate mitral valve regurgitation. No evidence of mitral stenosis. There is moderate late systolic prolapse of the medial segment of the anterior leaflet of the mitral valve.  4. The aortic valve has been repaired/replaced. Aortic valve regurgitation is not visualized. No aortic stenosis is present. There is a 25 mm Edwards Konnect Resilia valve present in the aortic position. Procedure Date: 02/08/2024.  5. Aortic root/ascending aorta has been repaired/replaced.  6. The inferior vena cava is normal in size with greater than 50% respiratory variability, suggesting right atrial pressure of 3 mmHg. Comparison(s): LVEF improved from prior. FINDINGS  Left Ventricle: Left ventricular ejection fraction, by estimation, is 55 to 60%. The left ventricle has normal function. The left ventricle has no regional wall motion abnormalities. The left ventricular internal cavity size was normal in size. There is  no left ventricular hypertrophy. Left ventricular diastolic parameters are consistent with Grade II diastolic dysfunction (pseudonormalization). Right Ventricle: The right ventricular size is normal. No increase in right ventricular wall thickness. Right ventricular systolic function is normal. There is normal pulmonary artery systolic pressure. The tricuspid regurgitant velocity is 2.35 m/s, and  with an assumed right atrial pressure of 3 mmHg, the estimated right ventricular systolic pressure is 25.1 mmHg. Left Atrium: Left atrial size was normal in size. Right Atrium: Right atrial size was normal in size. Pericardium: There is no evidence of pericardial effusion. Mitral Valve: The mitral valve is myxomatous. There is moderate late systolic prolapse of  the medial segment of the anterior leaflet of the mitral valve. Mild to moderate mitral valve regurgitation. No evidence of mitral valve stenosis. Tricuspid Valve: The tricuspid valve is normal in structure. Tricuspid valve regurgitation is mild . No evidence of tricuspid stenosis. Aortic Valve: The aortic valve has been repaired/replaced. Aortic valve regurgitation is not visualized. No aortic stenosis is present. Aortic valve mean gradient measures 6.0 mmHg. Aortic valve peak gradient measures 9.6 mmHg. Aortic valve area, by VTI measures 1.54 cm. There is a 25 mm Edwards Konnect Resilia valve present in the aortic position. Procedure Date: 02/08/2024. Pulmonic Valve: The pulmonic valve was normal in structure. Pulmonic valve regurgitation is trivial. No evidence of pulmonic stenosis. Aorta: The aortic root/ascending aorta has been repaired/replaced. Venous: The inferior vena cava is normal in size with greater than 50% respiratory variability, suggesting right atrial pressure of 3 mmHg. IAS/Shunts: No atrial level shunt detected by color flow Doppler.  LEFT VENTRICLE PLAX 2D LVIDd:         5.00 cm   Diastology LVIDs:         3.00 cm   LV e' medial:    4.24 cm/s LV PW:  1.20 cm   LV E/e' medial:  14.8 LV IVS:        1.10 cm   LV e' lateral:   9.68 cm/s LVOT diam:     2.00 cm   LV E/e' lateral: 6.5 LV SV:         45 LV SV Index:   28 LVOT Area:     3.14 cm LV IVRT:       116 msec  RIGHT VENTRICLE RV Basal diam:  2.90 cm     PULMONARY VEINS RV S prime:     12.50 cm/s  A Reversal Velocity: 99.60 cm/s RVSP:           25.1 mmHg   Diastolic Velocity:  64.50 cm/s                             S/D Velocity:        1.30                             Systolic Velocity:   83.80 cm/s LEFT ATRIUM             Index        RIGHT ATRIUM           Index LA diam:        3.90 cm 2.44 cm/m   RA Pressure: 3.00 mmHg LA Vol (A2C):   52.5 ml 32.83 ml/m  RA Area:     13.50 cm LA Vol (A4C):   34.7 ml 21.70 ml/m  RA Volume:   32.00  ml  20.01 ml/m LA Biplane Vol: 44.1 ml 27.58 ml/m  AORTIC VALVE AV Area (Vmax):    1.88 cm AV Area (Vmean):   1.52 cm AV Area (VTI):     1.54 cm AV Vmax:           155.00 cm/s AV Vmean:          120.000 cm/s AV VTI:            0.292 m AV Peak Grad:      9.6 mmHg AV Mean Grad:      6.0 mmHg LVOT Vmax:         92.60 cm/s LVOT Vmean:        58.000 cm/s LVOT VTI:          0.143 m LVOT/AV VTI ratio: 0.49  AORTA Ao Root diam: 3.60 cm Ao Asc diam:  3.20 cm MITRAL VALVE               TRICUSPID VALVE MV Area (PHT): 3.83 cm    TR Peak grad:   22.1 mmHg MV Decel Time: 198 msec    TR Vmax:        235.00 cm/s MR Peak grad: 68.2 mmHg    Estimated RAP:  3.00 mmHg MR Mean grad: 45.0 mmHg    RVSP:           25.1 mmHg MR Vmax:      413.00 cm/s MR Vmean:     315.0 cm/s   SHUNTS MV E velocity: 62.90 cm/s  Systemic VTI:  0.14 m MV A velocity: 84.40 cm/s  Systemic Diam: 2.00 cm MV E/A ratio:  0.75 Morene Brownie Electronically signed by Morene Brownie Signature Date/Time: 06/15/2024/5:02:06 PM    Final     Physical Exam: BP 102/70   Pulse ROLLEN)  36   Ht 5' 2 (1.575 m)   Wt 132 lb 12.8 oz (60.2 kg)   BMI 24.29 kg/m  Constitutional: Pleasant,well-developed, female in no acute distress. Neurological: Alert and oriented to person place and time. Psychiatric: Normal mood and affect. Behavior is normal.   ASSESSMENT: 74 y.o. female here for assessment of the following  1. 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  Clinically doing quite well without any symptoms that bother her at all.  She appreciates being off the enema therapy which was bothersome for her in the past.  She did have an elevated fecal calprotectin back in October.  She was getting ready for a major surgery as above which she completed and recovering with cardiac rehab now.  Fortunately she has had no flares of her symptoms and continues to do well.  I do think at some point a  colonoscopy would be useful to assess for mucosal healing and remission, however given her recent cardiac surgery and in cardiac rehab now, we will give her more time to recover before scheduling this.  We discussed about potentially repeating a fecal calprotectin to trend this however given she is clinically feeling well, would likely not make any change in therapy based on lab value.  Will plan on continuing Entyvio  for now, we will have her follow-up in 6 months for reassessment.  If any flares of symptoms in the interim she will contact us .  If she is fully recovered from her cardiac surgery at her next visit we will plan on colonoscopy at that time.  She understands and agrees with the plan  PLAN: - continue entyvio  - colonoscopy later this year - will give more time to recover from her surgery - as long as feels well, will hold off on repeat fecal calprotectin -  f/u 6 months or sooner with issues  Marcey Naval, MD [facility]     [1]  Social History Tobacco Use   Smoking status: Former    Current packs/day: 0.00    Types: Cigarettes    Quit date: 06/20/1989    Years since quitting: 35.0   Smokeless tobacco: Never   Tobacco comments:    smoked occasional cigarettes for 5 years  Vaping Use   Vaping status: Never Used  Substance Use Topics   Alcohol use: No    Alcohol/week: 0.0 standard drinks of alcohol   Drug use: No  [2]  Allergies Allergen Reactions   Clindamycin Hcl Other (See Comments)   Codeine Nausea And Vomiting   Morphine  And Codeine     Causes hallucinations    "

## 2024-07-13 NOTE — Patient Instructions (Signed)
 Follow up in 6 months.  Thank you for entrusting me with your care and for choosing [facility], Dr. Elspeth Naval   _______________________________________________________  If your blood pressure at your visit was 140/90 or greater, please contact your primary care physician to follow up on this.  _______________________________________________________  If you are age 74 or older, your body mass index should be between 23-30. Your Body mass index is 24.29 kg/m. If this is out of the aforementioned range listed, please consider follow up with your Primary Care Provider.  If you are age 85 or younger, your body mass index should be between 19-25. Your Body mass index is 24.29 kg/m. If this is out of the aformentioned range listed, please consider follow up with your Primary Care Provider.   ________________________________________________________  The [facility] providers would like to encourage you to use MYCHART to communicate with providers for non-urgent requests or questions.  Due to long hold times on the telephone, sending your provider a message by [facility] may be a faster and more efficient way to get a response.  Please allow 48 business hours for a response.  Please remember that this is for non-urgent requests.  _______________________________________________________  [facility] is using a team-based approach to care.  Your team is made up of your doctor and two to three APPS. Our APPS (Nurse Practitioners and Physician Assistants) work with your physician to ensure care continuity for you. They are fully qualified to address your health concerns and develop a treatment plan. They communicate directly with your gastroenterologist to care for you. Seeing the Advanced Practice Practitioners on your physician's team can help you by facilitating care more promptly, often allowing for earlier appointments, access to diagnostic testing, procedures, and other  specialty referrals.

## 2024-07-14 ENCOUNTER — Encounter: Admitting: Emergency Medicine

## 2024-07-14 DIAGNOSIS — Z48812 Encounter for surgical aftercare following surgery on the circulatory system: Secondary | ICD-10-CM | POA: Diagnosis not present

## 2024-07-14 DIAGNOSIS — Z952 Presence of prosthetic heart valve: Secondary | ICD-10-CM

## 2024-07-14 NOTE — Progress Notes (Signed)
 Daily Session Note  Patient Details  Name: Hannah Willis MRN: [id number] Date of Birth: July 06, 1951 Referring Provider:   Flowsheet Row Cardiac Rehab from 03/29/2024 in [facility] Cardiac and Pulmonary Rehab  Referring Provider Dr. Newman Lawrence    Encounter Date: 07/14/2024  Check In:  Session Check In - 07/14/24 1114       Check-In   Supervising physician immediately available to respond to emergencies See telemetry face sheet for immediately available ER MD    Location [facility]-Cardiac & Pulmonary Rehab    Staff Present Leita Franks RN,BSN;[facility] [place], [place], Exercise Physiologist;Jason Elnor RDN,LDN    Virtual Visit No    Medication changes reported     No    Fall or balance concerns reported    No    Tobacco Cessation No Change    Warm-up and Cool-down Performed on first and last piece of equipment    Resistance Training Performed Yes    VAD Patient? No    PAD/SET Patient? No      Pain Assessment   Currently in Pain? No/denies             Tobacco Use History[1]  Goals Met:  Independence with exercise equipment Exercise tolerated well No report of concerns or symptoms today Strength training completed today  Goals Unmet:  Not Applicable  Comments: Pt able to follow exercise prescription today without complaint.  Will continue to monitor for progression.    Dr. Oneil Pinal is Medical Director for [facility] Cardiac Rehabilitation.  Dr. Fuad Aleskerov is Medical Director for [facility] Pulmonary Rehabilitation.    [1]  Social History Tobacco Use  Smoking Status Former   Current packs/day: 0.00   Types: Cigarettes   Quit date: 06/20/1989   Years since quitting: 35.0  Smokeless Tobacco Never  Tobacco Comments   smoked occasional cigarettes for 5 years

## 2024-07-18 DIAGNOSIS — R001 Bradycardia, unspecified: Secondary | ICD-10-CM

## 2024-07-18 DIAGNOSIS — I480 Paroxysmal atrial fibrillation: Secondary | ICD-10-CM | POA: Diagnosis not present

## 2024-07-18 NOTE — Progress Notes (Signed)
 " Cardiology Office Note:  .   Date:  07/18/2024  ID:  Hannah Willis, DOB Feb 22, 1951, MRN [id number] PCP: Claudene Pellet, MD  [facility] Providers Cardiologist:  Newman Lawrence, MD PCP: Claudene Pellet, MD  Chief Complaint  Patient presents with   PVC     Hannah Willis is a 74 y.o. 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  Shortness of breath is mild.  She is trying to participate in cardiac rehab, but has been noted to have bradycardia episodes during cardiac rehab.  Reviewed recent monitor results with the patient, details below.   Vitals:   07/29/24 1111  BP: 105/72  Pulse: (!) 41  SpO2: 95%          Review of Systems  Cardiovascular:  Positive for chest pain, dyspnea on exertion and palpitations. Negative for leg swelling and syncope.       Slow pulse  Neurological:  Positive for light-headedness.      Studies Reviewed: SABRA        EKG 06/20/2024: Sinus rhythm with frequent Premature ventricular complexes Nonspecific ST and T wave abnormality When compared with ECG of 21-Apr-2024 11:40, ST no longer depressed in Lateral leads T wave inversion no longer evident in Inferior leads Nonspecific T wave abnormality has replaced inverted T waves in Anterolateral leads    Zio patch monitor 13 days 06/20/2024 - 07/04/2024: Dominant rhythm: Sinus. HR 49-132 bpm. Avg HR 68 bpm, in sinus rhythm. 9 episodes of SVT/atrial tachycardia, fastest at 158 bpm for 8 beats, longest for 11 beats at 108 bpm. <1%  isolated SVE, couplet/triplets. 30 episodes of VT, fastest at 182 bpm for 4 beats, longest for 7 beats at 121 bpm. 19.5% isolated VE, 1.1% couplets, <1% triplets. No atrial fibrillation/atrial flutter/high grade AV block, sinus pause >3sec noted. 0 patient triggered events.    Echocardiogram 06/2024:  1. Left ventricular ejection fraction, by estimation, is 55 to 60%. The  left ventricle has normal function. The left ventricle has no regional  wall motion abnormalities. Left ventricular diastolic parameters are  consistent with Grade II diastolic  dysfunction (pseudonormalization).   2. Right ventricular systolic function is normal. The right ventricular  size is normal. There is normal pulmonary artery systolic pressure.   3. 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 No evidence of mitral stenosis. 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   4. The aortic valve has been repaired/replaced. Aortic valve  regurgitation is not visualized. No aortic stenosis is present. There is a  25 mm Edwards Konnect Resilia valve present in the aortic position.  Procedure Date: 02/08/2024.   5. Aortic root/ascending aorta has been repaired/replaced.   6. The inferior vena cava is normal in size with greater than 50%  respiratory variability, suggesting right atrial pressure of 3 mmHg.   Comparison(s): LVEF improved from prior.   Intraoperative TEE 12/2023: POST-OP IMPRESSIONS  _ Left Ventricle: has low normal systolic function, estimated at 45-50%.  _ Right Ventricle: normal function.  _ Aorta: A graft was placed in  the ascending aorta for repair.  _ Left Atrial Appendage: The left atrial appendage appears unchanged from pre-bypass.  _ Aortic Valve: Status post bioprosthetic aortic valve replacement, with  Hannah Willis function of the prosthesis. No paravalvular or intravalvular leak  visualized.  Trans-aortic valvular mean gradient .  _ Mitral Valve: The mitral valve appears unchanged from pre-bypass. There is moderate regurgitation.  _ Tricuspid Valve: The tricuspid valve appears unchanged from pre-bypass. There is mild regurgitation.  _ Pulmonic Valve: The pulmonic valve appears unchanged from pre-bypass.  _ Interatrial Septum: The interatrial septum appears unchanged from  pre-bypass.  _ Pericardium: The pericardium appears unchanged from pre-bypass.  _ Comments: No significant pleural or pericardial effusion.   CTA aorta 01/2024: 1. 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 2. No effacement of the sino-tubular junction and mildly ectatic but nonaneurysmal ascending aorta with normal transverse and descending thoracic aorta diameters. 3. Aortic and coronary artery atherosclerotic vascular calcifications. 4. Four vessel arch anatomy. The left vertebral artery arises directly from the aorta. 5. Cardiomegaly with prominent left atrial enlargement and mild left ventricular dilatation. 6. Nonobstructing left lower pole nephrolithiasis. 7. Multilevel degenerative disc disease.    Coronary angiography 12/25/2023: LM: Normal LAD: Prox 30% , distal 40% disease Ramus: Small, no significant disease Lcx: No significant disease RCA: Large, dominant          RPLA focal 40% disease   LVEDP 30 mmHg   Right heart catheterization 12/25/2023: RA: 7 mmHg RV: 27/3 mmHg PA: 30/15 mmHg, mPAP 16 mmHg PCW: 12 mmHg   AO sats: 97% PA sats: 69%   CO: 3.9 L/min CI: 2.3 L/min/m2   Mild nonobstructive coronary artery disease Elevated LVEDP in the setting of severe aortic regurgitation and reduced LVEF Refer to cardiothoracic surgery for consideration for aortic valve and possible aortic root replacement    Labs 04/2024: Chol 165, TG 86, HDL 74, LDL 75 Hb 12 Cr 0.62 ProBNP 621   Labs 03/2024: Cr 0.73  Labs 02/2024: Hb  12.0. Cr 1.01, Na 133  11/2023: Chol 126, TG 49, HDL 65, LDL 50 HbA1C 5% Hb 13.5, WBC 19k Cr 0.55, T.bili 1.8 TSH 0.8   Physical Exam Vitals and nursing note reviewed.  Constitutional:      General: She is not in acute distress. Neck:     Vascular: No JVD.  Cardiovascular:     Rate and Rhythm: Normal rate. Rhythm irregular. Frequent Extrasystoles are present.    Heart sounds: Murmur heard.     High-pitched blowing holosystolic murmur is present with a grade of 2/6 at the apex.  Pulmonary:     Effort: Pulmonary effort is normal.     Breath sounds: Normal breath sounds. No wheezing or rales.  Musculoskeletal:     Right lower leg: No edema.     Left lower leg: No edema.      VISIT DIAGNOSES:   ICD-10-CM   1. NSVT (nonsustained ventricular tachycardia) (HCC)  I47.29 Ambulatory referral to Cardiac Electrophysiology    2. 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 PAF (paroxysmal atrial fibrillation) (HCC)  I48.0     4. Mixed hyperlipidemia  E78.2             Hannah Willis is a 74 y.o. 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  However, 19% PVCs with episodes of NSVT noted.  I suspect her symptoms are related to PVCs. Increase metoprolol  succinate to 100 mg daily. Her perceived bradycardia is likely due to PVCs and should improve with better PVC control. Given her high PVC percentage and symptoms, I will also refer her to EP for consideration of antiarrhythmic therapy, possibly addition of flecainide. Continue Eliquis  5 mg  twice daily.  Aortic regurgitation, aortic root aneurysm: Now s/p Biological Bentall Procedure using a 25 mm Edwards KONECT RESILIA pericardial valved conduit. Reimplantation of left and right coronary arteries (02/2024). EF remained mildly reduced on echocardiogram in 03/2024. As I increased her metoprolol  succinate, I will discontinue Jardiance  to compensate for any further drop in her blood pressure.  Continue losartan .  Mixed hyperlipidemia: Continue Crestor  5 mg daily.  Lipids well-controlled.  F/u in 6 months  Signed, Newman JINNY Lawrence, MD  "

## 2024-07-19 ENCOUNTER — Encounter

## 2024-07-19 DIAGNOSIS — Z48812 Encounter for surgical aftercare following surgery on the circulatory system: Secondary | ICD-10-CM | POA: Diagnosis not present

## 2024-07-19 DIAGNOSIS — Z952 Presence of prosthetic heart valve: Secondary | ICD-10-CM

## 2024-07-19 NOTE — Progress Notes (Signed)
 Daily Session Note  Patient Details  Name: Hannah Willis MRN: [id number] Date of Birth: 04-26-1951 Referring Provider:   Flowsheet Row Cardiac Rehab from 03/29/2024 in [facility] Cardiac and Pulmonary Rehab  Referring Provider Dr. Newman Lawrence    Encounter Date: 07/19/2024  Check In:  Session Check In - 07/19/24 1055       Check-In   Supervising physician immediately available to respond to emergencies See telemetry face sheet for immediately available ER MD    Location [facility]-Cardiac & Pulmonary Rehab    Staff Present Burnard Davenport RN,BSN,MPA;Maxon Conetta BS, Exercise Physiologist;Margaret Best, MS, Exercise Physiologist;Meredith Tressa RN,BSN;Jason Elnor RDN,LDN    Virtual Visit No    Medication changes reported     No    Fall or balance concerns reported    No    Tobacco Cessation No Change    Warm-up and Cool-down Performed on first and last piece of equipment    Resistance Training Performed Yes    VAD Patient? No    PAD/SET Patient? No      Pain Assessment   Currently in Pain? No/denies             Tobacco Use History[1]  Goals Met:  Proper associated with RPD/PD & O2 Sat Independence with exercise equipment Exercise tolerated well No report of concerns or symptoms today Strength training completed today  Goals Unmet:  Not Applicable  Comments: Pt able to follow exercise prescription today without complaint.  Will continue to monitor for progression.    Dr. Oneil Pinal is Medical Director for [facility] Cardiac Rehabilitation.  Dr. Fuad Aleskerov is Medical Director for [facility] Pulmonary Rehabilitation.    [1]  Social History Tobacco Use  Smoking Status Former   Current packs/day: 0.00   Types: Cigarettes   Quit date: 06/20/1989   Years since quitting: 35.1  Smokeless Tobacco Never  Tobacco Comments   smoked occasional cigarettes for 5 years

## 2024-07-20 DIAGNOSIS — Z952 Presence of prosthetic heart valve: Secondary | ICD-10-CM

## 2024-07-20 NOTE — Progress Notes (Signed)
 Cardiac Individual Treatment Plan  Patient Details  Name: Hannah Willis MRN: [id number] Date of Birth: 1950/08/09 Referring Provider:   Flowsheet Row Cardiac Rehab from 03/29/2024 in [facility] Cardiac and Pulmonary Rehab  Referring Provider Dr. Newman Lawrence    Initial Encounter Date:  Flowsheet Row Cardiac Rehab from 03/29/2024 in [facility] Cardiac and Pulmonary Rehab  Date 03/29/24    Visit Diagnosis: S/P aortic valve replacement  S/P TAVR (transcatheter aortic valve replacement)  Patient's Home Medications on Admission: Current Medications[1]  Past Medical History: Past Medical History:  Diagnosis Date   Allergy    Apnea, sleep    no c-pap machine   Arthritis    OA   Basal cell carcinoma    on front chest   Blood in stool    LAST 2 MONTHS, MEDS CHNAGED FOR   GERD (gastroesophageal reflux disease)    Heart murmur    HFrEF (heart failure with reduced ejection fraction) (HCC) 01/20/2024   Hyperlipidemia    Hypertension    Kidney stones    Laryngitis    started monday night   Neuromuscular disorder (HCC)    Post-operative nausea and vomiting    Severe aortic insufficiency    Severe aortic regurgitation 12/21/2023   Spherocytosis (familial)    Stroke (HCC) 09/21/2007   Synovitis of right hand 12/29/2015   and wrist   Thoracic aortic aneurysm    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8  40.3  35.7  33.3  32.2  28.5  -  Bicarbonate 20.0 - 28.0 mmol/L - 23.7  23.9  22.0  - 21.4  22.8  21.6  22.3  23.3  23.3  21.4  -  TCO2 22 - 32 mmol/L - 25  25  23   - 23  24  23  23  26  27  24  26  26  27  24  22  22  22   -  Acid-base deficit 0.0 - 2.0 mmol/L - 1.0  1.0  2.0  - 3.0  2.0  4.0  2.0  1.0  2.0  -  O2 Saturation % - 66  72  97  - 97  97  97  100  100  72  100  -    Details       Multiple values from one day are sorted in reverse-chronological order          Exercise Target Goals: Exercise Program Goal: Individual exercise prescription set using results from initial 6 min walk test and THRR while considering  patients activity barriers and safety.  Exercise Prescription Goal: Initial exercise prescription builds to 30-45 minutes a day of aerobic activity, 2-3 days per week.  Home exercise guidelines will be given to patient during program as part of exercise prescription that the participant will acknowledge.   Education: Aerobic Exercise: - Group verbal and visual presentation on the components of exercise prescription. Introduces F.I.T.T principle from ACSM for exercise prescriptions.  Reviews F.I.T.T. principles of aerobic exercise including progression. Written material provided at class time.   Education: Resistance Exercise: - Group verbal and visual presentation on the components of exercise prescription. Introduces F.I.T.T principle from ACSM for exercise prescriptions  Reviews F.I.T.T. principles of resistance exercise including progression. Written material provided at class time.    Education: Exercise & Equipment Safety: - Individual verbal instruction and demonstration of equipment use and safety with use of the equipment. Flowsheet Row Cardiac Rehab from 03/29/2024 in [facility] Cardiac and Pulmonary Rehab  Date 03/29/24  Educator MJC  Instruction Review Code 1- Verbalizes Understanding    Education: Exercise Physiology & General Exercise  Guidelines: - Group verbal and written instruction with models to review the exercise physiology of the cardiovascular system and associated critical values. Provides general exercise guidelines with specific guidelines to those with heart or lung disease. Written material provided at class time.   Education: Flexibility, Balance, Mind/Body Relaxation: - Group verbal and visual presentation with interactive activity on the components of exercise prescription. Introduces F.I.T.T principle from ACSM for exercise prescriptions. Reviews F.I.T.T. principles of flexibility and balance exercise training including progression. Also discusses the mind body connection.  Reviews various relaxation techniques to help reduce and manage stress (i.e. Deep breathing, progressive muscle relaxation, and visualization). Balance handout provided to take home. Written material provided at class time.   Activity Barriers & Risk Stratification:  Activity Barriers & Cardiac Risk Stratification - 03/29/24 1506       Activity Barriers & Cardiac Risk Stratification   Activity Barriers Left Knee Replacement;Right Knee Replacement;Joint Problems    Cardiac Risk Stratification Moderate          6 Minute Walk:  6 Minute Walk     Row Name 03/29/24 1504         6 Minute Walk   Phase Initial     Distance 1135 feet     Walk Time 6 minutes     # of Rest Breaks 0     MPH 2.14     METS 2.46     RPE 11     Perceived Dyspnea  1     VO2 Peak 8.6     Symptoms No     Resting HR 60 bpm     Resting BP 126/68     Resting Oxygen Saturation  90 %     Exercise Oxygen Saturation  during 6 min walk 90 %     Max Ex. HR 83 bpm     Max Ex. BP 148/64     2 Minute Post BP 124/62        Oxygen Initial Assessment:   Oxygen Re-Evaluation:   Oxygen Discharge (Final Oxygen Re-Evaluation):   Initial Exercise Prescription:  Initial Exercise Prescription - 03/29/24 1500       Date of Initial Exercise RX and Referring  Provider   Date 03/29/24    Referring Provider Dr. Newman Lawrence      Oxygen   Maintain Oxygen Saturation 88% or higher      Treadmill   MPH 2.2    Grade  0    Minutes 15    METs 2.68      Recumbant Bike   Level 2    RPM 50    Watts 25    Minutes 15    METs 2.46      NuStep   Level 2    SPM 80    Minutes 15    METs 2.46      T5 Nustep   Level 2    SPM 80    Minutes 15    METs 2.46      Biostep-RELP   Level 2    SPM 50    Minutes 15    METs 2.46      Track   Laps 30    Minutes 15    METs 2.63      Prescription Details   Duration Progress to 30 minutes of continuous aerobic without signs/symptoms of physical distress      Intensity   THRR 40-80% of Max Heartrate 94129    Ratings of Perceived Exertion 11-13    Perceived Dyspnea 0-4      Progression   Progression Continue to progress workloads to maintain intensity without signs/symptoms of physical distress.      Resistance Training   Training Prescription Yes    Weight 2lb    Reps 10-15          Perform Capillary Blood Glucose checks as needed.  Exercise Prescription Changes:   Exercise Prescription Changes     Row Name 03/29/24 1500 04/13/24 1100 04/29/24 0700 05/12/24 1600 05/24/24 1500     Response to Exercise   Blood Pressure (Admit) 126/68 104/62 118/64 134/78 118/66   Blood Pressure (Exercise) 148/64 130/62 128/70 142/60 138/70   Blood Pressure (Exit) 124/62 106/58 118/62 114/60 98/60   Heart Rate (Admit) 60 bpm 58 bpm 70 bpm 63 bpm 70 bpm   Heart Rate (Exercise) 83 bpm 85 bpm 112 bpm 107 bpm 93 bpm   Heart Rate (Exit) 59 bpm 61 bpm 76 bpm 59 bpm 68 bpm   Oxygen Saturation (Admit) 90 % -- -- -- --   Oxygen Saturation (Exercise) 90 % -- -- -- --   Oxygen Saturation (Exit) 90 % -- -- -- --   Rating of Perceived Exertion (Exercise) 11 13 13 14 13    Perceived Dyspnea (Exercise) 1 -- -- -- --   Symptoms none none none none none   Comments results first 2 weeks of exercise --  -- --   Duration -- Progress to 30 minutes of  aerobic without signs/symptoms of physical distress Continue with 30 min of aerobic exercise without signs/symptoms of physical distress. Continue with 30 min of aerobic exercise without signs/symptoms of physical distress. Continue with 30 min of aerobic exercise without signs/symptoms of physical distress.   Intensity -- THRR unchanged THRR unchanged THRR unchanged THRR unchanged     Progression   Progression -- Continue to progress workloads to maintain intensity without signs/symptoms of physical distress. Continue to progress workloads to maintain intensity without signs/symptoms of physical distress. Continue to progress workloads to maintain intensity without signs/symptoms of physical distress. Continue to progress workloads to maintain intensity without signs/symptoms of physical distress.   Average METs -- 2.79 2.73 2.8 2.68     Resistance Training   Training Prescription -- Yes Yes Yes Yes   Weight -- 2lb 2 lb 2 lb 2 lb   Reps -- 10-15 10-15 10-15 10-15     Interval  Training   Interval Training -- No No No No     Treadmill   MPH -- 2.3 2.4 2.4 2.2   Grade -- 0 0 0 0   Minutes -- 15 15 15 15    METs -- 2.3 2.84 2.84 2.69     Recumbant Bike   Level -- -- -- 2 2   Watts -- -- -- 25 19   Minutes -- -- -- 15 15   METs -- -- -- 3.31 3.33     NuStep   Level -- 3 5  T6: level 3 4 3    Minutes -- 15 15 15 15    METs -- 2.9 3.3  T6: 2.2 METs 3 2.1     T5 Nustep   Level -- -- -- -- 3   Minutes -- -- -- -- 15   METs -- -- -- -- 2     Biostep-RELP   Level -- -- -- 3 2   Minutes -- -- -- 15 15   METs -- -- -- 2 3     Oxygen   Maintain Oxygen Saturation -- 88% or higher 88% or higher 88% or higher 88% or higher    Row Name 06/07/24 1500 06/21/24 1400 07/11/24 0900 07/19/24 1400       Response to Exercise   Blood Pressure (Admit) 112/70 96/68 114/72 102/62    Blood Pressure (Exit) 114/62 102/62 98/62 104/62    Heart Rate  (Admit) 59 bpm 63 bpm 72 bpm 69 bpm    Heart Rate (Exercise) 83 bpm 78 bpm 94 bpm 93 bpm    Heart Rate (Exit) 67 bpm 72 bpm 79 bpm 48 bpm    Oxygen Saturation (Admit) -- -- 91 % 95 %    Oxygen Saturation (Exercise) -- -- 91 % 88 %    Oxygen Saturation (Exit) -- -- 94 % 97 %    Rating of Perceived Exertion (Exercise) 13 13 13 13     Symptoms none none none none    Duration Continue with 30 min of aerobic exercise without signs/symptoms of physical distress. Continue with 30 min of aerobic exercise without signs/symptoms of physical distress. Continue with 30 min of aerobic exercise without signs/symptoms of physical distress. Continue with 30 min of aerobic exercise without signs/symptoms of physical distress.    Intensity THRR unchanged THRR unchanged THRR unchanged THRR unchanged      Progression   Progression Continue to progress workloads to maintain intensity without signs/symptoms of physical distress. Continue to progress workloads to maintain intensity without signs/symptoms of physical distress. Continue to progress workloads to maintain intensity without signs/symptoms of physical distress. Continue to progress workloads to maintain intensity without signs/symptoms of physical distress.    Average METs 2.81 2.7 2.84 3.4      Resistance Training   Training Prescription Yes -- Yes --    Weight 2 lb 2 lb 2 lb 2 lb    Reps 10-15 10-15 10-15 10-15      Interval Training   Interval Training No No No No      Treadmill   MPH 2.2 2.2 2.1 2.2    Grade 1 0 4 7    Minutes 15 15 15 15     METs 2.99 2.69 3.77 4.81      Recumbant Bike   Level 3 -- 3 --    Watts 29 -- 20 --    Minutes 15 -- 15 --    METs 3.53 -- 3.05 --  NuStep   Level -- -- -- 3    Minutes -- -- -- 15      T5 Nustep   Level 2 3 3 3     Minutes 15 15 15 15     METs 2.4 2.1 2.2 2      Biostep-RELP   Level 3 2 -- --    Minutes 15 15 -- --    METs 2 3 -- --      Oxygen   Maintain Oxygen Saturation 88% or  higher 88% or higher 88% or higher 88% or higher       Exercise Comments:   Exercise Comments     Row Name 04/05/24 1111           Exercise Comments First full day of exercise!  Patient was oriented to gym and equipment including functions, settings, policies, and procedures.  Patient's individual exercise prescription and treatment plan were reviewed.  All starting workloads were established based on the results of the 6 minute walk test done at initial orientation visit.  The plan for exercise progression was also introduced and progression will be customized based on patient's performance and goals.          Exercise Goals and Review:   Exercise Goals     Row Name 03/29/24 1510             Exercise Goals   Increase Physical Activity Yes       Intervention Provide advice, education, support and counseling about physical activity/exercise needs.;Develop an individualized exercise prescription for aerobic and resistive training based on initial evaluation findings, risk stratification, comorbidities and participant's personal goals.       Expected Outcomes Long Term: Exercising regularly at least 3-5 days a week.;Long Term: Add in home exercise to make exercise part of routine and to increase amount of physical activity.;Short Term: Attend rehab on a regular basis to increase amount of physical activity.       Increase Strength and Stamina Yes       Intervention Provide advice, education, support and counseling about physical activity/exercise needs.;Develop an individualized exercise prescription for aerobic and resistive training based on initial evaluation findings, risk stratification, comorbidities and participant's personal goals.       Expected Outcomes Short Term: Increase workloads from initial exercise prescription for resistance, speed, and METs.;Short Term: Perform resistance training exercises routinely during rehab and add in resistance training at home;Long Term:  Improve cardiorespiratory fitness, muscular endurance and strength as measured by increased METs and functional capacity ( )       Able to understand and use rate of perceived exertion (RPE) scale Yes       Intervention Provide education and explanation on how to use RPE scale       Expected Outcomes Long Term:  Able to use RPE to guide intensity level when exercising independently;Short Term: Able to use RPE daily in rehab to express subjective intensity level       Able to understand and use Dyspnea scale Yes       Intervention Provide education and explanation on how to use Dyspnea scale       Expected Outcomes Short Term: Able to use Dyspnea scale daily in rehab to express subjective sense of shortness of breath during exertion;Long Term: Able to use Dyspnea scale to guide intensity level when exercising independently       Knowledge and understanding of Target Heart Rate Range (THRR) Yes  Intervention Provide education and explanation of THRR including how the numbers were predicted and where they are located for reference       Expected Outcomes Short Term: Able to state/look up THRR;Short Term: Able to use daily as guideline for intensity in rehab;Long Term: Able to use THRR to govern intensity when exercising independently       Able to check pulse independently Yes       Intervention Provide education and demonstration on how to check pulse in carotid and radial arteries.;Review the importance of being able to check your own pulse for safety during independent exercise       Expected Outcomes Short Term: Able to explain why pulse checking is important during independent exercise;Long Term: Able to check pulse independently and accurately       Understanding of Exercise Prescription Yes       Intervention Provide education, explanation, and written materials on patient's individual exercise prescription       Expected Outcomes Short Term: Able to explain program exercise  prescription;Long Term: Able to explain home exercise prescription to exercise independently          Exercise Goals Re-Evaluation :  Exercise Goals Re-Evaluation     Row Name 04/05/24 1111 04/13/24 1143 04/29/24 0750 05/12/24 1636 05/24/24 1508     Exercise Goal Re-Evaluation   Exercise Goals Review Increase Physical Activity;Able to understand and use rate of perceived exertion (RPE) scale;Knowledge and understanding of Target Heart Rate Range (THRR);Understanding of Exercise Prescription;Increase Strength and Stamina;Able to understand and use Dyspnea scale;Able to check pulse independently Increase Physical Activity;Increase Strength and Stamina;Understanding of Exercise Prescription Increase Physical Activity;Increase Strength and Stamina;Understanding of Exercise Prescription Increase Physical Activity;Increase Strength and Stamina;Understanding of Exercise Prescription Increase Physical Activity;Increase Strength and Stamina;Understanding of Exercise Prescription   Comments Reviewed RPE and dyspnea scale, THR and program prescription with pt today.  Pt voiced understanding and was given a copy of goals to take home. Hannah Willis is off to a good start in the program, and was able to attend her first two sessions during this review period. During these sessions she was able to increase her treadmill workload to 2. and no incline, and increase to level 3 on the T4 nustep. We will continue to monitor her progress in the program. Hannah Willis is doing well in rehab. She recently increased her speed on the treadmill to 2.4 mph with no incline. She also improved to level 5 on the T4 nustep and level 3 on the T6 nustep. We will continue to monitor her progress in the program. Hannah Willis continues to do well in the program. She has been able to increase from level 2 to 3 on the biostep. She was also able to maintain her treadmill workload at 2. and no incline. We will continue to monitor her progress in the  program. Hannah Willis is doing well in rehab. She recently improved to level 3 on the T5 nustep. She also decreased her speed on the treadmill from 2.4 mph to 2.2 mph on the treadmill. We will continue to monitor her progress in the program.   Expected Outcomes Short: Use RPE daily to regulate intensity. Long: Follow program prescription in THR. Short: Continue to follow exercise prescription. Long: Continue exercise to improve strength and stamina. Short: Continue to progressively increase workloads. Long: Continue exercise to improve strength and stamina. Short: Increase treadmill incline to 1%. Long: Continue exercise to improve strength and stamina. Short: Increase treadmill speed back up to 2.4 mph.  Long: Continue exercise to improve strength and stamina.    Row Name 06/07/24 1120 06/07/24 1502 06/21/24 1410 07/05/24 1117 07/11/24 0953     Exercise Goal Re-Evaluation   Exercise Goals Review Increase Physical Activity;Increase Strength and Stamina;Understanding of Exercise Prescription Increase Physical Activity;Increase Strength and Stamina;Understanding of Exercise Prescription Increase Physical Activity;Increase Strength and Stamina;Understanding of Exercise Prescription Increase Physical Activity;Increase Strength and Stamina;Understanding of Exercise Prescription Increase Physical Activity;Increase Strength and Stamina;Understanding of Exercise Prescription   Comments Hannah Willis is doing well with her exercise. She has been using the recumbent bike and treadmill that she owns at her home on days when she is away from rehab. She states that she is beginning to notice some of the benefits of exercise. We will continue to monitor her progress in the program. Hannah Willis continues to do well in rehab. She was able to increase her workload on the treadmill to a speed of 2.2 mph and 1% incline. She also increased to level 3 on the biostep and level 3 on the recumbent bike with an average of 29 watts. We will continue  to monitor her progress in the program. Hannah Willis is doing well in the program. She continues to walk at 3 mph on the treadmill but did not have any incline during the last review period. She did improve back up to to level 3 on the T5 nustep. We will continue to monitor her progress in the program. Hannah Willis is doing well here at rehab. At home she is using treadmill and exercise bike to exercise independently. Hannah Willis continues to do well in rehab. She increased to a speed of 2.1 mph on the treadmill with an incline of 4%. She maintained level 3 on the recumbent bike and T5 nustep. We will continue to monitor her progress in the program.   Expected Outcomes Short: Continue to exercise on days away from rehab. Long: Continue exercise to improve strength and stamina. Short: Continue to progressively increase treadmill workload. Long: Continue exercise to improve strength and stamina. Short: Add incline back to treadmill workload. Long: Continue exercise to improve strength and stamina. STG: Increase workload on treadmill here at rehab. LTG: Continue exercise to improve strength and stamina. Short: Continue to progressively increase treadmill and recumbent bike workloads. Long: Continue exercise to improve strength and stamina.    Row Name 07/19/24 1501             Exercise Goal Re-Evaluation   Exercise Goals Review Increase Physical Activity;Increase Strength and Stamina;Understanding of Exercise Prescription       Comments Hannah Willis is doing well in rehab. She was recently able to increase her treadmill workload to a speed of 2. and 7% incline. She was also able to maintain level 3 on both the T6 and T5 nustep. We will continue to monitor her progress in the program.       Expected Outcomes Short: Continue to increase treadmill workload. Long: Continue exercise to improve strength and stamina.          Discharge Exercise Prescription (Final Exercise Prescription Changes):  Exercise Prescription  Changes - 07/19/24 1400       Response to Exercise   Blood Pressure (Admit) 102/62    Blood Pressure (Exit) 104/62    Heart Rate (Admit) 69 bpm    Heart Rate (Exercise) 93 bpm    Heart Rate (Exit) 48 bpm    Oxygen Saturation (Admit) 95 %    Oxygen Saturation (Exercise) 88 %    Oxygen Saturation (Exit)  97 %    Rating of Perceived Exertion (Exercise) 13    Symptoms none    Duration Continue with 30 min of aerobic exercise without signs/symptoms of physical distress.    Intensity THRR unchanged      Progression   Progression Continue to progress workloads to maintain intensity without signs/symptoms of physical distress.    Average METs 3.4      Resistance Training   Weight 2 lb    Reps 10-15      Interval Training   Interval Training No      Treadmill   MPH 2.2    Grade 7    Minutes 15    METs 4.81      NuStep   Level 3    Minutes 15      T5 Nustep   Level 3    Minutes 15    METs 2      Oxygen   Maintain Oxygen Saturation 88% or higher          Nutrition:  Target Goals: Understanding of nutrition guidelines, daily intake of sodium 1500mg , cholesterol 200mg , calories 30% from fat and 7% or less from saturated fats, daily to have 5 or more servings of fruits and vegetables.  Education: Nutrition 1 -Group instruction provided by verbal, written material, interactive activities, discussions, models, and posters to present general guidelines for heart healthy nutrition including macronutrients, label reading, and promoting whole foods over processed counterparts. Education serves as pensions consultant of discussion of heart healthy eating for all. Written material provided at class time.    Education: Nutrition 2 -Group instruction provided by verbal, written material, interactive activities, discussions, models, and posters to present general guidelines for heart healthy nutrition including sodium, cholesterol, and saturated fat. Providing guidance of habit forming to  improve blood pressure, cholesterol, and body weight. Written material provided at class time.     Biometrics:  Pre Biometrics - 03/29/24 1510       Pre Biometrics   Height 5' 2.6 (1.59 m)    Weight 135 lb 11.2 oz (61.6 kg)    Waist Circumference 32 inches    Hip Circumference 35.5 inches    Waist to Hip Ratio 0.9 %    BMI (Calculated) 24.35    Single Leg Stand 7 seconds           Nutrition Therapy Plan and Nutrition Goals:   Nutrition Assessments:  MEDIFICTS Score Key: >=70 Need to make dietary changes  40-70 Heart Healthy Diet <= 40 Therapeutic Level Cholesterol Diet   Picture Your Plate Scores: <59 Unhealthy dietary pattern with much room for improvement. 41-50 Dietary pattern unlikely to meet recommendations for good health and room for improvement. 51-60 More healthful dietary pattern, with some room for improvement.  >60 Healthy dietary pattern, although there may be some specific behaviors that could be improved.    Nutrition Goals Re-Evaluation:  Nutrition Goals Re-Evaluation     Row Name 05/10/24 1106 06/07/24 1124 07/05/24 1121         Goals   Nutrition Goal Pt has deferred to meet with the RD at this time. Pt has deferred to meet with the RD at this time. Pt has deferred to meet with the RD at this time.        Nutrition Goals Discharge (Final Nutrition Goals Re-Evaluation):  Nutrition Goals Re-Evaluation - 07/05/24 1121       Goals   Nutrition Goal Pt has deferred to meet with the RD at  this time.          Psychosocial: Target Goals: Acknowledge presence or absence of significant depression and/or stress, maximize coping skills, provide positive support system. Participant is able to verbalize types and ability to use techniques and skills needed for reducing stress and depression.   Education: Stress, Anxiety, and Depression - Group verbal and visual presentation to define topics covered.  Reviews how body is impacted by stress,  anxiety, and depression.  Also discusses healthy ways to reduce stress and to treat/manage anxiety and depression. Written material provided at class time.   Education: Sleep Hygiene -Provides group verbal and written instruction about how sleep can affect your health.  Define sleep hygiene, discuss sleep cycles and impact of sleep habits. Review good sleep hygiene tips.   Initial Review & Psychosocial Screening:  Initial Psych Review & Screening - 03/23/24 1343       Initial Review   Current issues with None Identified      Family Dynamics   Good Support System? Yes      Barriers   Psychosocial barriers to participate in program There are no identifiable barriers or psychosocial needs.;The patient should benefit from training in stress management and relaxation.      Screening Interventions   Interventions Encouraged to exercise;Provide feedback about the scores to participant;To provide support and resources with identified psychosocial needs    Expected Outcomes Long Term Goal: Stressors or current issues are controlled or eliminated.;Short Term goal: Utilizing psychosocial counselor, staff and physician to assist with identification of specific Stressors or current issues interfering with healing process. Setting desired goal for each stressor or current issue identified.;Short Term goal: Identification and review with participant of any Quality of Life or Depression concerns found by scoring the questionnaire.;Long Term goal: The participant improves quality of Life and PHQ9 Scores as seen by post scores and/or verbalization of changes          Quality of Life Scores:   Scores of 19 and below usually indicate a poorer quality of life in these areas.  A difference of  2-3 points is a clinically meaningful difference.  A difference of 2-3 points in the total score of the Quality of Life Index has been associated with significant improvement in overall quality of life, self-image,  physical symptoms, and general health in studies assessing change in quality of life.  PHQ-9: Review Flowsheet       03/29/2024  Depression screen PHQ 2/9  Decreased Interest 0  Down, Depressed, Hopeless 0  PHQ - 2 Score 0  Altered sleeping 2  Tired, decreased energy 1  Change in appetite 0  Feeling bad or failure about yourself  0  Trouble concentrating 0  Moving slowly or fidgety/restless 0  Suicidal thoughts 0  PHQ-9 Score 3   Difficult doing work/chores Not difficult at all    Details       Data saved with a previous flowsheet row definition        Interpretation of Total Score  Total Score Depression Severity:  1-4 = Minimal depression, 5-9 = Mild depression, 10-14 = Moderate depression, 15-19 = Moderately severe depression, 20-27 = Severe depression   Psychosocial Evaluation and Intervention:  Psychosocial Evaluation - 03/23/24 1352       Psychosocial Evaluation & Interventions   Interventions Encouraged to exercise with the program and follow exercise prescription    Comments Ms. 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 She states she feels like  she has been recovering well besides being dizzy at times. They are working with her blood pressure medications to hopefully manage it better. She enjoys spending time with her horse, dog, and cat as well as staying active. She rides her recumbent bike 5 days a week. When she feels stressed, she takes some B1 and then focuses on doing positive things. She reports no current concerns    Expected Outcomes Short: attend cardiac rehab for education and exercise Long: develop and maitnain positive self care habits.    Continue Psychosocial Services  Follow up required by staff          Psychosocial Re-Evaluation:  Psychosocial Re-Evaluation     Row Name 05/10/24 1103 06/07/24 1122 07/05/24 1119         Psychosocial Re-Evaluation   Current issues with None Identified None Identified None Identified      Comments Hannah Willis reports no major stressors at this time. She states that when she deals with the daily stress of life she enjoys burning trash and sewing. She also states that exercise has been a good stress reliever for her. She is sleeping well at this time. She has a good support system made up by her husband, animals, neighbors and friends. Hannah Willis reports no major stressors at this time. She states that when she deals with the daily stress of life she enjoys burning trash and sewing. She also enjoys crafts where she gets to use her hands. She states that exercise has been a good stress reliever for her. She continues to sleep well at this time. She has a good support system made up by her husband, animals, neighbors and friends. Hannah Willis denies any stress, anxiety, or depression. She says she sleeps ~7hrs a night. Reports she has a good support system.     Expected Outcomes Short: Continue to relieve stress through exercise. Long: Maintain positive outlook. Short: Continue to relieve stress through exercise. Long: Maintain positive outlook. STG: Continue to exercise for stress relief. LTG: Maintain positive outlook on daily life and health.     Interventions Encouraged to attend Cardiac Rehabilitation for the exercise Encouraged to attend Cardiac Rehabilitation for the exercise Encouraged to attend Cardiac Rehabilitation for the exercise     Continue Psychosocial Services  Follow up required by staff Follow up required by staff Follow up required by staff        Psychosocial Discharge (Final Psychosocial Re-Evaluation):  Psychosocial Re-Evaluation - 07/05/24 1119       Psychosocial Re-Evaluation   Current issues with None Identified    Comments Hannah Willis denies any stress, anxiety, or depression. She says she sleeps ~7hrs a night. Reports she has a good support system.    Expected Outcomes STG: Continue to exercise for stress relief. LTG: Maintain positive outlook on daily life and health.     Interventions Encouraged to attend Cardiac Rehabilitation for the exercise    Continue Psychosocial Services  Follow up required by staff          Vocational Rehabilitation: Provide vocational rehab assistance to qualifying candidates.   Vocational Rehab Evaluation & Intervention:  Vocational Rehab - 03/23/24 1342       Initial Vocational Rehab Evaluation & Intervention   Assessment shows need for Vocational Rehabilitation No          Education: Education Goals: Education classes will be provided on a variety of topics geared toward better understanding of heart health and risk factor modification. Participant will state understanding/return demonstration of topics presented  as noted by education test scores.  Learning Barriers/Preferences:  Learning Barriers/Preferences - 03/23/24 1342       Learning Barriers/Preferences   Learning Barriers None    Learning Preferences None          General Cardiac Education Topics:  AED/CPR: - Group verbal and written instruction with the use of models to demonstrate the basic use of the AED with the basic ABC's of resuscitation.   Test and Procedures: - Group verbal and visual presentation and models provide information about basic cardiac anatomy and function. Reviews the testing methods done to diagnose heart disease and the outcomes of the test results. Describes the treatment choices: Medical Management, Angioplasty, or Coronary Bypass Surgery for treating various heart conditions including Myocardial Infarction, Angina, Valve Disease, and Cardiac Arrhythmias. Written material provided at class time.   Medication Safety: - Group verbal and visual instruction to review commonly prescribed medications for heart and lung disease. Reviews the medication, class of the drug, and side effects. Includes the steps to properly store meds and maintain the prescription regimen. Written material provided at class time.   Intimacy: - Group  verbal instruction through game format to discuss how heart and lung disease can affect sexual intimacy. Written material provided at class time.   Know Your Numbers and Heart Failure: - Group verbal and visual instruction to discuss disease risk factors for cardiac and pulmonary disease and treatment options.  Reviews associated critical values for Overweight/Obesity, Hypertension, Cholesterol, and Diabetes.  Discusses basics of heart failure: signs/symptoms and treatments.  Introduces Heart Failure Zone chart for action plan for heart failure. Written material provided at class time.   Infection Prevention: - Provides verbal and written material to individual with discussion of infection control including proper hand washing and proper equipment cleaning during exercise session. Flowsheet Row Cardiac Rehab from 03/29/2024 in [facility] Cardiac and Pulmonary Rehab  Date 03/29/24  Educator MJC  Instruction Review Code 1- Verbalizes Understanding    Falls Prevention: - Provides verbal and written material to individual with discussion of falls prevention and safety. Flowsheet Row Cardiac Rehab from 03/29/2024 in [facility] Cardiac and Pulmonary Rehab  Date 03/29/24  Educator MJC  Instruction Review Code 1- Verbalizes Understanding    Other: -Provides group and verbal instruction on various topics (see comments)   Knowledge Questionnaire Score:   Core Components/Risk Factors/Patient Goals at Admission:  Personal Goals and Risk Factors at Admission - 03/23/24 1339       Core Components/Risk Factors/Patient Goals on Admission    Weight Management Weight Loss;Yes    Intervention Weight Management: Develop a combined nutrition and exercise program designed to reach desired caloric intake, while maintaining appropriate intake of nutrient and fiber, sodium and fats, and appropriate energy expenditure required for the weight goal.;Weight Management: Provide education and appropriate resources to help  participant work on and attain dietary goals.;Weight Management/Obesity: Establish reasonable short term and long term weight goals.    Goal Weight: Long Term 115 lb (52.2 kg)    Expected Outcomes Short Term: Continue to assess and modify interventions until short term weight is achieved;Long Term: Adherence to nutrition and physical activity/exercise program aimed toward attainment of established weight goal;Weight Loss: Understanding of general recommendations for a balanced deficit meal plan, which promotes 1-2 lb weight loss per week and includes a negative energy balance of [phone] kcal/d;Understanding recommendations for meals to include 15-35% energy as protein, 25-35% energy from fat, 35-60% energy from carbohydrates, less than 200mg  of dietary cholesterol, 20-35  gm of total fiber daily;Understanding of distribution of calorie intake throughout the day with the consumption of 4-5 meals/snacks    Hypertension Yes    Intervention Provide education on lifestyle modifcations including regular physical activity/exercise, weight management, moderate sodium restriction and increased consumption of fresh fruit, vegetables, and low fat dairy, alcohol moderation, and smoking cessation.;Monitor prescription use compliance.    Expected Outcomes Long Term: Maintenance of blood pressure at goal levels.;Short Term: Continued assessment and intervention until BP is < 140/56mm HG in hypertensive participants. < 130/80mm HG in hypertensive participants with diabetes, heart failure or chronic kidney disease.    Lipids Yes    Intervention Provide education and support for participant on nutrition & aerobic/resistive exercise along with prescribed medications to achieve LDL 70mg , HDL >40mg .    Expected Outcomes Short Term: Participant states understanding of desired cholesterol values and is compliant with medications prescribed. Participant is following exercise prescription and nutrition guidelines.;Long Term:  Cholesterol controlled with medications as prescribed, with individualized exercise RX and with personalized nutrition plan. Value goals: LDL < 70mg , HDL > 40 mg.          Education:Diabetes - Individual verbal and written instruction to review signs/symptoms of diabetes, desired ranges of glucose level fasting, after meals and with exercise. Acknowledge that pre and post exercise glucose checks will be done for 3 sessions at entry of program.   Core Components/Risk Factors/Patient Goals Review:   Goals and Risk Factor Review     Row Name 05/10/24 1106 06/07/24 1124 07/05/24 1123         Core Components/Risk Factors/Patient Goals Review   Personal Goals Review Weight Management/Obesity;Hypertension Weight Management/Obesity;Hypertension Weight Management/Obesity;Hypertension     Review Hannah Willis states that she is not currently working on weight loss. She is happy with where her weight is right now as she weighed in today at 130.8 lb. She states that her BP has been within normal ranges at most times. She states her BP was a little higher when she went for an infusion appointment, but it returned to normal quickly. She owns a BP cuff and checks her readings routinely. She continues to take all of her medications as prescribed. Hannah Willis states that she is comfortable with where her weight is at this time. She is not currently working on weight loss, but states that she would be happy if she did lose some weight by way of exercise. She has been checking her BP at home and states states that her BP has been within normal ranges at most times. She states that her BP has been lower sometimes after exercise with systolic readings in the 90s. She also continues to take all of her medications as prescribed. Hannah Willis reports she checks her BP twice daily before taking BP meds. She is happy with her current weight and is mostly focused on strengthening her body.     Expected Outcomes Short: Continue to check  BP at home routinely. Long: Continue to manage lifestyle risk factors. Short: Continue to check BP at home routinely. Long: Continue to manage lifestyle risk factors. STG: CHeck BP and take meds as prescribed. LTG: Continue to manage lifestyle risk factors        Core Components/Risk Factors/Patient Goals at Discharge (Final Review):   Goals and Risk Factor Review - 07/05/24 1123       Core Components/Risk Factors/Patient Goals Review   Personal Goals Review Weight Management/Obesity;Hypertension    Review Hannah Willis reports she checks her BP twice daily before taking  BP meds. She is happy with her current weight and is mostly focused on strengthening her body.    Expected Outcomes STG: CHeck BP and take meds as prescribed. LTG: Continue to manage lifestyle risk factors          ITP Comments:  ITP Comments     Row Name 03/23/24 1356 03/29/24 1504 03/30/24 1153 04/05/24 1111 04/27/24 0956   ITP Comments Initial phone call completed. Diagnosis can be found in [facility] 8/26. EP Orientation scheduled for Tuesday 9/23 at 1:30. Completed and gym orientation for cardiac rehab. Initial ITP created and sent for review to Dr. Oneil Pinal, Medical Director. 30 Day review completed. Medical Director ITP review done; changes made as directed and signed approval by Medical Director. New to program. First full day of exercise!  Patient was oriented to gym and equipment including functions, settings, policies, and procedures.  Patient's individual exercise prescription and treatment plan were reviewed.  All starting workloads were established based on the results of the 6 minute walk test done at initial orientation visit.  The plan for exercise progression was also introduced and progression will be customized based on patient's performance and goals. 30 Day review completed. Medical Director ITP review done, changes made as directed, and signed approval by Medical Director.    Row Name 05/25/24 0935 06/22/24  1339 07/20/24 1127       ITP Comments 30 Day review completed. Medical Director ITP review done, changes made as directed, and signed approval by Medical Director. 30 Day review completed. Medical Director ITP review done, changes made as directed, and signed approval by Medical Director. 30 Day review completed. Medical Director ITP review done, changes made as directed, and signed approval by Medical Director.        Comments: 30 day review     [1]  Current Outpatient Medications:    acetaminophen  (TYLENOL ) 500 MG tablet, Take 1-2 tablets (500-1,000 mg total) by mouth every 6 (six) hours as needed., Disp: , Rfl:    alendronate (FOSAMAX) 70 MG tablet, Take 70 mg by mouth once a week. Take with a full glass of water  on an empty stomach., Disp: , Rfl:    apixaban  (ELIQUIS ) 5 MG TABS tablet, Take 1 tablet (5 mg total) by mouth 2 (two) times daily., Disp: 180 tablet, Rfl: 1   Apoaequorin (PREVAGEN PO), Take 1 tablet by mouth daily., Disp: , Rfl:    calcium  carbonate (OSCAL) 1500 (600 Ca) MG TABS tablet, Take by mouth 2 (two) times daily with a meal., Disp: , Rfl:    Calcium  Carbonate-Vit D-Min (CALCIUM  1200 PO), Take 1 tablet by mouth daily at 6 (six) AM., Disp: , Rfl:    dextromethorphan-guaiFENesin  (MUCINEX  DM) 30-600 MG 12hr tablet, Take 1 tablet by mouth 2 (two) times daily., Disp: , Rfl:    empagliflozin  (JARDIANCE ) 10 MG TABS tablet, Take 1 tablet (10 mg total) by mouth daily before breakfast., Disp: 90 tablet, Rfl: 3   ferrous sulfate  325 (65 FE) MG tablet, Take 325 mg by mouth daily., Disp: , Rfl:    losartan  (COZAAR ) 50 MG tablet, Take 1 tablet (50 mg total) by mouth daily., Disp: 90 tablet, Rfl: 3   metoprolol  succinate (TOPROL -XL) 100 MG 24 hr tablet, Take 0.5 tablets (50 mg total) by mouth daily. Take with or immediately following a meal., Disp: , Rfl:    ondansetron  (ZOFRAN ) 4 MG tablet, Take 1 tablet (4 mg total) by mouth every 8 (eight) hours as needed for  nausea or vomiting.,  Disp: 90 tablet, Rfl: 1   rosuvastatin  (CRESTOR ) 20 MG tablet, Take 20 mg by mouth daily., Disp: , Rfl:    thiamine  (VITAMIN B-1) 100 MG tablet, Take 100 mg by mouth daily., Disp: , Rfl:    traMADol  (ULTRAM ) 50 MG tablet, Take 1 tablet (50 mg total) by mouth every 6 (six) hours as needed for severe pain (pain score 7-10)., Disp: 28 tablet, Rfl: 0   Vedolizumab  (ENTYVIO  IV), Inject 1 Dose as directed every 8 (eight) weeks. Experimental every two months, given at the MD office, Disp: , Rfl:    VITAMIN D  PO, Take 1 tablet by mouth daily., Disp: , Rfl:  [2]  Social History Tobacco Use  Smoking Status Former   Current packs/day: 0.00   Types: Cigarettes   Quit date: 06/20/1989   Years since quitting: 35.1  Smokeless Tobacco Never  Tobacco Comments   smoked occasional cigarettes for 5 years

## 2024-07-21 ENCOUNTER — Encounter

## 2024-07-26 ENCOUNTER — Encounter

## 2024-07-27 ENCOUNTER — Encounter: Admitting: Emergency Medicine

## 2024-07-27 VITALS — Ht 62.6 in | Wt 132.3 lb

## 2024-07-27 DIAGNOSIS — Z48812 Encounter for surgical aftercare following surgery on the circulatory system: Secondary | ICD-10-CM | POA: Diagnosis not present

## 2024-07-27 DIAGNOSIS — Z952 Presence of prosthetic heart valve: Secondary | ICD-10-CM

## 2024-07-27 NOTE — Progress Notes (Signed)
 Daily Session Note  Patient Details  Name: Hannah Willis MRN: [id number] Date of Birth: Nov 30, 1950 Referring Provider:   Flowsheet Row Cardiac Rehab from 03/29/2024 in [facility] Cardiac and Pulmonary Rehab  Referring Provider Dr. Newman Lawrence    Encounter Date: 07/27/2024  Check In:  Session Check In - 07/27/24 1044       Check-In   Supervising physician immediately available to respond to emergencies See telemetry face sheet for immediately available ER MD    Location [facility]-Cardiac & Pulmonary Rehab    Staff Present Leita Franks RN,BSN;[facility] BS, Exercise Physiologist;Margaret Best, MS, Exercise Physiologist    Virtual Visit No    Medication changes reported     No    Fall or balance concerns reported    No    Tobacco Cessation No Change    Warm-up and Cool-down Performed on first and last piece of equipment    Resistance Training Performed Yes    VAD Patient? No    PAD/SET Patient? No      Pain Assessment   Currently in Pain? No/denies             Tobacco Use History[1]  Goals Met:  Independence with exercise equipment Exercise tolerated well No report of concerns or symptoms today Strength training completed today  Goals Unmet:  Not Applicable  Comments: Pt able to follow exercise prescription today without complaint.  Will continue to monitor for progression.   6 Minute Walk     Row Name 03/29/24 1504 07/27/24 1112       6 Minute Walk   Phase Initial Discharge    Distance 1135 feet 1460 feet    Distance % Change -- 28.6 %    Distance Feet Change -- 325 ft    Walk Time 6 minutes 6 minutes    # of Rest Breaks 0 0    MPH 2.14 2.76    METS 2.46 3.02    RPE 11 13    Perceived Dyspnea  1 1    VO2 Peak 8.6 10.6    Symptoms No No    Resting HR 60 bpm 45 bpm    Resting BP 126/68 102/60    Resting Oxygen Saturation  90 % 91 %    Exercise Oxygen Saturation  during 6 min walk 90 % 90 %    Max Ex. HR 83 bpm 81 bpm     Max Ex. BP 148/64 146/76    2 Minute Post BP 124/62 --        Dr. Oneil Pinal is Medical Director for [facility] Cardiac Rehabilitation.  Dr. Fuad Aleskerov is Medical Director for [facility] Pulmonary Rehabilitation.     [1]  Social History Tobacco Use  Smoking Status Former   Current packs/day: 0.00   Types: Cigarettes   Quit date: 06/20/1989   Years since quitting: 35.1  Smokeless Tobacco Never  Tobacco Comments   smoked occasional cigarettes for 5 years

## 2024-07-27 NOTE — Patient Instructions (Signed)
 Discharge Patient Instructions  Patient Details  Name: Hannah Willis MRN: [id number] Date of Birth: 01/21/1951 Referring Provider:  Claudene Pellet, MD   Number of Visits: 9  Reason for Discharge:  Patient reached a stable level of exercise. Patient independent in their exercise. Patient has met program and personal goals.   Diagnosis:  S/P aortic valve replacement  Initial Exercise Prescription:  Initial Exercise Prescription - 03/29/24 1500       Date of Initial Exercise RX and Referring Provider   Date 03/29/24    Referring Provider Dr. Newman Lawrence      Oxygen   Maintain Oxygen Saturation 88% or higher      Treadmill   MPH 2.2    Grade 0    Minutes 15    METs 2.68      Recumbant Bike   Level 2    RPM 50    Watts 25    Minutes 15    METs 2.46      NuStep   Level 2    SPM 80    Minutes 15    METs 2.46      T5 Nustep   Level 2    SPM 80    Minutes 15    METs 2.46      Biostep-RELP   Level 2    SPM 50    Minutes 15    METs 2.46      Track   Laps 30    Minutes 15    METs 2.63      Prescription Details   Duration Progress to 30 minutes of continuous aerobic without signs/symptoms of physical distress      Intensity   THRR 40-80% of Max Heartrate 94129    Ratings of Perceived Exertion 11-13    Perceived Dyspnea 0-4      Progression   Progression Continue to progress workloads to maintain intensity without signs/symptoms of physical distress.      Resistance Training   Training Prescription Yes    Weight 2lb    Reps 10-15          Discharge Exercise Prescription (Final Exercise Prescription Changes):  Exercise Prescription Changes - 07/19/24 1400       Response to Exercise   Blood Pressure (Admit) 102/62    Blood Pressure (Exit) 104/62    Heart Rate (Admit) 69 bpm    Heart Rate (Exercise) 93 bpm    Heart Rate (Exit) 48 bpm    Oxygen Saturation (Admit) 95 %    Oxygen Saturation (Exercise) 88 %    Oxygen  Saturation (Exit) 97 %    Rating of Perceived Exertion (Exercise) 13    Symptoms none    Duration Continue with 30 min of aerobic exercise without signs/symptoms of physical distress.    Intensity THRR unchanged      Progression   Progression Continue to progress workloads to maintain intensity without signs/symptoms of physical distress.    Average METs 3.4      Resistance Training   Weight 2 lb    Reps 10-15      Interval Training   Interval Training No      Treadmill   MPH 2.2    Grade 7    Minutes 15    METs 4.81      NuStep   Level 3    Minutes 15      T5 Nustep   Level 3    Minutes 15  METs 2      Oxygen   Maintain Oxygen Saturation 88% or higher          Functional Capacity:  6 Minute Walk     Row Name 03/29/24 1504 07/27/24 1112       6 Minute Walk   Phase Initial Discharge    Distance 1135 feet 1460 feet    Distance % Change -- 28.6 %    Distance Feet Change -- 325 ft    Walk Time 6 minutes 6 minutes    # of Rest Breaks 0 0    MPH 2.14 2.76    METS 2.46 3.02    RPE 11 13    Perceived Dyspnea  1 1    VO2 Peak 8.6 10.6    Symptoms No No    Resting HR 60 bpm 45 bpm    Resting BP 126/68 102/60    Resting Oxygen Saturation  90 % 91 %    Exercise Oxygen Saturation  during 6 min walk 90 % 90 %    Max Ex. HR 83 bpm 81 bpm    Max Ex. BP 148/64 146/76    2 Minute Post BP 124/62 --      Nutrition & Weight - Outcomes:  Pre Biometrics - 03/29/24 1510       Pre Biometrics   Height 5' 2.6 (1.59 m)    Weight 135 lb 11.2 oz (61.6 kg)    Waist Circumference 32 inches    Hip Circumference 35.5 inches    Waist to Hip Ratio 0.9 %    BMI (Calculated) 24.35    Single Leg Stand 7 seconds          Post Biometrics - 07/27/24 1114        Post  Biometrics   Height 5' 2.6 (1.59 m)    Weight 132 lb 4.8 oz (60 kg)    Waist Circumference 30 inches    Hip Circumference 38 inches    Waist to Hip Ratio 0.79 %    BMI (Calculated) 23.74    Single  Leg Stand 5.5 seconds         Goals reviewed with patient; copy given to patient.

## 2024-07-28 ENCOUNTER — Encounter: Admitting: Emergency Medicine

## 2024-07-28 DIAGNOSIS — Z952 Presence of prosthetic heart valve: Secondary | ICD-10-CM

## 2024-07-28 DIAGNOSIS — Z48812 Encounter for surgical aftercare following surgery on the circulatory system: Secondary | ICD-10-CM | POA: Diagnosis not present

## 2024-07-28 NOTE — Progress Notes (Signed)
 Daily Session Note  Patient Details  Name: Hannah Willis MRN: [id number] Date of Birth: Nov 18, 1950 Referring Provider:   Flowsheet Row Cardiac Rehab from 03/29/2024 in [facility] Cardiac and Pulmonary Rehab  Referring Provider Dr. Newman Lawrence    Encounter Date: 07/28/2024  Check In:  Session Check In - 07/28/24 1112       Check-In   Supervising physician immediately available to respond to emergencies See telemetry face sheet for immediately available ER MD    Location [facility]-Cardiac & Pulmonary Rehab    Staff Present Leita Franks RN,BSN;Joseph [facility] [place], [place], Exercise Physiologist;Jason Elnor RDN,LDN    Virtual Visit No    Medication changes reported     No    Fall or balance concerns reported    No    Tobacco Cessation No Change    Warm-up and Cool-down Performed on first and last piece of equipment    Resistance Training Performed Yes    VAD Patient? No    PAD/SET Patient? No      Pain Assessment   Currently in Pain? No/denies             Tobacco Use History[1]  Goals Met:  Independence with exercise equipment Exercise tolerated well No report of concerns or symptoms today Strength training completed today  Goals Unmet:  Not Applicable  Comments: Pt able to follow exercise prescription today without complaint.  Will continue to monitor for progression.    Dr. Oneil Pinal is Medical Director for [facility] Cardiac Rehabilitation.  Dr. Fuad Aleskerov is Medical Director for [facility] Pulmonary Rehabilitation.    [1]  Social History Tobacco Use  Smoking Status Former   Current packs/day: 0.00   Types: Cigarettes   Quit date: 06/20/1989   Years since quitting: 35.1  Smokeless Tobacco Never  Tobacco Comments   smoked occasional cigarettes for 5 years

## 2024-07-29 ENCOUNTER — Ambulatory Visit: Attending: Cardiology | Admitting: Cardiology

## 2024-07-29 VITALS — BP 105/72 | HR 41 | Ht 62.0 in | Wt 132.0 lb

## 2024-07-29 DIAGNOSIS — I4729 Other ventricular tachycardia: Secondary | ICD-10-CM | POA: Diagnosis not present

## 2024-07-29 DIAGNOSIS — E782 Mixed hyperlipidemia: Secondary | ICD-10-CM | POA: Diagnosis not present

## 2024-07-29 DIAGNOSIS — I480 Paroxysmal atrial fibrillation: Secondary | ICD-10-CM

## 2024-07-29 DIAGNOSIS — I493 Ventricular premature depolarization: Secondary | ICD-10-CM

## 2024-07-29 MED ORDER — METOPROLOL SUCCINATE ER 100 MG PO TB24: 100.0000 mg | ORAL_TABLET | Freq: Every day | ORAL | 3 refills | Status: AC

## 2024-07-29 NOTE — Patient Instructions (Signed)
 Medication Instructions:  STOP Jardiance   INCREASE Metoprolol  to 100 mg daily   *If you need a refill on your cardiac medications before your next appointment, please call your pharmacy*  Referral to Electrophysiology    Follow-Up: At [facility], you and your health needs are our priority.  As part of our continuing mission to provide you with exceptional heart care, our providers are all part of one team.  This team includes your primary Cardiologist (physician) and Advanced Practice Providers or APPs (Physician Assistants and Nurse Practitioners) who all work together to provide you with the care you need, when you need it.  Your next appointment:   6 month(s)  Provider:   Newman JINNY Lawrence, MD

## 2024-07-30 ENCOUNTER — Encounter: Payer: Self-pay | Admitting: Cardiology

## 2024-07-30 DIAGNOSIS — I4729 Other ventricular tachycardia: Secondary | ICD-10-CM | POA: Insufficient documentation

## 2024-08-02 ENCOUNTER — Encounter

## 2024-08-04 ENCOUNTER — Encounter

## 2024-08-09 ENCOUNTER — Encounter

## 2024-08-11 ENCOUNTER — Encounter: Attending: Cardiology | Admitting: Emergency Medicine

## 2024-08-11 DIAGNOSIS — Z952 Presence of prosthetic heart valve: Secondary | ICD-10-CM

## 2024-08-11 NOTE — Progress Notes (Signed)
 Daily Session Note  Patient Details  Name: Hannah Willis MRN: [id number] Date of Birth: [birth year] Referring Provider:   Flowsheet Row Cardiac Rehab from 03/29/2024 in [facility] Cardiac and Pulmonary Rehab  Referring Provider Dr. Newman Lawrence    Encounter Date: 08/11/2024  Check In:  Session Check In - 08/11/24 1120       Check-In   Supervising physician immediately available to respond to emergencies See telemetry face sheet for immediately available ER MD    Location [facility]-Cardiac & Pulmonary Rehab    Staff Present Leita Franks RN,BSN;Joseph [facility] BS, Exercise Physiologist;Noah Tickle, BS, Exercise Physiologist;Meredith Tressa RN,BSN    Virtual Visit No    Medication changes reported     Yes    Comments stopped jardiance ; increased metoprolol  to 100 mg    Fall or balance concerns reported    No    Tobacco Cessation No Change    Warm-up and Cool-down Performed on first and last piece of equipment    Resistance Training Performed Yes    VAD Patient? No    PAD/SET Patient? No      Pain Assessment   Currently in Pain? No/denies             Tobacco Use History[1]  Goals Met:  Independence with exercise equipment Exercise tolerated well No report of concerns or symptoms today Strength training completed today  Goals Unmet:  Not Applicable  Comments: Pt able to follow exercise prescription today without complaint.  Will continue to monitor for progression.    Dr. Oneil Pinal is Medical Director for [facility] Cardiac Rehabilitation.  Dr. Fuad Aleskerov is Medical Director for [facility] Pulmonary Rehabilitation.    [1]  Social History Tobacco Use  Smoking Status Former   Current packs/day: 0.00   Types: Cigarettes   Quit date: 06/20/1989   Years since quitting: 35.1  Smokeless Tobacco Never  Tobacco Comments   smoked occasional cigarettes for 5 years

## 2024-08-16 ENCOUNTER — Encounter

## 2024-08-18 ENCOUNTER — Encounter

## 2024-08-23 ENCOUNTER — Encounter

## 2024-08-23 ENCOUNTER — Ambulatory Visit

## 2024-08-25 ENCOUNTER — Encounter
